# Patient Record
Sex: Female | Born: 1945 | Race: White | Hispanic: No | Marital: Married | Smoking: Former smoker
Health system: Southern US, Community
[De-identification: ages and names within clinical notes are randomized; demographics above are authoritative.]

## PROBLEM LIST (undated history)

## (undated) DIAGNOSIS — I10 Essential (primary) hypertension: Secondary | ICD-10-CM

## (undated) DIAGNOSIS — E119 Type 2 diabetes mellitus without complications: Secondary | ICD-10-CM

## (undated) DIAGNOSIS — M1990 Unspecified osteoarthritis, unspecified site: Secondary | ICD-10-CM

## (undated) DIAGNOSIS — K648 Other hemorrhoids: Secondary | ICD-10-CM

## (undated) DIAGNOSIS — Z973 Presence of spectacles and contact lenses: Secondary | ICD-10-CM

## (undated) DIAGNOSIS — I2510 Atherosclerotic heart disease of native coronary artery without angina pectoris: Secondary | ICD-10-CM

## (undated) DIAGNOSIS — D242 Benign neoplasm of left breast: Secondary | ICD-10-CM

## (undated) DIAGNOSIS — E785 Hyperlipidemia, unspecified: Secondary | ICD-10-CM

## (undated) HISTORY — DX: Essential (primary) hypertension: I10

## (undated) HISTORY — PX: CARDIAC CATHETERIZATION: SHX172

## (undated) HISTORY — DX: Hyperlipidemia, unspecified: E78.5

## (undated) HISTORY — PX: OTHER SURGICAL HISTORY: SHX169

---

## 1968-12-28 HISTORY — PX: LUMBAR SPINE SURGERY: SHX701

## 1978-12-29 HISTORY — PX: ABDOMINAL HYSTERECTOMY: SHX81

## 1993-04-29 HISTORY — PX: LAPAROSCOPIC CHOLECYSTECTOMY: SUR755

## 1997-12-28 ENCOUNTER — Ambulatory Visit (HOSPITAL_COMMUNITY): Admission: RE | Admit: 1997-12-28 | Discharge: 1997-12-28 | Payer: Self-pay | Admitting: Family Medicine

## 1999-08-31 ENCOUNTER — Other Ambulatory Visit: Admission: RE | Admit: 1999-08-31 | Discharge: 1999-08-31 | Payer: Self-pay | Admitting: *Deleted

## 2000-01-31 ENCOUNTER — Encounter: Payer: Self-pay | Admitting: Neurological Surgery

## 2000-01-31 ENCOUNTER — Ambulatory Visit (HOSPITAL_COMMUNITY): Admission: RE | Admit: 2000-01-31 | Discharge: 2000-01-31 | Payer: Self-pay | Admitting: Neurological Surgery

## 2000-02-06 ENCOUNTER — Encounter: Admission: RE | Admit: 2000-02-06 | Discharge: 2000-05-06 | Payer: Self-pay | Admitting: Neurological Surgery

## 2000-03-30 ENCOUNTER — Encounter: Payer: Self-pay | Admitting: Neurological Surgery

## 2000-03-30 ENCOUNTER — Ambulatory Visit (HOSPITAL_COMMUNITY): Admission: RE | Admit: 2000-03-30 | Discharge: 2000-03-30 | Payer: Self-pay | Admitting: Neurological Surgery

## 2000-04-15 ENCOUNTER — Encounter: Payer: Self-pay | Admitting: Neurological Surgery

## 2000-04-17 ENCOUNTER — Encounter: Payer: Self-pay | Admitting: Neurological Surgery

## 2000-04-17 ENCOUNTER — Ambulatory Visit (HOSPITAL_COMMUNITY): Admission: RE | Admit: 2000-04-17 | Discharge: 2000-04-17 | Payer: Self-pay | Admitting: Neurological Surgery

## 2000-05-19 ENCOUNTER — Encounter: Admission: RE | Admit: 2000-05-19 | Discharge: 2000-06-10 | Payer: Self-pay | Admitting: Neurological Surgery

## 2000-09-04 ENCOUNTER — Other Ambulatory Visit: Admission: RE | Admit: 2000-09-04 | Discharge: 2000-09-04 | Payer: Self-pay | Admitting: Family Medicine

## 2001-04-29 HISTORY — PX: PLANTAR FASCIA SURGERY: SHX746

## 2001-11-05 ENCOUNTER — Other Ambulatory Visit: Admission: RE | Admit: 2001-11-05 | Discharge: 2001-11-05 | Payer: Self-pay | Admitting: Family Medicine

## 2002-12-08 ENCOUNTER — Other Ambulatory Visit: Admission: RE | Admit: 2002-12-08 | Discharge: 2002-12-08 | Payer: Self-pay | Admitting: Family Medicine

## 2006-04-09 ENCOUNTER — Ambulatory Visit (HOSPITAL_BASED_OUTPATIENT_CLINIC_OR_DEPARTMENT_OTHER): Admission: RE | Admit: 2006-04-09 | Discharge: 2006-04-09 | Payer: Self-pay | Admitting: Orthopedic Surgery

## 2006-10-08 ENCOUNTER — Other Ambulatory Visit: Admission: RE | Admit: 2006-10-08 | Discharge: 2006-10-08 | Payer: Self-pay | Admitting: Family Medicine

## 2008-05-19 ENCOUNTER — Ambulatory Visit (HOSPITAL_COMMUNITY): Admission: RE | Admit: 2008-05-19 | Discharge: 2008-05-20 | Payer: Self-pay | Admitting: Neurological Surgery

## 2008-05-19 HISTORY — PX: ANTERIOR CERVICAL DECOMP/DISCECTOMY FUSION: SHX1161

## 2008-09-15 ENCOUNTER — Ambulatory Visit (HOSPITAL_BASED_OUTPATIENT_CLINIC_OR_DEPARTMENT_OTHER): Admission: RE | Admit: 2008-09-15 | Discharge: 2008-09-15 | Payer: Self-pay | Admitting: Orthopedic Surgery

## 2009-02-16 ENCOUNTER — Ambulatory Visit (HOSPITAL_BASED_OUTPATIENT_CLINIC_OR_DEPARTMENT_OTHER): Admission: RE | Admit: 2009-02-16 | Discharge: 2009-02-16 | Payer: Self-pay | Admitting: Orthopedic Surgery

## 2009-04-29 HISTORY — PX: CATARACT EXTRACTION W/ INTRAOCULAR LENS  IMPLANT, BILATERAL: SHX1307

## 2010-08-02 LAB — BASIC METABOLIC PANEL
BUN: 28 mg/dL — ABNORMAL HIGH (ref 6–23)
Chloride: 100 mEq/L (ref 96–112)
Glucose, Bld: 157 mg/dL — ABNORMAL HIGH (ref 70–99)
Potassium: 3.6 mEq/L (ref 3.5–5.1)

## 2010-08-07 LAB — BASIC METABOLIC PANEL
BUN: 23 mg/dL (ref 6–23)
Chloride: 103 mEq/L (ref 96–112)
Glucose, Bld: 145 mg/dL — ABNORMAL HIGH (ref 70–99)
Potassium: 3.3 mEq/L — ABNORMAL LOW (ref 3.5–5.1)

## 2010-08-13 LAB — CBC
HCT: 40.3 % (ref 36.0–46.0)
MCV: 83.3 fL (ref 78.0–100.0)
Platelets: 298 10*3/uL (ref 150–400)
RDW: 12.9 % (ref 11.5–15.5)

## 2010-08-13 LAB — BASIC METABOLIC PANEL
BUN: 20 mg/dL (ref 6–23)
CO2: 29 mEq/L (ref 19–32)
Chloride: 97 mEq/L (ref 96–112)
Glucose, Bld: 166 mg/dL — ABNORMAL HIGH (ref 70–99)
Potassium: 3.3 mEq/L — ABNORMAL LOW (ref 3.5–5.1)

## 2010-09-11 NOTE — Op Note (Signed)
[name], [name] NO.:  [id number]   MEDICAL RECORD NO.:  [id number]          PATIENT TYPE:  AMB   LOCATION:  DSC                          FACILITY:  [facility]   PHYSICIAN:  Loreta Ave, M.D. DATE OF BIRTH:  03-06-1946   DATE OF PROCEDURE:  09/15/2008  DATE OF DISCHARGE:                               OPERATIVE REPORT   PREOPERATIVE DIAGNOSES:  Right shoulder impingement, distal clavicle  osteolysis, long head biceps tendinopathy.   POSTOPERATIVE DIAGNOSES:  Right shoulder impingement, distal clavicle  osteolysis, long head biceps tendinopathy, but also with a full-  thickness tear of rotator cuff crescent region supraspinatus tendon.  Also, complex tearing of labrum circumferentially.  Also, some mild  adhesive capsulitis.   PROCEDURE:  Right shoulder exam manipulation under anesthesia.  Arthroscopy and debridement of labrum and rotator cuff.  Bursectomy,  acromioplasty, CA ligament release.  Excision of distal clavicle.  Arthroscopic-assisted rotator cuff repair with FiberWire suture x2,  swivel lock anchors x2.   SURGEON:  Loreta Ave, MD   ASSISTANT:  Genene Churn. Barry Dienes, [place], present throughout the entire case and  necessary for timely completion of procedure.   ANESTHESIA:  General.   BLOOD LOSS:  Minimal.   SPECIMENS:  None.   CULTURES:  None.   COMPLICATIONS:  None.   DRESSING:  Soft compressive shoulder immobilizer.   PROCEDURE:  The patient brought to the operating room, placed on  operating table in supine position.  After adequate anesthesia had been  obtained, shoulder examined.  Fairly good motion, but last 20% forward  flexion and abduction from some mild adhesions.  I gently manipulated  breaking those up, achieving full motion, maintaining stable shoulder.  She was then placed in beach-chair position on the shoulder positioner,  prepped and draped in usual sterile fashion.  Three portals anterior,  posterior, and lateral.   Shoulder entered with blunt obturator.  Arthroscope introduced, shoulder distended and inspected.  Circumferential complex tearing of labrum debrided, most of this  anterosuperior.  Biceps tendon had a little longitudinal split exiting  the shoulder but no inflammation, structurally intact, and no further  treatment of that was really indicated.  Articular cartilage looked  good.  Full-thickness tear, crescent region of supraspinatus tendon.  Debrided back to healthy tissue.  Tuberosity roughened with bur.  Cannula then redirected subacromially.  Type 3 acromion.  Obvious  impingement.  Bursa resected.  Acromioplasty to a type 1 acromion with  shaver and high-speed bur releasing the CA ligament with cautery.  Grade  4 changes with spurs of AC joint.  Periarticular spurs in lateral  centimeter of clavicle resected.  Adequacy of decompression confirmed  viewing from all portals.  The anterior and lateral portals were then  opened, cannula was placed for repair of her cuff.  Utilizing a Scorpion  device, 2 horizontal mattress sutures were placed in the cuff.  These  were brought out laterally and then anchored down into the tuberosity  with swivel lock anchors.  In the front, I was very pleased with but the  back anchor did not give a  secure fixation due to the bone being  relatively soft.  That suture was removed, swivel lock anchor left  buried in the bone.  This was not loose at all and retrieving it would  have done more harm than good.  I reinsert a second horizontal mattress  suture in the back of the cuff and then this was anchored down into the  humerus going over top of the tuberosity into the lateral aspect where  there was better bone.  I prepunched and then fixed with a swivel lock  there.  That did gave good firm fixation.  At completion, I had a nice  firm watertight closure of the cuff confirmed with full passive motion.  Instruments and fluid removed.  Portal of shoulder and  bursa injected  with Marcaine.  Portal was closed with 4-0 nylon.  Sterile compressive  dressing applied.  Shoulder immobilizer applied.  Anesthesia reversed.  Brought to recovery room.  Tolerated surgery well.  No complications.      Loreta Ave, M.D.  Electronically Signed     DFM/MEDQ  D:  09/15/2008  T:  09/16/2008  Job:  [id number]

## 2010-09-11 NOTE — Op Note (Signed)
[name], [name] NO.:  [id number]   MEDICAL RECORD NO.:  [id number]          PATIENT TYPE:  OIB   LOCATION:  3526                         FACILITY:  [facility]   PHYSICIAN:  Stefani Dama, M.D.  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   PROCEDURES:  Anterior cervical decompression C6-C7 arthrodesis with  structural allograft, Alphatec plate fixation at C6-C7.   SURGEON:  Stefani Dama, MD   FIRST ASSISTANT:  Clydene Fake, MD   ANESTHESIA:  General endotracheal.   INDICATIONS:  [name] is a 65 year old individual who has had  significant neck, shoulder, and right arm pain.  She has evidence of  herniated nucleus pulposus at C6-C7 on the right side.  She was noted to  have spondylosis at the C6-C7.  She is advised regarding anterior  cervical decompression arthrodesis.   PROCEDURE:  The patient was brought to the operating room and placed on  table in supine position.  After smooth induction of general  endotracheal anesthesia, she was placed in 5 pounds of halter traction.  The neck was prepped with alcohol and DuraPrep and draped in sterile  fashion.  A transverse incision was made in left side of the neck and  dissection was carried down through the platysma.  The plane between the  sternocleidomastoid and strap muscles were dissected bluntly until the  prevertebral space was reached, first identifiable disk space was noted  be that of C6-C7.  Dissection was then carried down under longus coli  muscle and self-retaining Caspar retractor was placed in the wound and  the anterior border of C6-C7 was opened  with #15 blade and a combination  of Kerrison rongeurs was used to evacuate a significant quantity of  severely desiccated disk material.  Osteophytes were noted on the  inferior margin body of C6.  Uncinate process spur was noted be  prominent on the right side at C6-C7.  These were drilled down, then  removed initially with 2-mm Kerrison punch followed by a larger 2-mm  Kerrison punch.  The lateral recess was well decompressed and the C7  nerve root was freed on both the right and left side.  Once this was  accomplished, hemostasis in the interspace was achieved.  A 5-mm Barrel  Bit was used to shape the endplates and smooth the edges.  A 7-mm  transgraft was then shaped informed to the interspaces configuration,  filled with demineralized bone matrix and then placed into the  interspace and countersunk approximately 2 mm.  Traction was removed at  this point and a standard-sized 14-mm Alphatec plate was fixed to the  ventral aspect of the vertebral bodies with four variable angled 4 x 14  mm screws.  Once these were placed, final localizing radiograph  identified  good position  of the surgical construct.  Platysma was closed with 3-0  Vicryl interrupted fashion and 3-0 Vicryl and the subcuticular tissues  and the Dermabond was placed on the skin.  Blood loss was estimated 30  mL.  The patient tolerated the procedure well.      Stefani Dama, M.D.  Electronically Signed     HJE/MEDQ  D:  05/19/2008  T:  05/20/2008  Job:  [id number]

## 2010-09-14 NOTE — Op Note (Signed)
[name], ANDRES NO.:  [id number]   MEDICAL RECORD NO.:  [id number]          PATIENT TYPE:  AMB   LOCATION:  DSC                          FACILITY:  [facility]   PHYSICIAN:  Loreta Ave, M.D. DATE OF BIRTH:  Mar 09, 1946   DATE OF PROCEDURE:  04/09/2006  DATE OF DISCHARGE:                               OPERATIVE REPORT   PREOPERATIVE DIAGNOSIS:  Recalcitrant chronic right plantar fasciitis.   POSTOPERATIVE DIAGNOSIS:  Recalcitrant chronic right plantar fasciitis.   DESCRIPTION OF PROCEDURE:  Endoscopic plantar fascial release, right  heel.   SURGEON:  Loreta Ave, M.D.   ASSISTANT SURGEON:  Genene Churn. Denton Meek.   ANESTHESIA:  General.   ESTIMATED BLOOD LOSS:  Minimal.   TOURNIQUET TIME:  30 minutes.   SPECIMENS:  None.   CULTURES:  None.   COMPLICATIONS:  None.   DRESSING:  Soft compressive.   DESCRIPTION OF PROCEDURE:  The patient was brought to the operating room  and, after adequate anesthesia had been obtained, a tourniquet was  applied, right calf.  Prepped and draped in the usual sterile fashion,  exsanguinated, elevation, Esmarch, tourniquet inflated to 250 mm Hg.  Fluoroscopic localization of the plantar fascia attachment to the os  calcis.  A small incision was made on the medial and lateral aspects.  Tissue cleared on the plantar side of the plantar fascia with a small  spatula, and then the cannula inserted for endoscopic release.  Utilizing arthroscopic guidance and the endoscopic instruments, the  plantar fascia was isolated and released at its attachment off the os  calcis in its entirety from medial to lateral aspect all the way down to  muscle layer below.  Neurovascular structures were protected.  Confirmed  complete release.  The wound was irrigated.  Injected with  Marcaine and closed with nylon.  A sterile compressive dressing was  applied.  The tourniquet was deflated and removed.  Anesthesia was  reversed.  The  patient was brought to the recovery room.  The patient  tolerated the surgery well with no complications.      Loreta Ave, M.D.  Electronically Signed     DFM/MEDQ  D:  04/09/2006  T:  04/10/2006  Job:  [id number]

## 2010-12-19 ENCOUNTER — Encounter (INDEPENDENT_AMBULATORY_CARE_PROVIDER_SITE_OTHER): Payer: Self-pay | Admitting: General Surgery

## 2010-12-24 ENCOUNTER — Ambulatory Visit (INDEPENDENT_AMBULATORY_CARE_PROVIDER_SITE_OTHER): Payer: Self-pay | Admitting: General Surgery

## 2011-01-07 ENCOUNTER — Ambulatory Visit (INDEPENDENT_AMBULATORY_CARE_PROVIDER_SITE_OTHER): Payer: Medicare Other | Admitting: General Surgery

## 2011-01-07 ENCOUNTER — Encounter (INDEPENDENT_AMBULATORY_CARE_PROVIDER_SITE_OTHER): Payer: Self-pay | Admitting: General Surgery

## 2011-01-07 VITALS — BP 128/86 | HR 72 | Temp 98.1°F | Ht 63.0 in | Wt 162.2 lb

## 2011-01-07 DIAGNOSIS — K648 Other hemorrhoids: Secondary | ICD-10-CM

## 2011-01-07 NOTE — Progress Notes (Signed)
Chief Complaint  Patient presents with  . Other    new pt- eval hems and anal fissure    HPI [name] is a 65 y.o. female.    [name] is referred to me by Dr. Laurann Montana for management of hemorrhoids.  The patient has had intermittent hemorrhoid symptoms for 40 years. She has not had any surgical interventions. The last 5 years have been worse. She is not constipated. She has episodes of diarrhea after eating nuts and this causes bleeding. She has a little bit of mild discomfort. This occurs every 2-4 weeks and will last 2 or 3 days and then she is normal in between. Recent stool Hemoccult cards were actually negative. She has had colonoscopy periodically.  She was referred because she is becoming more frustrated with recurrent symptoms. She has tried suppositories was used to help but now not as much. HPI  Past Medical History  Diagnosis Date  . Raynauds phenomenon   . Hypertension   . Hyperlipidemia   . Glaucoma   . Diverticulosis of colon   . Vitamin D deficiency   . Hemorrhoids     Past Surgical History  Procedure Date  . Rotator cuff repair 2011    bilateral  . Cevical fusion   . Abdominal hysterectomy   . Lumbar spine surgery   . Glaucoma surgery     bilateral  . Cholecystectomy 2002?    Family History  Problem Relation Age of Onset  . Hypertension Father   . Stroke Father   . Hypertension Sister   . Heart disease Brother   . Hypertension Brother   . Heart disease Brother   . Heart disease Sister   . Heart disease Sister   . Cancer Sister     breast    Social History History  Substance Use Topics  . Smoking status: Former Games developer  . Smokeless tobacco: Not on file  . Alcohol Use: No    Allergies  Allergen Reactions  . Vicodin (Hydrocodone-Acetaminophen) Swelling  . Crestor (Rosuvastatin Calcium)   . Lipitor (Atorvastatin Calcium)     Current Outpatient Prescriptions  Medication Sig Dispense Refill  . Colesevelam HCl 3.75 G PACK Take  by mouth.        Marland Kitchen lisinopril-hydrochlorothiazide (PRINZIDE,ZESTORETIC) 10-12.5 MG per tablet daily.      Marland Kitchen triamterene-hydrochlorothiazide (MAXZIDE) 75-50 MG per tablet Take 1 tablet by mouth daily.        . Vitamin D, Ergocalciferol, (DRISDOL) 50000 UNITS CAPS Take 50,000 Units by mouth.          Review of Systems Review of Systems  Constitutional: Negative.   HENT: Negative.   Eyes: Negative.   Respiratory: Negative.   Cardiovascular: Negative.   Gastrointestinal: Positive for diarrhea, anal bleeding and rectal pain. Negative for nausea, vomiting, abdominal pain, constipation, blood in stool and abdominal distention.  Genitourinary: Negative.   Musculoskeletal: Negative.        Raynaud's disease. Pos. Rheumatoid factor  Skin: Negative.   Neurological: Negative.   Hematological: Negative.   Psychiatric/Behavioral: Negative.     Blood pressure 128/86, pulse 72, temperature 98.1 F (36.7 C), temperature source Temporal, height 5\' 3"  (1.6 m), weight 162 lb 3.2 oz (73.573 kg).  Physical Exam Physical Exam  Constitutional: She is oriented to person, place, and time. She appears well-developed and well-nourished. No distress.  HENT:  Head: Normocephalic and atraumatic.  Nose: Nose normal.  Mouth/Throat: No oropharyngeal exudate.  Eyes: Conjunctivae and EOM are normal. No  scleral icterus.  Neck: Normal range of motion. Neck supple. No JVD present. No tracheal deviation present. No thyromegaly present.  Cardiovascular: Normal rate, regular rhythm, normal heart sounds and intact distal pulses.   No murmur heard. Pulmonary/Chest: Effort normal and breath sounds normal. No respiratory distress. She has no wheezes. She has no rales. She exhibits no tenderness.  Abdominal: Bowel sounds are normal. She exhibits no distension and no mass. There is no tenderness. There is no rebound and no guarding.    Genitourinary:     Musculoskeletal: Normal range of motion. She exhibits no edema  and no tenderness.  Lymphadenopathy:    She has no cervical adenopathy.  Neurological: She is alert and oriented to person, place, and time.  Skin: Skin is warm and dry. No rash noted. She is not diaphoretic. No erythema. No pallor.  Psychiatric: She has a normal mood and affect. Her behavior is normal. Judgment and thought content normal.    Data Reviewed I reviewed in Dr. Lucilla Lame office notes.  Assessment    Internal hemorrhoids with bleeding, injected today.  External hemorrhoids, without complication.  Hypertension.  Rate also, with positive rheumatoid factor.  Status post open hysterectomy without oophorectomy for fibroids, complicated by a bladder perforation which required repair.  History laparoscopic cholecystectomy. Details unknown.    Plan    Internal hemorrhoids injected with sclerosing solution today without apparent complication.  Patient information booklet given the patient and discussed.  Return to see me p.r.n. further symptoms.       [name] M 01/07/2011, 9:23 AM

## 2011-01-07 NOTE — Patient Instructions (Addendum)
I think that your bleeding is due to internal hemorrhoids. These were injected with sclerosing solution today. This should resolve the bleeding. Keep her stool soft. Return to see me if there are any recurrent problems. Discuss scheduling of future colonoscopy with your primary care physician.

## 2013-03-15 ENCOUNTER — Other Ambulatory Visit: Payer: Self-pay | Admitting: Family Medicine

## 2013-03-15 DIAGNOSIS — R0989 Other specified symptoms and signs involving the circulatory and respiratory systems: Secondary | ICD-10-CM

## 2013-03-22 ENCOUNTER — Ambulatory Visit
Admission: RE | Admit: 2013-03-22 | Discharge: 2013-03-22 | Disposition: A | Discharge status: Home / Self Care | Payer: Medicare Other | Source: Ambulatory Visit | Attending: Family Medicine | Admitting: Family Medicine

## 2013-03-22 DIAGNOSIS — R0989 Other specified symptoms and signs involving the circulatory and respiratory systems: Secondary | ICD-10-CM

## 2014-02-21 ENCOUNTER — Other Ambulatory Visit (INDEPENDENT_AMBULATORY_CARE_PROVIDER_SITE_OTHER): Payer: Self-pay | Admitting: General Surgery

## 2014-02-21 NOTE — H&P (Signed)
[name] 02/21/2014 1:35 PM Location: [facility] Patient #: [id number] DOB: 30-Sep-1945 Married / Language: [name] / Race: White Female History of Present Illness [name]; 22/97/9892 2:22 PM) Patient words: bleeding hems.  The patient is a 68 year old female who presents with a complaint of rectal bleeding. The onset of the rectal bleeding has been gradual and has been occurring in a persistent pattern. The course has been increasing. She reports several episodes a week. The rectal bleeding is characterized as blood mixed in stools. Her stools are mostly loose. She denies any constipation. Her fiber intake is dietary. There has been no associated abdominal pain, change in bowel habits or change in caliber of stools. She underwent proctoscopy and sclerosing by Dr Dalbert Batman 3 yrs ago with little results. Most recent colonoscopy several months ago was negative. Other Problems [name]; 02/21/2014 1:38 PM) Back Pain Diabetes Mellitus Hemorrhoids High blood pressure Hypercholesterolemia  Past Surgical History [name]; 02/21/2014 1:38 PM) Cataract Surgery Bilateral. Colon Polyp Removal - Colonoscopy Foot Surgery Right. Gallbladder Surgery - Laparoscopic Hysterectomy (not due to cancer) - Partial Shoulder Surgery Bilateral. Spinal Surgery - Lower Back Spinal Surgery - Neck  Diagnostic Studies History [name]; 02/21/2014 1:38 PM) Colonoscopy within last year Mammogram within last year Pap Smear >5 years ago  Allergies [name]; 02/21/2014 1:36 PM) Vicodin *ANALGESICS - OPIOID* Lipitor *ANTIHYPERLIPIDEMICS* Crestor *ANTIHYPERLIPIDEMICS*  Medication History [name]; 02/21/2014 1:38 PM) Vitamin D (Ergocalciferol) (50000UNIT Capsule, Oral weekly) Active. Neomycin-Polymyxin-HC (3.5-10000-1 Suspension, Otic as needed) Active. Lisinopril-Hydrochlorothiazide (20-12.5MG  Tablet, Oral daily)  Active. Diltiazem CD (240MG  Capsule ER 24HR, Oral) Active. Zetia (10MG  Tablet, Oral daily) Active. Baby Aspirin (81MG  Tablet Chewable, Oral) Active.  Social History [name]; 02/21/2014 1:38 PM) Alcohol use Occasional alcohol use. Caffeine use Coffee, Tea. No drug use Tobacco use Former smoker.  Family History [name]; 02/21/2014 1:38 PM) Alcohol Abuse Brother. Breast Cancer Sister. Cerebrovascular Accident Father. Heart Disease Brother, Father, Sister. Heart disease in female family member before age 47 Hypertension Father, Sister. Melanoma Sister.  Pregnancy / Birth History [name]; 02/21/2014 1:38 PM) Age at menarche 17 years. Age of menopause 65-50 Contraceptive History Intrauterine device. Gravida 1 Maternal age 74-25 Para 1 Regular periods     Review of Systems Apolonio Schneiders Oliver; 02/21/2014 1:38 PM) General Not Present- Appetite Loss, Chills, Fatigue, Fever, Night Sweats, Weight Gain and Weight Loss. Skin Present- Dryness. Not Present- Change in Wart/Mole, Hives, Jaundice, New Lesions, Non-Healing Wounds, Rash and Ulcer. HEENT Present- Seasonal Allergies and Wears glasses/contact lenses. Not Present- Earache, Hearing Loss, Hoarseness, Nose Bleed, Oral Ulcers, Ringing in the Ears, Sinus Pain, Sore Throat, Visual Disturbances and Yellow Eyes. Respiratory Present- Snoring. Not Present- Bloody sputum, Chronic Cough, Difficulty Breathing and Wheezing. Breast Not Present- Breast Mass, Breast Pain, Nipple Discharge and Skin Changes. Cardiovascular Not Present- Chest Pain, Difficulty Breathing Lying Down, Leg Cramps, Palpitations, Rapid Heart Rate, Shortness of Breath and Swelling of Extremities. Gastrointestinal Present- Hemorrhoids and Rectal Pain. Not Present- Abdominal Pain, Bloating, Bloody Stool, Change in Bowel Habits, Chronic diarrhea, Constipation, Difficulty Swallowing, Excessive gas, Gets full quickly at meals, Indigestion,  Nausea and Vomiting. Female Genitourinary Present- Frequency. Not Present- Nocturia, Painful Urination, Pelvic Pain and Urgency. Musculoskeletal Present- Joint Stiffness and Muscle Pain. Not Present- Back Pain, Joint Pain, Muscle Weakness and Swelling of Extremities. Neurological Not Present- Decreased Memory, Fainting, Headaches, Numbness, Seizures, Tingling, Tremor, Trouble walking and Weakness. Psychiatric Not Present- Anxiety, Bipolar, Change in Sleep Pattern, Depression, Fearful and Frequent crying.  Endocrine Present- New Diabetes. Not Present- Cold Intolerance, Excessive Hunger, Hair Changes, Heat Intolerance and Hot flashes. Hematology Not Present- Easy Bruising, Excessive bleeding, Gland problems, HIV and Persistent Infections.  Vitals Apolonio Schneiders [place]; 02/21/2014 1:39 PM) 02/21/2014 1:39 PM Weight: 153 lb Height: 63in Body Surface Area: 1.76 m Body Mass Index: 27.1 kg/m Temp.: 74F(Tympanic)  Pulse: 66 (Regular)  BP: 120/72 (Sitting, Left Arm, Standard)     Physical Exam [name]; 57/84/6962 2:24 PM)  General Mental Status-Alert. General Appearance-Not in acute distress. Orientation-Oriented X3. Hydration-Well hydrated. Voice-Normal.  Integumentary Global Assessment Upon inspection and palpation of skin surfaces of the - Axillae: non-tender, no inflammation or ulceration, no drainage. and Distribution of scalp and body hair is normal. General Characteristics Temperature - normal warmth is noted.  Head and Neck Head-normocephalic, atraumatic with no lesions or palpable masses. Face Global Assessment - atraumatic, no absence of expression. Neck Global Assessment - no abnormal movements, no bruit auscultated on the right, no bruit auscultated on the left, no decreased range of motion, non-tender. Trachea-midline. Thyroid Gland Characteristics - non-tender.  ENMT Nose and Sinuses External Inspection of the Nose - no destructive  lesion observed. Inspection of the nares - Left - quiet respiration. Right - quiet respiration. Mouth and Throat Lips - Upper Lip - no fissures observed, no pallor noted. Lower Lip - no fissures observed, no pallor noted. Nasopharynx - no discharge present. Oral Cavity/Oropharynx - Tongue - no dryness observed. Oral Mucosa - no cyanosis observed. Hypopharynx - no evidence of airway distress observed.  Chest and Lung Exam Inspection Movements - Normal and Symmetrical. Accessory muscles - No use of accessory muscles in breathing. Palpation Palpation of the chest reveals - Non-tender. Auscultation Breath sounds - Normal and Clear.  Cardiovascular Auscultation Rhythm - Regular. Murmurs & Other Heart Sounds - Auscultation of the heart reveals - No Murmurs and No Systolic Clicks.  Abdomen Inspection Inspection of the abdomen reveals - No Visible peristalsis and No Abnormal pulsations. Palpation/Percussion Palpation and Percussion of the abdomen reveal - Soft, Non Tender, No Rebound tenderness, No Rigidity (guarding) and No Cutaneous hyperesthesia.  Rectal Anorectal Exam External - normal external exam. Internal - normal internal exam. Proctoscopic exam -Note:see procedure note.   Peripheral Vascular Upper Extremity Inspection - Left - No Cyanotic nailbeds, Not Ischemic. Right - No Cyanotic nailbeds, Not Ischemic.  Neurologic Neurologic evaluation reveals -normal attention span and ability to concentrate, able to name objects and repeat phrases. Appropriate fund of knowledge , normal sensation and normal coordination. Mental Status Affect - not angry, not paranoid. Cranial Nerves-Normal Bilaterally. Gait-Normal.  Neuropsychiatric Mental status exam performed with findings of-able to articulate well with normal speech/language, rate, volume and coherence, thought content normal with ability to perform basic computations and apply abstract reasoning and no evidence of  hallucinations, delusions, obsessions or homicidal/suicidal ideation.    Assessment & Plan [name]; 95/28/4132 2:22 PM)  BLEEDING INTERNAL HEMORRHOIDS (455.2  K64.8) Story: this is a 68 year old female with bleeding internal hemorrhoids. Recent colonoscopy has been negative for any other source of bleeding. She reports mainly loose stools. She denies any constipation or frank diarrhea. She's tried fiber supplements and rectal suppositories with no relief in symptoms. Impression: on exam she has a grade 2 inflamed internal hemorrhoid. We discussed office banding procedures versus operative procedures. After discussing the risks and benefits of both, she has decided to undergo an operative hemorrhoidectomy. Risks include bleeding, pain and recurrence.  Current Plans Pt Education - [facility] Hemorrhoids (  AT) ANOSCOPY, DIAGNOSTIC (91694)  Hemorrhoids Procedure  Other: Procedure: Anoscopy Surgeon: Marcello Moores Assistant: Juliane Lack After the risks and benefits were explained, verbal consent was obtained for above procedure Anesthesia: none Diagnosis: rectal bleeding Findings: Grade 2 R posterior internal hemorrhoids with signs of inflammation and bleeding

## 2014-02-28 ENCOUNTER — Encounter (HOSPITAL_BASED_OUTPATIENT_CLINIC_OR_DEPARTMENT_OTHER): Payer: Self-pay | Admitting: *Deleted

## 2014-03-02 ENCOUNTER — Other Ambulatory Visit: Payer: Self-pay

## 2014-03-02 ENCOUNTER — Encounter (HOSPITAL_BASED_OUTPATIENT_CLINIC_OR_DEPARTMENT_OTHER)
Admission: RE | Disposition: A | Discharge status: Home / Self Care | Payer: Self-pay | Source: Ambulatory Visit | Attending: General Surgery

## 2014-03-02 ENCOUNTER — Ambulatory Visit (HOSPITAL_BASED_OUTPATIENT_CLINIC_OR_DEPARTMENT_OTHER): Payer: Medicare Other | Admitting: Anesthesiology

## 2014-03-02 ENCOUNTER — Emergency Department (HOSPITAL_COMMUNITY): Payer: Medicare Other

## 2014-03-02 ENCOUNTER — Ambulatory Visit (HOSPITAL_BASED_OUTPATIENT_CLINIC_OR_DEPARTMENT_OTHER)
Admission: RE | Admit: 2014-03-02 | Discharge: 2014-03-02 | Disposition: A | Discharge status: Home / Self Care | Payer: Medicare Other | Source: Ambulatory Visit | Attending: General Surgery | Admitting: General Surgery

## 2014-03-02 ENCOUNTER — Encounter (HOSPITAL_COMMUNITY): Payer: Self-pay

## 2014-03-02 ENCOUNTER — Inpatient Hospital Stay (HOSPITAL_COMMUNITY)
Admission: EM | Admit: 2014-03-02 | Discharge: 2014-03-03 | DRG: 989 | Disposition: A | Discharge status: Home / Self Care | Payer: Medicare Other | Attending: Internal Medicine | Admitting: Internal Medicine

## 2014-03-02 ENCOUNTER — Encounter (HOSPITAL_BASED_OUTPATIENT_CLINIC_OR_DEPARTMENT_OTHER): Payer: Self-pay | Admitting: Anesthesiology

## 2014-03-02 DIAGNOSIS — G459 Transient cerebral ischemic attack, unspecified: Secondary | ICD-10-CM | POA: Diagnosis present

## 2014-03-02 DIAGNOSIS — E559 Vitamin D deficiency, unspecified: Secondary | ICD-10-CM | POA: Diagnosis present

## 2014-03-02 DIAGNOSIS — E669 Obesity, unspecified: Secondary | ICD-10-CM | POA: Diagnosis present

## 2014-03-02 DIAGNOSIS — E119 Type 2 diabetes mellitus without complications: Secondary | ICD-10-CM | POA: Diagnosis present

## 2014-03-02 DIAGNOSIS — Z6827 Body mass index (BMI) 27.0-27.9, adult: Secondary | ICD-10-CM

## 2014-03-02 DIAGNOSIS — I639 Cerebral infarction, unspecified: Secondary | ICD-10-CM | POA: Diagnosis present

## 2014-03-02 DIAGNOSIS — E1169 Type 2 diabetes mellitus with other specified complication: Secondary | ICD-10-CM

## 2014-03-02 DIAGNOSIS — Z7982 Long term (current) use of aspirin: Secondary | ICD-10-CM

## 2014-03-02 DIAGNOSIS — E785 Hyperlipidemia, unspecified: Secondary | ICD-10-CM | POA: Diagnosis present

## 2014-03-02 DIAGNOSIS — Z823 Family history of stroke: Secondary | ICD-10-CM

## 2014-03-02 DIAGNOSIS — Z9841 Cataract extraction status, right eye: Secondary | ICD-10-CM | POA: Diagnosis not present

## 2014-03-02 DIAGNOSIS — R200 Anesthesia of skin: Secondary | ICD-10-CM

## 2014-03-02 DIAGNOSIS — Z9842 Cataract extraction status, left eye: Secondary | ICD-10-CM | POA: Diagnosis not present

## 2014-03-02 DIAGNOSIS — I10 Essential (primary) hypertension: Secondary | ICD-10-CM | POA: Diagnosis present

## 2014-03-02 DIAGNOSIS — Z961 Presence of intraocular lens: Secondary | ICD-10-CM | POA: Diagnosis present

## 2014-03-02 DIAGNOSIS — K648 Other hemorrhoids: Secondary | ICD-10-CM | POA: Diagnosis present

## 2014-03-02 DIAGNOSIS — Z87891 Personal history of nicotine dependence: Secondary | ICD-10-CM | POA: Diagnosis not present

## 2014-03-02 DIAGNOSIS — Z90710 Acquired absence of both cervix and uterus: Secondary | ICD-10-CM

## 2014-03-02 HISTORY — DX: Type 2 diabetes mellitus without complications: E11.9

## 2014-03-02 HISTORY — DX: Other hemorrhoids: K64.8

## 2014-03-02 HISTORY — DX: Presence of spectacles and contact lenses: Z97.3

## 2014-03-02 HISTORY — PX: HEMORRHOID SURGERY: SHX153

## 2014-03-02 LAB — CBC
HEMATOCRIT: 45 % (ref 36.0–46.0)
Hemoglobin: 15.3 g/dL — ABNORMAL HIGH (ref 12.0–15.0)
MCH: 28.7 pg (ref 26.0–34.0)
MCHC: 34 g/dL (ref 30.0–36.0)
MCV: 84.4 fL (ref 78.0–100.0)
Platelets: 213 10*3/uL (ref 150–400)
RBC: 5.33 MIL/uL — ABNORMAL HIGH (ref 3.87–5.11)
RDW: 12.8 % (ref 11.5–15.5)
WBC: 8.6 10*3/uL (ref 4.0–10.5)

## 2014-03-02 LAB — RAPID URINE DRUG SCREEN, HOSP PERFORMED
Amphetamines: NOT DETECTED
BARBITURATES: NOT DETECTED
BENZODIAZEPINES: NOT DETECTED
Cocaine: NOT DETECTED
Opiates: NOT DETECTED
TETRAHYDROCANNABINOL: NOT DETECTED

## 2014-03-02 LAB — POCT I-STAT, CHEM 8
BUN: 21 mg/dL (ref 6–23)
CREATININE: 0.8 mg/dL (ref 0.50–1.10)
Calcium, Ion: 1.29 mmol/L (ref 1.13–1.30)
Chloride: 104 mEq/L (ref 96–112)
Glucose, Bld: 124 mg/dL — ABNORMAL HIGH (ref 70–99)
HCT: 51 % — ABNORMAL HIGH (ref 36.0–46.0)
Hemoglobin: 17.3 g/dL — ABNORMAL HIGH (ref 12.0–15.0)
POTASSIUM: 3.6 meq/L — AB (ref 3.7–5.3)
Sodium: 144 mEq/L (ref 137–147)
TCO2: 26 mmol/L (ref 0–100)

## 2014-03-02 LAB — BASIC METABOLIC PANEL
Anion gap: 14 (ref 5–15)
BUN: 24 mg/dL — AB (ref 6–23)
CALCIUM: 9.9 mg/dL (ref 8.4–10.5)
CO2: 25 mEq/L (ref 19–32)
CREATININE: 1.05 mg/dL (ref 0.50–1.10)
Chloride: 100 mEq/L (ref 96–112)
GFR calc Af Amer: 61 mL/min — ABNORMAL LOW (ref >90)
GFR, EST NON AFRICAN AMERICAN: 53 mL/min — AB (ref >90)
GLUCOSE: 316 mg/dL — AB (ref 70–99)
Potassium: 4 mEq/L (ref 3.7–5.3)
Sodium: 139 mEq/L (ref 137–147)

## 2014-03-02 LAB — GLUCOSE, CAPILLARY: Glucose-Capillary: 98 mg/dL (ref 70–99)

## 2014-03-02 LAB — I-STAT TROPONIN, ED: Troponin i, poc: 0 ng/mL (ref 0.00–0.08)

## 2014-03-02 SURGERY — HEMORRHOIDECTOMY
Anesthesia: Monitor Anesthesia Care | Site: Rectum

## 2014-03-02 MED ORDER — KETOROLAC TROMETHAMINE 30 MG/ML IJ SOLN
INTRAMUSCULAR | Status: DC | PRN
  Administered 2014-03-02: 15 mg via INTRAVENOUS

## 2014-03-02 MED ORDER — LIDOCAINE 5 % EX OINT
TOPICAL_OINTMENT | CUTANEOUS | Status: DC | PRN
  Administered 2014-03-02: 1

## 2014-03-02 MED ORDER — SODIUM CHLORIDE 0.9 % IJ SOLN
3.0000 mL | INTRAMUSCULAR | Status: DC | PRN
  Filled 2014-03-02: qty 3

## 2014-03-02 MED ORDER — HEPARIN SODIUM (PORCINE) 5000 UNIT/ML IJ SOLN
5000.0000 [IU] | Freq: Three times a day (TID) | INTRAMUSCULAR | Status: DC
  Filled 2014-03-02: qty 1

## 2014-03-02 MED ORDER — ASPIRIN EC 81 MG PO TBEC
81.0000 mg | DELAYED_RELEASE_TABLET | Freq: Every morning | ORAL | Status: DC
  Administered 2014-03-03: 81 mg via ORAL
  Filled 2014-03-02: qty 1

## 2014-03-02 MED ORDER — SENNOSIDES-DOCUSATE SODIUM 8.6-50 MG PO TABS: 1.0000 | ORAL_TABLET | Freq: Every evening | ORAL | Status: DC | PRN

## 2014-03-02 MED ORDER — FENTANYL CITRATE 0.05 MG/ML IJ SOLN
INTRAMUSCULAR | Status: DC | PRN
  Administered 2014-03-02: 100 ug via INTRAVENOUS

## 2014-03-02 MED ORDER — TRAMADOL HCL 50 MG PO TABS
50.0000 mg | ORAL_TABLET | Freq: Once | ORAL | Status: AC
  Administered 2014-03-02: 50 mg via ORAL
  Filled 2014-03-02: qty 1

## 2014-03-02 MED ORDER — TRAMADOL HCL 50 MG PO TABS
50.0000 mg | ORAL_TABLET | Freq: Four times a day (QID) | ORAL | Status: DC | PRN
  Administered 2014-03-03: 100 mg via ORAL
  Filled 2014-03-02: qty 2

## 2014-03-02 MED ORDER — EZETIMIBE 10 MG PO TABS
10.0000 mg | ORAL_TABLET | Freq: Every morning | ORAL | Status: DC
  Administered 2014-03-03: 10 mg via ORAL
  Filled 2014-03-02: qty 1

## 2014-03-02 MED ORDER — ONDANSETRON HCL 4 MG/2ML IJ SOLN
INTRAMUSCULAR | Status: DC | PRN
  Administered 2014-03-02: 4 mg via INTRAVENOUS

## 2014-03-02 MED ORDER — PROMETHAZINE HCL 25 MG/ML IJ SOLN
6.2500 mg | INTRAMUSCULAR | Status: DC | PRN
  Filled 2014-03-02: qty 1

## 2014-03-02 MED ORDER — LIDOCAINE HCL (CARDIAC) 20 MG/ML IV SOLN
INTRAVENOUS | Status: DC | PRN
  Administered 2014-03-02: 60 mg via INTRAVENOUS

## 2014-03-02 MED ORDER — SODIUM CHLORIDE 0.9 % IJ SOLN
3.0000 mL | Freq: Two times a day (BID) | INTRAMUSCULAR | Status: DC
  Filled 2014-03-02: qty 3

## 2014-03-02 MED ORDER — SODIUM CHLORIDE 0.9 % IR SOLN
Status: DC | PRN
Start: 2014-03-02 — End: 2014-03-02
  Administered 2014-03-02: 500 mL

## 2014-03-02 MED ORDER — PROPOFOL 10 MG/ML IV BOLUS
INTRAVENOUS | Status: DC | PRN
  Administered 2014-03-02: 40 mg via INTRAVENOUS

## 2014-03-02 MED ORDER — BUPIVACAINE-EPINEPHRINE 0.5% -1:200000 IJ SOLN
INTRAMUSCULAR | Status: DC | PRN
  Administered 2014-03-02: 30 mL

## 2014-03-02 MED ORDER — ACETAMINOPHEN 10 MG/ML IV SOLN
INTRAVENOUS | Status: DC | PRN
  Administered 2014-03-02: 1000 mg via INTRAVENOUS

## 2014-03-02 MED ORDER — FENTANYL CITRATE 0.05 MG/ML IJ SOLN
25.0000 ug | INTRAMUSCULAR | Status: DC | PRN
  Filled 2014-03-02: qty 1

## 2014-03-02 MED ORDER — VITAMIN D3 25 MCG (1000 UNIT) PO TABS: 5000.0000 [IU] | ORAL_TABLET | ORAL | Status: DC

## 2014-03-02 MED ORDER — LACTATED RINGERS IV SOLN
INTRAVENOUS | Status: DC
  Administered 2014-03-02: 10:00:00 via INTRAVENOUS
  Filled 2014-03-02: qty 1000

## 2014-03-02 MED ORDER — FENTANYL CITRATE 0.05 MG/ML IJ SOLN
INTRAMUSCULAR | Status: AC
  Filled 2014-03-02: qty 4

## 2014-03-02 MED ORDER — STROKE: EARLY STAGES OF RECOVERY BOOK
Freq: Once | Status: DC
  Filled 2014-03-02: qty 1

## 2014-03-02 MED ORDER — TRAMADOL HCL 50 MG PO TABS: 50.0000 mg | ORAL_TABLET | Freq: Four times a day (QID) | ORAL | Status: DC | PRN

## 2014-03-02 MED ORDER — SODIUM CHLORIDE 0.9 % IV SOLN
250.0000 mL | INTRAVENOUS | Status: DC | PRN
  Filled 2014-03-02: qty 250

## 2014-03-02 MED ORDER — PROPOFOL 10 MG/ML IV EMUL
INTRAVENOUS | Status: DC | PRN
  Administered 2014-03-02: 50 ug/kg/min via INTRAVENOUS

## 2014-03-02 MED ORDER — DEXAMETHASONE SODIUM PHOSPHATE 4 MG/ML IJ SOLN
INTRAMUSCULAR | Status: DC | PRN
  Administered 2014-03-02: 10 mg via INTRAVENOUS

## 2014-03-02 SURGICAL SUPPLY — 40 items
BLADE HEX COATED 2.75 (ELECTRODE) ×2 IMPLANT
BLADE SURG 10 STRL SS (BLADE) IMPLANT
BRIEF STRETCH FOR OB PAD LRG (UNDERPADS AND DIAPERS) ×2 IMPLANT
CLOTH BEACON ORANGE TIMEOUT ST (SAFETY) ×2 IMPLANT
COVER MAYO STAND STRL (DRAPES) ×2 IMPLANT
COVER TABLE BACK 60X90 (DRAPES) ×2 IMPLANT
DRAPE PED LAPAROTOMY (DRAPES) IMPLANT
DRAPE UTILITY XL STRL (DRAPES) ×2 IMPLANT
DRSG PAD ABDOMINAL 8X10 ST (GAUZE/BANDAGES/DRESSINGS) ×2 IMPLANT
ELECT BLADE 6.5 .24CM SHAFT (ELECTRODE) IMPLANT
ELECT REM PT RETURN 9FT ADLT (ELECTROSURGICAL) ×2
ELECTRODE REM PT RTRN 9FT ADLT (ELECTROSURGICAL) ×1 IMPLANT
GAUZE SPONGE 4X4 16PLY XRAY LF (GAUZE/BANDAGES/DRESSINGS) ×2 IMPLANT
GLOVE BIO SURGEON STRL SZ 6.5 (GLOVE) ×2 IMPLANT
GLOVE INDICATOR 7.0 STRL GRN (GLOVE) ×2 IMPLANT
GOWN STRL REUS W/TWL 2XL LVL3 (GOWN DISPOSABLE) ×2 IMPLANT
NEEDLE HYPO 22GX1.5 SAFETY (NEEDLE) ×2 IMPLANT
NEEDLE HYPO 25X1 1.5 SAFETY (NEEDLE) IMPLANT
NS IRRIG 500ML POUR BTL (IV SOLUTION) ×2 IMPLANT
PACK BASIN DAY SURGERY FS (CUSTOM PROCEDURE TRAY) ×2 IMPLANT
PAD ABD 8X10 STRL (GAUZE/BANDAGES/DRESSINGS) ×2 IMPLANT
PENCIL BUTTON HOLSTER BLD 10FT (ELECTRODE) ×2 IMPLANT
SPONGE GAUZE 4X4 12PLY (GAUZE/BANDAGES/DRESSINGS) ×2 IMPLANT
SPONGE GAUZE 4X4 12PLY STER LF (GAUZE/BANDAGES/DRESSINGS) ×2 IMPLANT
SPONGE SURGIFOAM ABS GEL 100 (HEMOSTASIS) IMPLANT
SPONGE SURGIFOAM ABS GEL 12-7 (HEMOSTASIS) IMPLANT
STAPLER VISISTAT 35W (STAPLE) IMPLANT
SUT CHROMIC 2 0 SH (SUTURE) IMPLANT
SUT CHROMIC 3 0 SH 27 (SUTURE) IMPLANT
SUT PROLENE 2 0 BLUE (SUTURE) IMPLANT
SUT VIC AB 2-0 SH 27 (SUTURE) ×1
SUT VIC AB 2-0 SH 27XBRD (SUTURE) ×1 IMPLANT
SUT VIC AB 4-0 P-3 18XBRD (SUTURE) IMPLANT
SUT VIC AB 4-0 P3 18 (SUTURE)
SUT VIC AB 4-0 SH 18 (SUTURE) IMPLANT
SYR CONTROL 10ML LL (SYRINGE) ×2 IMPLANT
TRAY DSU PREP LF (CUSTOM PROCEDURE TRAY) ×2 IMPLANT
TUBE CONNECTING 12X1/4 (SUCTIONS) ×2 IMPLANT
WATER STERILE IRR 500ML POUR (IV SOLUTION) IMPLANT
YANKAUER SUCT BULB TIP NO VENT (SUCTIONS) ×2 IMPLANT

## 2014-03-02 NOTE — Anesthesia Preprocedure Evaluation (Addendum)
Anesthesia Evaluation  Patient identified by MRN, date of birth, ID band Patient awake    Reviewed: Allergy & Precautions, H&P , NPO status , Patient's Chart, lab work & pertinent test results  Airway Mallampati: II  TM Distance: >3 FB Neck ROM: Full    Dental no notable dental hx.    Pulmonary neg pulmonary ROS, former smoker,  breath sounds clear to auscultation  Pulmonary exam normal       Cardiovascular hypertension, Pt. on medications Rhythm:Regular Rate:Normal     Neuro/Psych negative neurological ROS  negative psych ROS   GI/Hepatic negative GI ROS, Neg liver ROS,   Endo/Other  diabetes, Type 2  Renal/GU negative Renal ROS  negative genitourinary   Musculoskeletal negative musculoskeletal ROS (+)   Abdominal   Peds negative pediatric ROS (+)  Hematology negative hematology ROS (+)   Anesthesia Other Findings   Reproductive/Obstetrics negative OB ROS                            Anesthesia Physical Anesthesia Plan  ASA: II  Anesthesia Plan: MAC   Post-op Pain Management:    Induction: Intravenous  Airway Management Planned: Simple Face Mask  Additional Equipment:   Intra-op Plan:   Post-operative Plan:   Informed Consent: I have reviewed the patients History and Physical, chart, labs and discussed the procedure including the risks, benefits and alternatives for the proposed anesthesia with the patient or authorized representative who has indicated his/her understanding and acceptance.   Dental advisory given  Plan Discussed with: CRNA and Surgeon  Anesthesia Plan Comments:         Anesthesia Quick Evaluation

## 2014-03-02 NOTE — ED Provider Notes (Signed)
CSN: [id number]     Arrival date & time 03/02/14  1821 History   First MD Initiated Contact with Patient 03/02/14 2026     Chief Complaint  Patient presents with  . Hypertension     (Consider location/radiation/quality/duration/timing/severity/associated sxs/prior Treatment) HPI Comments: Had episode of R arm and R leg numbness that lasted about 5 minutes this morning. Possible associated weakness at that time.   Patient is a 68 y.o. female presenting with hypertension and neurologic complaint. The history is provided by the patient.  Hypertension Pertinent negatives include no abdominal pain and no shortness of breath.  Neurologic Problem This is a new problem. The current episode started 6 to 12 hours ago. Episode frequency: one episode. The problem has not changed since onset.Pertinent negatives include no abdominal pain and no shortness of breath. Nothing aggravates the symptoms.    Past Medical History  Diagnosis Date  . Hypertension   . Hyperlipidemia   . Vitamin D deficiency   . Type 2 diabetes, diet controlled   . Bleeding internal hemorrhoids   . Wears glasses    Past Surgical History  Procedure Laterality Date  . Lumbar spine surgery  1970's  . Abdominal hysterectomy  1980's  . Laparoscopic cholecystectomy  1995  . Plantar fascia surgery Right 2003  . Anterior cervical decomp/discectomy fusion  05-19-2008    C6 -- C7  . Shoulder arthroscopy/  debridement labrum and rotator cuff/ bursectomy/ acromioplasty/  cal release/  excision distal clavical Bilateral right 09-15-2008/   left  10-17-2008  . Cataract extraction w/ intraocular lens  implant, bilateral  2011   Family History  Problem Relation Age of Onset  . Hypertension Father   . Stroke Father   . Hypertension Sister   . Heart disease Brother   . Hypertension Brother   . Heart disease Brother   . Heart disease Sister   . Heart disease Sister   . Cancer Sister     breast   History  Substance Use Topics   . Smoking status: Former Smoker -- 1.50 packs/day for 30 years    Types: Cigarettes    Quit date: 02/29/1992  . Smokeless tobacco: Never Used  . Alcohol Use: No   OB History    No data available     Review of Systems  Constitutional: Negative for fever.  Respiratory: Negative for cough and shortness of breath.   Gastrointestinal: Negative for vomiting and abdominal pain.  All other systems reviewed and are negative.     Allergies  Vicodin; Crestor; and Lipitor  Home Medications   Prior to Admission medications   Medication Sig Start Date End Date Taking? Authorizing Provider  aspirin EC 81 MG tablet Take 81 mg by mouth every morning.   Yes Historical Provider, MD  Cholecalciferol (VITAMIN D3) 50000 UNITS CAPS Take 1 capsule by mouth once a week.   Yes Historical Provider, MD  diltiazem (DILT-XR) 240 MG 24 hr capsule Take 240 mg by mouth every morning.   Yes Historical Provider, MD  ezetimibe (ZETIA) 10 MG tablet Take 10 mg by mouth every morning.   Yes Historical Provider, MD  lisinopril-hydrochlorothiazide (PRINZIDE,ZESTORETIC) 20-12.5 MG per tablet Take 1 tablet by mouth every morning.   Yes Historical Provider, MD  traMADol (ULTRAM) 50 MG tablet Take 1-2 tablets (50-100 mg total) by mouth every 6 (six) hours as needed. 25/8/52  Yes Leighton Ruff, MD   BP 172/76 mmHg  Pulse 83  Temp(Src) 98.2 F (36.8 C) (Oral)  Resp 19  SpO2 92% Physical Exam  Constitutional: She is oriented to person, place, and time. She appears well-developed and well-nourished. No distress.  HENT:  Head: Normocephalic and atraumatic.  Mouth/Throat: Oropharynx is clear and moist.  Eyes: EOM are normal. Pupils are equal, round, and reactive to light.  Neck: Normal range of motion. Neck supple.  Cardiovascular: Normal rate and regular rhythm.  Exam reveals no friction rub.   No murmur heard. Pulmonary/Chest: Effort normal and breath sounds normal. No respiratory distress. She has no wheezes. She  has no rales.  Abdominal: Soft. She exhibits no distension. There is no tenderness. There is no rebound.  Musculoskeletal: Normal range of motion. She exhibits no edema.  Neurological: She is alert and oriented to person, place, and time.  Skin: No rash noted. She is not diaphoretic.  Nursing note and vitals reviewed.   ED Course  Procedures (including critical care time) Labs Review Labs Reviewed  CBC  BASIC METABOLIC PANEL  URINE RAPID DRUG SCREEN (HOSP PERFORMED)  I-STAT TROPOININ, ED    Imaging Review Dg Chest 2 View  03/02/2014   CLINICAL DATA:  Right leg numbness, right arm numbness, and cough today. Previous smoker.  EXAM: CHEST  2 VIEW  COMPARISON:  05/16/2008  FINDINGS: Mild hyperinflation of the lungs. Normal heart size and pulmonary vascularity. No focal airspace disease or consolidation in the lungs. No blunting of costophrenic angles. No pneumothorax. Mediastinal contours appear intact. Postoperative changes in the cervical spine. Calcification of the aorta.  IMPRESSION: Pulmonary hyperinflation.  No active cardiopulmonary disease.   Electronically Signed   By: Lucienne Capers M.D.   On: 03/02/2014 21:38   Ct Head Wo Contrast  03/02/2014   CLINICAL DATA:  Right-sided numbness occurring earlier in the day. Hypertension  EXAM: CT HEAD WITHOUT CONTRAST  TECHNIQUE: Contiguous axial images were obtained from the base of the skull through the vertex without intravenous contrast.  COMPARISON:  None.  FINDINGS: Ventricles are normal in size and configuration. Enlargement of the cisterna magna is an anatomic variant. There is no mass, hemorrhage, extra-axial fluid collection, or midline shift. There is rather minimal periventricular small vessel disease in the centra semiovale bilaterally. There is no demonstrable acute infarct. Bony calvarium appears intact. Mastoids on the right are clear. There is opacification of multiple mastoid air cells on the left. No air-fluid levels seen.   IMPRESSION: Minimal periventricular small vessel disease. No intracranial mass, hemorrhage, or acute appearing infarct. Opacification of multiple mastoids on the left, age uncertain. Mastoids on the right are clear.   Electronically Signed   By: Lowella Grip M.D.   On: 03/02/2014 21:44     EKG Interpretation None      MDM   Final diagnoses:  Right leg numbness  Transient cerebral ischemia, unspecified transient cerebral ischemia type    68 year old female here with symptoms concerning for possible TIA. Had episode of right arm and leg numbness with possible weakness this morning lasting about 5 minutes. This is prior to a hemorrhoid surgery she had. Hemorrhoid surgery well without complication and she has had no other symptoms since then. Denies chest pain, shortness of breath, vomiting, fever. No history of stroke or TIA, but does have history of diabetes, hypertension, hyperlipidemia. We'll do TIA workup and plan for admission at [facility].    Evelina Bucy, MD 03/02/14 2322

## 2014-03-02 NOTE — ED Notes (Signed)
Report to [facility] given

## 2014-03-02 NOTE — Interval H&P Note (Signed)
History and Physical Interval Note:  03/02/2014 10:11 AM  [name]  has presented today for surgery, with the diagnosis of bleeding internal hemorrhoids  The various methods of treatment have been discussed with the patient and family. After consideration of risks, benefits and other options for treatment, the patient has consented to  Procedure(s): HEMORRHOIDOPEXY (N/A) as a surgical intervention .  The patient's history has been reviewed, patient examined, no change in status, stable for surgery.  I have reviewed the patient's chart and labs.  Questions were answered to the patient's satisfaction.     Rosario Adie, MD  [facility]

## 2014-03-02 NOTE — H&P (View-Only) (Signed)
[name] 02/21/2014 1:35 PM Location: [facility] Patient #: [id number] DOB: 09-22-1945 Married / Language: Cleophus Molt / Race: White Female History of Present Illness [name]; 66/59/9357 2:22 PM) Patient words: bleeding hems.  The patient is a 68 year old female who presents with a complaint of rectal bleeding. The onset of the rectal bleeding has been gradual and has been occurring in a persistent pattern. The course has been increasing. She reports several episodes a week. The rectal bleeding is characterized as blood mixed in stools. Her stools are mostly loose. She denies any constipation. Her fiber intake is dietary. There has been no associated abdominal pain, change in bowel habits or change in caliber of stools. She underwent proctoscopy and sclerosing by Dr Dalbert Batman 3 yrs ago with little results. Most recent colonoscopy several months ago was negative. Other Problems [name]; 02/21/2014 1:38 PM) Back Pain Diabetes Mellitus Hemorrhoids High blood pressure Hypercholesterolemia  Past Surgical History [name]; 02/21/2014 1:38 PM) Cataract Surgery Bilateral. Colon Polyp Removal - Colonoscopy Foot Surgery Right. Gallbladder Surgery - Laparoscopic Hysterectomy (not due to cancer) - Partial Shoulder Surgery Bilateral. Spinal Surgery - Lower Back Spinal Surgery - Neck  Diagnostic Studies History [name]; 02/21/2014 1:38 PM) Colonoscopy within last year Mammogram within last year Pap Smear >5 years ago  Allergies [name]; 02/21/2014 1:36 PM) Vicodin *ANALGESICS - OPIOID* Lipitor *ANTIHYPERLIPIDEMICS* Crestor *ANTIHYPERLIPIDEMICS*  Medication History [name]; 02/21/2014 1:38 PM) Vitamin D (Ergocalciferol) (50000UNIT Capsule, Oral weekly) Active. Neomycin-Polymyxin-HC (3.5-10000-1 Suspension, Otic as needed) Active. Lisinopril-Hydrochlorothiazide (20-12.5MG  Tablet, Oral daily)  Active. Diltiazem CD (240MG  Capsule ER 24HR, Oral) Active. Zetia (10MG  Tablet, Oral daily) Active. Baby Aspirin (81MG  Tablet Chewable, Oral) Active.  Social History [name]; 02/21/2014 1:38 PM) Alcohol use Occasional alcohol use. Caffeine use Coffee, Tea. No drug use Tobacco use Former smoker.  Family History [name]; 02/21/2014 1:38 PM) Alcohol Abuse Brother. Breast Cancer Sister. Cerebrovascular Accident Father. Heart Disease Brother, Father, Sister. Heart disease in female family member before age 38 Hypertension Father, Sister. Melanoma Sister.  Pregnancy / Birth History [name]; 02/21/2014 1:38 PM) Age at menarche 32 years. Age of menopause 51-50 Contraceptive History Intrauterine device. Gravida 1 Maternal age 63-25 Para 1 Regular periods     Review of Systems Apolonio Schneiders Sage; 02/21/2014 1:38 PM) General Not Present- Appetite Loss, Chills, Fatigue, Fever, Night Sweats, Weight Gain and Weight Loss. Skin Present- Dryness. Not Present- Change in Wart/Mole, Hives, Jaundice, New Lesions, Non-Healing Wounds, Rash and Ulcer. HEENT Present- Seasonal Allergies and Wears glasses/contact lenses. Not Present- Earache, Hearing Loss, Hoarseness, Nose Bleed, Oral Ulcers, Ringing in the Ears, Sinus Pain, Sore Throat, Visual Disturbances and Yellow Eyes. Respiratory Present- Snoring. Not Present- Bloody sputum, Chronic Cough, Difficulty Breathing and Wheezing. Breast Not Present- Breast Mass, Breast Pain, Nipple Discharge and Skin Changes. Cardiovascular Not Present- Chest Pain, Difficulty Breathing Lying Down, Leg Cramps, Palpitations, Rapid Heart Rate, Shortness of Breath and Swelling of Extremities. Gastrointestinal Present- Hemorrhoids and Rectal Pain. Not Present- Abdominal Pain, Bloating, Bloody Stool, Change in Bowel Habits, Chronic diarrhea, Constipation, Difficulty Swallowing, Excessive gas, Gets full quickly at meals, Indigestion,  Nausea and Vomiting. Female Genitourinary Present- Frequency. Not Present- Nocturia, Painful Urination, Pelvic Pain and Urgency. Musculoskeletal Present- Joint Stiffness and Muscle Pain. Not Present- Back Pain, Joint Pain, Muscle Weakness and Swelling of Extremities. Neurological Not Present- Decreased Memory, Fainting, Headaches, Numbness, Seizures, Tingling, Tremor, Trouble walking and Weakness. Psychiatric Not Present- Anxiety, Bipolar, Change in Sleep Pattern, Depression, Fearful and Frequent crying.  Endocrine Present- New Diabetes. Not Present- Cold Intolerance, Excessive Hunger, Hair Changes, Heat Intolerance and Hot flashes. Hematology Not Present- Easy Bruising, Excessive bleeding, Gland problems, HIV and Persistent Infections.  Vitals Apolonio Schneiders [place]; 02/21/2014 1:39 PM) 02/21/2014 1:39 PM Weight: 153 lb Height: 63in Body Surface Area: 1.76 m Body Mass Index: 27.1 kg/m Temp.: 87F(Tympanic)  Pulse: 66 (Regular)  BP: 120/72 (Sitting, Left Arm, Standard)     Physical Exam [name]; 56/21/3086 2:24 PM)  General Mental Status-Alert. General Appearance-Not in acute distress. Orientation-Oriented X3. Hydration-Well hydrated. Voice-Normal.  Integumentary Global Assessment Upon inspection and palpation of skin surfaces of the - Axillae: non-tender, no inflammation or ulceration, no drainage. and Distribution of scalp and body hair is normal. General Characteristics Temperature - normal warmth is noted.  Head and Neck Head-normocephalic, atraumatic with no lesions or palpable masses. Face Global Assessment - atraumatic, no absence of expression. Neck Global Assessment - no abnormal movements, no bruit auscultated on the right, no bruit auscultated on the left, no decreased range of motion, non-tender. Trachea-midline. Thyroid Gland Characteristics - non-tender.  ENMT Nose and Sinuses External Inspection of the Nose - no destructive  lesion observed. Inspection of the nares - Left - quiet respiration. Right - quiet respiration. Mouth and Throat Lips - Upper Lip - no fissures observed, no pallor noted. Lower Lip - no fissures observed, no pallor noted. Nasopharynx - no discharge present. Oral Cavity/Oropharynx - Tongue - no dryness observed. Oral Mucosa - no cyanosis observed. Hypopharynx - no evidence of airway distress observed.  Chest and Lung Exam Inspection Movements - Normal and Symmetrical. Accessory muscles - No use of accessory muscles in breathing. Palpation Palpation of the chest reveals - Non-tender. Auscultation Breath sounds - Normal and Clear.  Cardiovascular Auscultation Rhythm - Regular. Murmurs & Other Heart Sounds - Auscultation of the heart reveals - No Murmurs and No Systolic Clicks.  Abdomen Inspection Inspection of the abdomen reveals - No Visible peristalsis and No Abnormal pulsations. Palpation/Percussion Palpation and Percussion of the abdomen reveal - Soft, Non Tender, No Rebound tenderness, No Rigidity (guarding) and No Cutaneous hyperesthesia.  Rectal Anorectal Exam External - normal external exam. Internal - normal internal exam. Proctoscopic exam -Note:see procedure note.   Peripheral Vascular Upper Extremity Inspection - Left - No Cyanotic nailbeds, Not Ischemic. Right - No Cyanotic nailbeds, Not Ischemic.  Neurologic Neurologic evaluation reveals -normal attention span and ability to concentrate, able to name objects and repeat phrases. Appropriate fund of knowledge , normal sensation and normal coordination. Mental Status Affect - not angry, not paranoid. Cranial Nerves-Normal Bilaterally. Gait-Normal.  Neuropsychiatric Mental status exam performed with findings of-able to articulate well with normal speech/language, rate, volume and coherence, thought content normal with ability to perform basic computations and apply abstract reasoning and no evidence of  hallucinations, delusions, obsessions or homicidal/suicidal ideation.    Assessment & Plan [name]; 57/84/6962 2:22 PM)  BLEEDING INTERNAL HEMORRHOIDS (455.2  K64.8) Story: this is a 68 year old female with bleeding internal hemorrhoids. Recent colonoscopy has been negative for any other source of bleeding. She reports mainly loose stools. She denies any constipation or frank diarrhea. She's tried fiber supplements and rectal suppositories with no relief in symptoms. Impression: on exam she has a grade 2 inflamed internal hemorrhoid. We discussed office banding procedures versus operative procedures. After discussing the risks and benefits of both, she has decided to undergo an operative hemorrhoidectomy. Risks include bleeding, pain and recurrence.  Current Plans Pt Education - [facility] Hemorrhoids (  AT) ANOSCOPY, DIAGNOSTIC (20813)  Hemorrhoids Procedure  Other: Procedure: Anoscopy Surgeon: Marcello Moores Assistant: Juliane Lack After the risks and benefits were explained, verbal consent was obtained for above procedure Anesthesia: none Diagnosis: rectal bleeding Findings: Grade 2 R posterior internal hemorrhoids with signs of inflammation and bleeding

## 2014-03-02 NOTE — Discharge Instructions (Addendum)
ANORECTAL SURGERY: POST OP INSTRUCTIONS °1. Take your usually prescribed home medications unless otherwise directed. °2. DIET: During the first few hours after surgery sip on some liquids until you are able to urinate.  It is normal to not urinate for several hours after this surgery.  If you feel uncomfortable, please contact the office for instructions.  After you are able to urinate,you may eat, if you feel like it.  Follow a light bland diet the first 24 hours after arrival home, such as soup, liquids, crackers, etc.  Be sure to include lots of fluids daily (6-8 glasses).  Avoid fast food or heavy meals, as your are more likely to get nauseated.  Eat a low fat diet the next few days after surgery.  Limit caffeine intake to 1-2 servings a day. °3. PAIN CONTROL: °a. Pain is best controlled by a usual combination of several different methods TOGETHER: °i. Muscle relaxation: Soak in a warm bath (or Sitz bath) three times a day and after bowel movements.  Continue to do this until all pain is resolved. °ii. Over the counter pain medication °iii. Prescription pain medication °b. Most patients will experience some swelling and discomfort in the anus/rectal area and incisions.  Heat such as warm towels, sitz baths, warm baths, etc to help relax tight/sore spots and speed recovery.  Some people prefer to use ice, especially in the first couple days after surgery, as it may decrease the pain and swelling, or alternate between ice & heat.  Experiment to what works for you.  Swelling and bruising can take several weeks to resolve.  Pain can take even longer to completely resolve. °c. It is helpful to take an over-the-counter pain medication regularly for the first few weeks.  Choose one of the following that works best for you: °i. Naproxen (Aleve, etc)  Two 220mg tabs twice a day °ii. Ibuprofen (Advil, etc) Three 200mg tabs four times a day (every meal & bedtime) °d. A  prescription for pain medication (such as percocet,  oxycodone, hydrocodone, etc) should be given to you upon discharge.  Take your pain medication as prescribed.  °i. If you are having problems/concerns with the prescription medicine (does not control pain, nausea, vomiting, rash, itching, etc), please call us [phone] to see if we need to switch you to a different pain medicine that will work better for you and/or control your side effect better. °ii. If you need a refill on your pain medication, please contact your pharmacy.  They will contact our office to request authorization. Prescriptions will not be filled after 5 pm or on week-ends. °4. KEEP YOUR BOWELS REGULAR and AVOID CONSTIPATION °a. The goal is one to two soft bowel movements a day.  You should at least have a bowel movement every other day. °b. Avoid getting constipated.  Between the surgery and the pain medications, it is common to experience some constipation. This can be very painful after rectal surgery.  Increasing fluid intake and taking a fiber supplement (such as Metamucil, Citrucel, FiberCon, etc) 1-2 times a day regularly will usually help prevent this problem from occurring.  A stool softener like colace is also recommended.  This can be purchased over the counter at your pharmacy.  You can take it up to 3 times a day.  If you do not have a bowel movement after 24 hrs since your surgery, take one does of milk of magnesia.  If you still haven't had a bowel movement 8-12 hours after   that dose, take another dose.  If you don't have a bowel movement 48 hrs after surgery, purchase a Fleets enema from the drug store and administer gently per package instructions.  If you still are having trouble with your bowel movements after that, please call the office for further instructions. °c. If you develop diarrhea or have many loose bowel movements, simplify your diet to bland foods & liquids for a few days.  Stop any stool softeners and decrease your fiber supplement.  Switching to mild  anti-diarrheal medications (Kayopectate, Pepto Bismol) can help.  If this worsens or does not improve, please call us. ° °5. Wound Care °a. Remove your bandages before your first bowel movement or 8 hours after surgery.     °b. Remove any wound packing material at this tim,e as well.  You do not need to repack the wound unless instructed otherwise.  Wear an absorbent pad or soft cotton gauze in your underwear to catch any drainage and help keep the area clean. You should change this every 2-3 hours while awake. °c. Keep the area clean and dry.  Bathe / shower every day, especially after bowel movements.  Keep the area clean by showering / bathing over the incision / wound.   It is okay to soak an open wound to help wash it.  Wet wipes or showers / gentle washing after bowel movements is often less traumatic than regular toilet paper. °d. You may have some styrofoam-like soft packing in the rectum which will come out with the first bowel movement.  °e. You will often notice bleeding with bowel movements.  This should slow down by the end of the first week of surgery °f. Expect some drainage.  This should slow down, too, by the end of the first week of surgery.  Wear an absorbent pad or soft cotton gauze in your underwear until the drainage stops. °g. Do Not sit on a rubber or pillow ring.  This can make you symptoms worse.  You may sit on a soft pillow if needed.  °6. ACTIVITIES as tolerated:   °a. You may resume regular (light) daily activities beginning the next day--such as daily self-care, walking, climbing stairs--gradually increasing activities as tolerated.  If you can walk 30 minutes without difficulty, it is safe to try more intense activity such as jogging, treadmill, bicycling, low-impact aerobics, swimming, etc. °b. Save the most intensive and strenuous activity for last such as sit-ups, heavy lifting, contact sports, etc  Refrain from any heavy lifting or straining until you are off narcotics for pain  control.   °c. You may drive when you are no longer taking prescription pain medication, you can comfortably sit for long periods of time, and you can safely maneuver your car and apply brakes. °d. You may have sexual intercourse when it is comfortable.  °7. FOLLOW UP in our office °a. Please call [facility] at [phone] to set up an appointment to see your surgeon in the office for a follow-up appointment approximately 3-4 weeks after your surgery. °b. Make sure that you call for this appointment the day you arrive home to insure a convenient appointment time. °10. IF YOU HAVE DISABILITY OR FAMILY LEAVE FORMS, BRING THEM TO THE OFFICE FOR PROCESSING.  DO NOT GIVE THEM TO YOUR DOCTOR. ° ° ° ° °WHEN TO CALL US [phone]: °1. Poor pain control °2. Reactions / problems with new medications (rash/itching, nausea, etc)  °3. Fever over 101.5 F (38.5 C) °4.   Inability to urinate °5. Nausea and/or vomiting °6. Worsening swelling or bruising °7. Continued bleeding from incision. °8. Increased pain, redness, or drainage from the incision ° °The clinic staff is available to answer your questions during regular business hours (8:30am-5pm).  Please don’t hesitate to call and ask to speak to one of our nurses for clinical concerns.   A surgeon from [facility] is always on call at the hospitals °  °If you have a medical emergency, go to the nearest emergency room or call 911. °  ° °[facility] °[address], [address], [place], [place]  [address] ? °MAIN: [phone] ? TOLL FREE: [phone] ? °FAX [phone] °[email or web address] ° ° ° ° ° °Post Anesthesia Home Care Instructions ° °Activity: °Get plenty of rest for the remainder of the day. A responsible adult should stay with you for 24 hours following the procedure.  °For the next 24 hours, DO NOT: °-Drive a car °-Operate machinery °-Drink alcoholic beverages °-Take any medication unless instructed by your  physician °-Make any legal decisions or sign important papers. ° °Meals: °Start with liquid foods such as gelatin or soup. Progress to regular foods as tolerated. Avoid greasy, spicy, heavy foods. If nausea and/or vomiting occur, drink only clear liquids until the nausea and/or vomiting subsides. Call your physician if vomiting continues. ° °Special Instructions/Symptoms: °Your throat may feel dry or sore from the anesthesia or the breathing tube placed in your throat during surgery. If this causes discomfort, gargle with warm salt water. The discomfort should disappear within 24 hours. ° °

## 2014-03-02 NOTE — ED Notes (Signed)
Per pt, had hemorrhoid surgery this morning.  Pt felt right side numbness in right leg and foot.  This occurred at 8:30 am.  Pt stated arm became slightly numb in route to hospital.  BP was elevated at 174/82.  Numbness went away before procedure.  Pt cannot remember is she told her MD at procedure.  Pt states no symptoms since this morning but is concerned.  She did take her bp meds this morning.

## 2014-03-02 NOTE — Transfer of Care (Signed)
Immediate Anesthesia Transfer of Care Note  Patient: [name]  Procedure(s) Performed: Procedure(s): HEMORRHOIDOPEXY (N/A)  Patient Location: PACU  Anesthesia Type:MAC  Level of Consciousness: awake, alert  and oriented  Airway & Oxygen Therapy: Patient Spontanous Breathing  Post-op Assessment: Report given to PACU RN  Post vital signs: Reviewed  Complications: No apparent anesthesia complications

## 2014-03-02 NOTE — Op Note (Signed)
03/02/2014  10:56 AM  PATIENT:  [name]  68 y.o. female  Patient Care Team: Vidal Schwalbe, MD as PCP - General (Family Medicine)  PRE-OPERATIVE DIAGNOSIS:  bleeding internal hemorrhoids  POST-OPERATIVE DIAGNOSIS:  bleeding internal hemorrhoid and internal rectal mucosal prolapse  PROCEDURE:  R ANTERIOR HEMORRHOIDOPEXY   Surgeon(s): Leighton Ruff, MD  ASSISTANT: none   ANESTHESIA:   local and MAC  SPECIMEN:  No Specimen  DISPOSITION OF SPECIMEN:  N/A  COUNTS:  YES  PLAN OF CARE: Discharge to home after PACU  PATIENT DISPOSITION:  PACU - hemodynamically stable.  INDICATION: This is a 68 y.o. F who presents to my office with rectal bleeding.  An inflamed internal hemorrhoid was seen in the office and she elected to proceed with surgical repair.   OR FINDINGS: small, prolapsing right anterior hemorrhoid, internal rectal mucosal prolapse.    DESCRIPTION: the patient was identified in the preoperative holding area and taken to the OR where they were laid on the operating room table.  MAC anesthesia was induced without difficulty. The patient was then positioned in prone jackknife position with buttocks gently taped apart.  The patient was then prepped and draped in usual sterile fashion.  SCDs were noted to be in place prior to the initiation of anesthesia. A surgical timeout was performed indicating the correct patient, procedure, positioning and need for preoperative antibiotics.  A rectal block was placed using Marcaine with epinephrine  I began with a digital rectal exam.  There were no abnormal masses palpated.  I then placed a Hill-Ferguson anoscope into the anal canal and evaluated this completely.  The rectal mucosa was relatively normal. There was no hemorrhoidal tissue noted in the left lateral oral right posterior positions. There was a small amount of hemorrhoidal tissue that prolapsed out on the right anterior position. This prolapsing tissue appeared to be  inflamed and the cause of her rectal bleeding. A hemorrhoidal pexy was performed using 2 interrupted 2-0 Vicryl sutures. On exam I did notice significant rectal mucosal prolapse internally. Once this was completed and hemostasis was confirmed, a dressing was applied and the patient was awakened from anesthesia. She was sent to the post anesthesia care unit in stable condition. All counts were correct per operating room staff.

## 2014-03-02 NOTE — Anesthesia Postprocedure Evaluation (Signed)
  Anesthesia Post-op Note  Patient: [name]  Procedure(s) Performed: Procedure(s) (LRB): HEMORRHOIDOPEXY (N/A)  Patient Location: PACU  Anesthesia Type: General  Level of Consciousness: awake and alert   Airway and Oxygen Therapy: Patient Spontanous Breathing  Post-op Pain: mild  Post-op Assessment: Post-op Vital signs reviewed, Patient's Cardiovascular Status Stable, Respiratory Function Stable, Patent Airway and No signs of Nausea or vomiting  Last Vitals:  Filed Vitals:   03/02/14 1115  BP: 128/56  Pulse: 73  Temp:   Resp: 20    Post-op Vital Signs: stable   Complications: No apparent anesthesia complications

## 2014-03-02 NOTE — H&P (Addendum)
[facility] History and Physical  Icela Glymph STM:[id number] DOB: Sep 28, 1945 DOA: 03/02/2014  Referring physician: ED physician PCP: Vidal Schwalbe, MD  Specialists:   Chief Complaint: right arm and leg numbness  HPI: Smt Lokey is a 68 y.o. female with past medical history for diabetes, hypertension, hyperlipidemia, who presented with right arm and leg numbness.  Patent reports that at about 8:30 AM, she suddenly felt numb over her right leg and arm when she got up from chair. She did not have weakness in her extremities. No vision or hearing change. No slurry speech. The symptoms resolved spontaneously at about 9:15 AM. After the event, she went to have had her scheduled hemorrhoid surgery. Her surgery went uneventfully.  After she went home, she thought over her symptoms and realized that it could be serious issue, such as stroke, therefore she comes to Ed for further evaluation and treatment. Patient does not have fever, chills, cough, chest pain, shortness breath, nausea, vomiting, abdominal pain, diarrhea.  CT head is negative for acute abnormalities in the ED. She is admitted to inpatient for further evaluation treatment.  Review of Systems: As presented in the history of presenting illness, rest negative.  Where does patient live? Lives at home  Can patient participate in ADLs? Yes  Allergy:  Allergies  Allergen Reactions  . Vicodin [Hydrocodone-Acetaminophen] Swelling  . Crestor [Rosuvastatin Calcium] Other (See Comments)    MYALGIA  . Lipitor [Atorvastatin Calcium] Other (See Comments)    MYALGIA    Past Medical History  Diagnosis Date  . Hypertension   . Hyperlipidemia   . Vitamin D deficiency   . Type 2 diabetes, diet controlled   . Bleeding internal hemorrhoids   . Wears glasses     Past Surgical History  Procedure Laterality Date  . Lumbar spine surgery  1970's  . Abdominal hysterectomy  1980's  . Laparoscopic cholecystectomy  1995  . Plantar  fascia surgery Right 2003  . Anterior cervical decomp/discectomy fusion  05-19-2008    C6 -- C7  . Shoulder arthroscopy/  debridement labrum and rotator cuff/ bursectomy/ acromioplasty/  cal release/  excision distal clavical Bilateral right 09-15-2008/   left  10-17-2008  . Cataract extraction w/ intraocular lens  implant, bilateral  2011    Social History:  reports that she quit smoking about 22 years ago. Her smoking use included Cigarettes. She has a 45 pack-year smoking history. She has never used smokeless tobacco. She reports that she does not drink alcohol or use illicit drugs.  Family History:  Family History  Problem Relation Age of Onset  . Hypertension Father   . Stroke Father   . Hypertension Sister   . Heart disease Brother   . Hypertension Brother   . Heart disease Brother   . Heart disease Sister   . Heart disease Sister   . Cancer Sister     breast     Prior to Admission medications   Medication Sig Start Date End Date Taking? Authorizing Provider  aspirin EC 81 MG tablet Take 81 mg by mouth every morning.   Yes Historical Provider, MD  Cholecalciferol (VITAMIN D3) 50000 UNITS CAPS Take 1 capsule by mouth once a week.   Yes Historical Provider, MD  diltiazem (DILT-XR) 240 MG 24 hr capsule Take 240 mg by mouth every morning.   Yes Historical Provider, MD  ezetimibe (ZETIA) 10 MG tablet Take 10 mg by mouth every morning.   Yes Historical Provider, MD  lisinopril-hydrochlorothiazide (PRINZIDE,ZESTORETIC) 20-12.5 MG  per tablet Take 1 tablet by mouth every morning.   Yes Historical Provider, MD  traMADol (ULTRAM) 50 MG tablet Take 1-2 tablets (50-100 mg total) by mouth every 6 (six) hours as needed. 62/3/76  Yes Leighton Ruff, MD    Physical Exam: Filed Vitals:   03/02/14 2023 03/02/14 2232 03/02/14 2312 03/03/14 0000  BP: 172/76 143/68 149/71 160/76  Pulse: 83 80 76 78  Temp: 98.2 F (36.8 C) 98.2 F (36.8 C) 98 F (36.7 C) 98.2 F (36.8 C)  TempSrc: Oral Oral  Oral Oral  Resp: 19 22 18 18   Height:    5\' 3"  (1.6 m)  Weight:    70.897 kg (156 lb 4.8 oz)  SpO2: 92% 91% 94% 93%   General: Not in acute distress HEENT:       Eyes: PERRL, EOMI, no scleral icterus       ENT: No discharge from the ears and nose, no pharynx injection, no tonsillar enlargement.        Neck: No JVD, no bruit, no mass felt. Cardiac: S1/S2, RRR, No murmurs, gallops or rubs Pulm: Good air movement bilaterally. Clear to auscultation bilaterally. No rales, wheezing, rhonchi or rubs. Abd: Soft, nondistended, nontender, no rebound pain, no organomegaly, BS present Ext: No edema. 2+DP/PT pulse bilaterally Musculoskeletal: No joint deformities, erythema, or stiffness, ROM full Skin: No rashes.  Neuro: Alert and oriented X3, cranial nerves II-XII grossly intact, muscle strength 5/5 in all extremeties, sensation to light touch intact. Brachial reflex 2+ bilaterally. Knee reflex 1+ bilaterally. Negative Babinski's sign. Normal finger to nose test. Psych: Patient is not psychotic, no suicidal or hemocidal ideation.  Labs on Admission:  Basic Metabolic Panel:  Recent Labs Lab 03/02/14 0950 03/02/14 2110  NA 144 139  K 3.6* 4.0  CL 104 100  CO2  --  25  GLUCOSE 124* 316*  BUN 21 24*  CREATININE 0.80 1.05  CALCIUM  --  9.9   Liver Function Tests: No results for input(s): AST, ALT, ALKPHOS, BILITOT, PROT, ALBUMIN in the last 168 hours. No results for input(s): LIPASE, AMYLASE in the last 168 hours. No results for input(s): AMMONIA in the last 168 hours. CBC:  Recent Labs Lab 03/02/14 0950 03/02/14 2110  WBC  --  8.6  HGB 17.3* 15.3*  HCT 51.0* 45.0  MCV  --  84.4  PLT  --  213   Cardiac Enzymes: No results for input(s): CKTOTAL, CKMB, CKMBINDEX, TROPONINI in the last 168 hours.  BNP (last 3 results) No results for input(s): PROBNP in the last 8760 hours. CBG:  Recent Labs Lab 03/02/14 1114  GLUCAP 98    Radiological Exams on Admission: Dg Chest 2  View  03/02/2014   CLINICAL DATA:  Right leg numbness, right arm numbness, and cough today. Previous smoker.  EXAM: CHEST  2 VIEW  COMPARISON:  05/16/2008  FINDINGS: Mild hyperinflation of the lungs. Normal heart size and pulmonary vascularity. No focal airspace disease or consolidation in the lungs. No blunting of costophrenic angles. No pneumothorax. Mediastinal contours appear intact. Postoperative changes in the cervical spine. Calcification of the aorta.  IMPRESSION: Pulmonary hyperinflation.  No active cardiopulmonary disease.   Electronically Signed   By: Lucienne Capers M.D.   On: 03/02/2014 21:38   Ct Head Wo Contrast  03/02/2014   CLINICAL DATA:  Right-sided numbness occurring earlier in the day. Hypertension  EXAM: CT HEAD WITHOUT CONTRAST  TECHNIQUE: Contiguous axial images were obtained from the base of the skull  through the vertex without intravenous contrast.  COMPARISON:  None.  FINDINGS: Ventricles are normal in size and configuration. Enlargement of the cisterna magna is an anatomic variant. There is no mass, hemorrhage, extra-axial fluid collection, or midline shift. There is rather minimal periventricular small vessel disease in the centra semiovale bilaterally. There is no demonstrable acute infarct. Bony calvarium appears intact. Mastoids on the right are clear. There is opacification of multiple mastoid air cells on the left. No air-fluid levels seen.  IMPRESSION: Minimal periventricular small vessel disease. No intracranial mass, hemorrhage, or acute appearing infarct. Opacification of multiple mastoids on the left, age uncertain. Mastoids on the right are clear.   Electronically Signed   By: Lowella Grip M.D.   On: 03/02/2014 21:44    EKG: Independently reviewed.   Assessment/Plan Principal Problem:   TIA (transient ischemic attack) Active Problems:   Hypertension   Hyperlipidemia   Type 2 diabetes, diet controlled  TIA: patient symptoms are consistent with TIA.  CT-head is negative for acute abnormalities. Neurology was consulted by ED ans suggested to transfer pt to [facility].  - will admit to tele bed  - will follow up Neurology's Recs.  - Obtain MRI/MRA  - will hold Prinzide and diltiazem to allow permissive HTN in the setting of acute stroke  - Check carotid dopplers  - Continue ASA  - patient is allergic to statin, will continue zetia - fasting lipid panel and HbA1c  - 2D transthoracic echocardiography  - Check UDS  - PT/OT consult  HTN: on Prinzide and diltiazem at home - will hold bp med to allow permissive HTN in the setting of acute stroke   DM-II: diet controled at home. CBG=316 on admission -will check A1c - SSI  DVT ppx: SQ Heparin    Code Status: Full code Family Communication:   Yes, patient's  husband     at bed side Disposition Plan: Admit to inpatient   Date of Service 03/02/2014    Ivor Costa [facility] Pager [phone]  If 7PM-7AM, please contact night-coverage [email or web address] Password [email or web address] 03/03/2014, 1:50 AM

## 2014-03-02 NOTE — ED Notes (Signed)
[facility] called  

## 2014-03-03 ENCOUNTER — Encounter (HOSPITAL_BASED_OUTPATIENT_CLINIC_OR_DEPARTMENT_OTHER): Payer: Self-pay | Admitting: General Surgery

## 2014-03-03 ENCOUNTER — Inpatient Hospital Stay (HOSPITAL_COMMUNITY): Payer: Medicare Other

## 2014-03-03 DIAGNOSIS — G459 Transient cerebral ischemic attack, unspecified: Secondary | ICD-10-CM

## 2014-03-03 DIAGNOSIS — I639 Cerebral infarction, unspecified: Principal | ICD-10-CM

## 2014-03-03 DIAGNOSIS — I10 Essential (primary) hypertension: Secondary | ICD-10-CM

## 2014-03-03 DIAGNOSIS — E119 Type 2 diabetes mellitus without complications: Secondary | ICD-10-CM

## 2014-03-03 DIAGNOSIS — E785 Hyperlipidemia, unspecified: Secondary | ICD-10-CM

## 2014-03-03 LAB — GLUCOSE, CAPILLARY
Glucose-Capillary: 208 mg/dL — ABNORMAL HIGH (ref 70–99)
Glucose-Capillary: 136 mg/dL — ABNORMAL HIGH (ref 70–99)
Glucose-Capillary: 173 mg/dL — ABNORMAL HIGH (ref 70–99)

## 2014-03-03 LAB — HEMOGLOBIN A1C
Hgb A1c MFr Bld: 6.9 % — ABNORMAL HIGH (ref <5.7)
Mean Plasma Glucose: 151 mg/dL — ABNORMAL HIGH (ref <117)

## 2014-03-03 LAB — LIPID PANEL
CHOLESTEROL: 213 mg/dL — AB (ref 0–200)
HDL: 72 mg/dL (ref >39)
LDL CALC: 129 mg/dL — AB (ref 0–99)
TRIGLYCERIDES: 61 mg/dL (ref <150)
Total CHOL/HDL Ratio: 3 RATIO
VLDL: 12 mg/dL (ref 0–40)

## 2014-03-03 MED ORDER — SIMVASTATIN 20 MG PO TABS: 20.0000 mg | ORAL_TABLET | Freq: Every day | ORAL | Status: DC

## 2014-03-03 MED ORDER — INSULIN ASPART 100 UNIT/ML ~~LOC~~ SOLN
0.0000 [IU] | SUBCUTANEOUS | Status: DC
  Administered 2014-03-03: 2 [IU] via SUBCUTANEOUS
  Administered 2014-03-03: 3 [IU] via SUBCUTANEOUS
  Administered 2014-03-03: 1 [IU] via SUBCUTANEOUS

## 2014-03-03 MED ORDER — ASPIRIN 325 MG PO TBEC: 325.0000 mg | DELAYED_RELEASE_TABLET | Freq: Every morning | ORAL | Status: DC

## 2014-03-03 MED ORDER — INFLUENZA VAC SPLIT QUAD 0.5 ML IM SUSY: 0.5000 mL | PREFILLED_SYRINGE | INTRAMUSCULAR | Status: DC

## 2014-03-03 MED ORDER — ASPIRIN EC 325 MG PO TBEC
325.0000 mg | DELAYED_RELEASE_TABLET | Freq: Every morning | ORAL | Status: DC
Start: 2014-03-04 — End: 2014-03-03

## 2014-03-03 NOTE — Progress Notes (Signed)
  Echocardiogram 2D Echocardiogram has been performed.  Diamond Nickel 03/03/2014, 10:37 AM

## 2014-03-03 NOTE — Progress Notes (Signed)
Pt is being discharged home. Discharge information was given to patient and family

## 2014-03-03 NOTE — Progress Notes (Signed)
OT Cancellation Note  Patient Details Name: [name] MRN: [id number] DOB: June 12, 1945   Cancelled Treatment:    Reason Eval/Treat Not Completed: Patient not medically ready (Remains on bedrest.)  Malka So 03/03/2014, 2:13 PM

## 2014-03-03 NOTE — Progress Notes (Signed)
Pt continued to have right side numbness. MD made aware

## 2014-03-03 NOTE — Progress Notes (Signed)
PATIENT DETAILS Name: [name] Age: 68 y.o. Sex: female Date of Birth: January 09, 1946 Admit Date: 03/02/2014 Admitting Physician Ivor Costa, MD CBU:LAGTX,MIWOEHO S, MD  Subjective: No major issues overnight.  Assessment/Plan: Principal Problem:   TIA (transient ischemic attack):no further tingling/numbness of RUE/RLE. Non focal exam. Await work up, continue with ASA. Neuro following.  Active Problems:   Hypertension: BP moderately controlled, will resume Lisinopril.    Hyperlipidemia: LDL 129-not able to tolerate statins due to myalgia, continue Zetia.     Type 2 diabetes, diet controlled:A1C pending. CBG's stable.  Disposition: Remain inpatient  Antibiotics:  None  DVT Prophylaxis: Prophylactic Heparin   Code Status: Full code   Family Communication None at bedside, patient understanding of above plan  Procedures:  None  CONSULTS:  neurology   MEDICATIONS: Scheduled Meds: .  stroke: mapping our early stages of recovery book   Does not apply Once  . aspirin EC  81 mg Oral q morning - 10a  . [START ON 03/04/2014] cholecalciferol  5,000 Units Oral Q Fri  . ezetimibe  10 mg Oral q morning - 10a  . heparin  5,000 Units Subcutaneous 3 times per day  . [START ON 03/04/2014] Influenza vac split quadrivalent PF  0.5 mL Intramuscular Tomorrow-1000  . insulin aspart  0-9 Units Subcutaneous 6 times per day   Continuous Infusions:  PRN Meds:.senna-docusate, traMADol  Antibiotics: Anti-infectives    None       PHYSICAL EXAM: Vital signs in last 24 hours: Filed Vitals:   03/03/14 0400 03/03/14 0600 03/03/14 0800 03/03/14 0939  BP: 138/61 157/69 142/79 146/64  Pulse: 75 68 77 66  Temp: 98.1 F (36.7 C) 98.8 F (37.1 C) 98.6 F (37 C) 97.7 F (36.5 C)  TempSrc: Oral Oral Oral Oral  Resp: 18 18  20   Height:      Weight:      SpO2: 95% 97% 98% 97%    Weight change:  Filed Weights   03/03/14 0000  Weight: 70.897 kg (156 lb 4.8 oz)   Body  mass index is 27.69 kg/(m^2).   Gen Exam: Awake and alert with clear speech.   Neck: Supple, No JVD.   Chest: B/L Clear.   CVS: S1 S2 Regular, no murmurs.  Abdomen: soft, BS +, non tender, non distended.  Extremities: no edema, lower extremities warm to touch. Neurologic: Non Focal.   Skin: No Rash.   Wounds: N/A.    Intake/Output from previous day: No intake or output data in the 24 hours ending 03/03/14 1052   LAB RESULTS: CBC  Recent Labs Lab 03/02/14 0950 03/02/14 2110  WBC  --  8.6  HGB 17.3* 15.3*  HCT 51.0* 45.0  PLT  --  213  MCV  --  84.4  MCH  --  28.7  MCHC  --  34.0  RDW  --  12.8    Chemistries   Recent Labs Lab 03/02/14 0950 03/02/14 2110  NA 144 139  K 3.6* 4.0  CL 104 100  CO2  --  25  GLUCOSE 124* 316*  BUN 21 24*  CREATININE 0.80 1.05  CALCIUM  --  9.9    CBG:  Recent Labs Lab 03/02/14 1114 03/03/14 0428 03/03/14 0758  GLUCAP 98 208* 136*    GFR Estimated Creatinine Clearance: 47.1 mL/min (by C-G formula based on Cr of 1.05).  Coagulation profile No results for input(s): INR, PROTIME in the last 168 hours.  Cardiac Enzymes No results for input(s): CKMB, TROPONINI, MYOGLOBIN in the last 168 hours.  Invalid input(s): CK  Invalid input(s): POCBNP No results for input(s): DDIMER in the last 72 hours. No results for input(s): HGBA1C in the last 72 hours.  Recent Labs  03/03/14 0536  CHOL 213*  HDL 72  LDLCALC 129*  TRIG 61  CHOLHDL 3.0   No results for input(s): TSH, T4TOTAL, T3FREE, THYROIDAB in the last 72 hours.  Invalid input(s): FREET3 No results for input(s): VITAMINB12, FOLATE, FERRITIN, TIBC, IRON, RETICCTPCT in the last 72 hours. No results for input(s): LIPASE, AMYLASE in the last 72 hours.  Urine Studies No results for input(s): UHGB, CRYS in the last 72 hours.  Invalid input(s): UACOL, UAPR, USPG, UPH, UTP, UGL, UKET, UBIL, UNIT, UROB, ULEU, UEPI, UWBC, URBC, UBAC, CAST, UCOM,  BILUA  MICROBIOLOGY: No results found for this or any previous visit (from the past 240 hour(s)).  RADIOLOGY STUDIES/RESULTS: Dg Chest 2 View  03/02/2014   CLINICAL DATA:  Right leg numbness, right arm numbness, and cough today. Previous smoker.  EXAM: CHEST  2 VIEW  COMPARISON:  05/16/2008  FINDINGS: Mild hyperinflation of the lungs. Normal heart size and pulmonary vascularity. No focal airspace disease or consolidation in the lungs. No blunting of costophrenic angles. No pneumothorax. Mediastinal contours appear intact. Postoperative changes in the cervical spine. Calcification of the aorta.  IMPRESSION: Pulmonary hyperinflation.  No active cardiopulmonary disease.   Electronically Signed   By: Lucienne Capers M.D.   On: 03/02/2014 21:38   Ct Head Wo Contrast  03/02/2014   CLINICAL DATA:  Right-sided numbness occurring earlier in the day. Hypertension  EXAM: CT HEAD WITHOUT CONTRAST  TECHNIQUE: Contiguous axial images were obtained from the base of the skull through the vertex without intravenous contrast.  COMPARISON:  None.  FINDINGS: Ventricles are normal in size and configuration. Enlargement of the cisterna magna is an anatomic variant. There is no mass, hemorrhage, extra-axial fluid collection, or midline shift. There is rather minimal periventricular small vessel disease in the centra semiovale bilaterally. There is no demonstrable acute infarct. Bony calvarium appears intact. Mastoids on the right are clear. There is opacification of multiple mastoid air cells on the left. No air-fluid levels seen.  IMPRESSION: Minimal periventricular small vessel disease. No intracranial mass, hemorrhage, or acute appearing infarct. Opacification of multiple mastoids on the left, age uncertain. Mastoids on the right are clear.   Electronically Signed   By: Lowella Grip M.D.   On: 03/02/2014 21:44    Oren Binet, MD  [facility] Pager:[phone]  If 7PM-7AM, please contact  night-coverage [email or web address] Password [email or web address] 03/03/2014, 10:52 AM   LOS: 1 day

## 2014-03-03 NOTE — Consult Note (Signed)
Neurology Consultation Reason for Consult: right leg and arm weakness Referring Physician: Mora Bellman  CC: right leg and arm  numbness  History is obtained from:patient  HPI: [name] is a 68 y.o. female who was in her normal state of health earlier today, but has been off of aspirin due to a rectal surgery today. We'll getting up to go to the surgery, she noticed that her right leg became numb and then that she continue to, she noticed that her arm was numb as well. She is having mild difficulty walking but was able to continue, and the symptoms lasted less than 5 minutes so she continued to go through with the surgery. Her husband got her to call her PCP this afternoon to discuss this event, who referred her to the ED.   LKW: 8:30 am tpa given?: no, resolved symptoms    ROS: A 14 point ROS was performed and is negative except as noted in the HPI.   Past Medical History  Diagnosis Date  . Hypertension   . Hyperlipidemia   . Vitamin D deficiency   . Type 2 diabetes, diet controlled   . Bleeding internal hemorrhoids   . Wears glasses     Family History: father-stroke  Social History: Tob: denies  Exam: Current vital signs: BP 138/61 mmHg  Pulse 75  Temp(Src) 98.1 F (36.7 C) (Oral)  Resp 18  Ht 5\' 3"  (1.6 m)  Wt 70.897 kg (156 lb 4.8 oz)  BMI 27.69 kg/m2  SpO2 95% Vital signs in last 24 hours: Temp:  [97.7 F (36.5 C)-99.1 F (37.3 C)] 98.1 F (36.7 C) (11/05 0400) Pulse Rate:  [68-95] 75 (11/05 0400) Resp:  [16-22] 18 (11/05 0400) BP: (99-175)/(49-83) 138/61 mmHg (11/05 0400) SpO2:  [91 %-97 %] 95 % (11/05 0400) Weight:  [67.813 kg (149 lb 8 oz)-70.897 kg (156 lb 4.8 oz)] 70.897 kg (156 lb 4.8 oz) (11/05 0000)  General: in bed, NAD CV: regular rate and rhythm Mental Status: Patient is awake, alert, oriented to person, place, month, year, and situation. Immediate and remote memory are intact. Patient is able to give a clear and coherent history. No signs  of aphasia or neglect Cranial Nerves: II: Visual Fields are full. Pupils are equal, round, and reactive to light.  Discs are difficult to visualize. III,IV, VI: EOMI without ptosis or diploplia.  V: Facial sensation is symmetric to temperature VII: Facial movement is symmetric.  VIII: hearing is intact to voice X: Uvula elevates symmetrically XI: Shoulder shrug is symmetric. XII: tongue is midline without atrophy or fasciculations.  Motor: Tone is normal. Bulk is normal. 5/5 strength was present in all four extremities.  Sensory: Sensation is symmetric to light touch and temperature in the arms and legs. Deep Tendon Reflexes: 2+ and symmetric in the biceps and patellae.  Plantars: Toes are downgoing bilaterally.  Cerebellar: FNF and HKS are intact bilaterally Gait: Not tested due to monitors         I have reviewed labs in epic and the results pertinent to this consultation are: BMP-elevated glucose  I have reviewed the images obtained:CT head-no acute findings  Impression: 68 year old female with transient right arm and leg numbness most concerning for transient ischemic attack.  Recommendations: 1. HgbA1c, fasting lipid panel 2. MRI, MRA  of the brain without contrast 3. Frequent neuro checks 4. Echocardiogram 5. Carotid dopplers 6. Prophylactic therapy-Antiplatelet med: Aspirin - dose 325mg  PO or 300mg  PR 7. Risk factor modification 8. Telemetry monitoring 9.  PT consult, OT consult, Speech consult    Roland Rack, MD [facility] [phone]  If 7pm- 7am, please page neurology on call as listed in [place].

## 2014-03-03 NOTE — Progress Notes (Signed)
VASCULAR LAB PRELIMINARY  PRELIMINARY  PRELIMINARY  PRELIMINARY  Carotid duplex completed.    Preliminary report:  Bilateral:  1-39% ICA stenosis.  Vertebral artery flow is antegrade.     Peretz Thieme, RVS 03/03/2014, 12:08 PM

## 2014-03-03 NOTE — Progress Notes (Signed)
VASCULAR LAB PRELIMINARY  PRELIMINARY  PRELIMINARY  PRELIMINARY  Carotid duplex completed.    Preliminary report:  Bilateral:  1-39% ICA stenosis.  Vertebral artery flow is antegrade.     [name], RVS 03/03/2014, 3:57 PM

## 2014-03-03 NOTE — Discharge Summary (Signed)
PATIENT DETAILS Name: [name] Age: 69 y.o. Sex: female Date of Birth: [birth year] MRN: [id number]. Admitting Physician: Ivor Costa, MD PXT:GGYIR,SWNIOEV S, MD  Admit Date: 03/02/2014 Discharge date: 03/03/2014  Recommendations for Outpatient Follow-up:  1. Continue risk factor modification 2. Unable to tolerate Crestor/Lipitor in the past due to myalgia-trying Zocor dosed pm. Please re-assess at next visit.  PRIMARY DISCHARGE DIAGNOSIS:  Principal Problem:   TIA (transient ischemic attack) Active Problems:   Hypertension   Hyperlipidemia   Type 2 diabetes, diet controlled   Essential hypertension      PAST MEDICAL HISTORY: Past Medical History  Diagnosis Date  . Hypertension   . Hyperlipidemia   . Vitamin D deficiency   . Type 2 diabetes, diet controlled   . Bleeding internal hemorrhoids   . Wears glasses     DISCHARGE MEDICATIONS: Current Discharge Medication List    START taking these medications   Details  simvastatin (ZOCOR) 20 MG tablet Take 1 tablet (20 mg total) by mouth daily at 6 PM. Qty: 30 tablet, Refills: 0      CONTINUE these medications which have CHANGED   Details  aspirin EC 325 MG EC tablet Take 1 tablet (325 mg total) by mouth every morning. Qty: 30 tablet, Refills: 0      CONTINUE these medications which have NOT CHANGED   Details  Cholecalciferol (VITAMIN D3) 50000 UNITS CAPS Take 1 capsule by mouth once a week.    diltiazem (DILT-XR) 240 MG 24 hr capsule Take 240 mg by mouth every morning.    ezetimibe (ZETIA) 10 MG tablet Take 10 mg by mouth every morning.    lisinopril-hydrochlorothiazide (PRINZIDE,ZESTORETIC) 20-12.5 MG per tablet Take 1 tablet by mouth every morning.    traMADol (ULTRAM) 50 MG tablet Take 1-2 tablets (50-100 mg total) by mouth every 6 (six) hours as needed. Qty: 30 tablet, Refills: 1        ALLERGIES:   Allergies  Allergen Reactions  . Vicodin [Hydrocodone-Acetaminophen] Swelling  . Crestor  [Rosuvastatin Calcium] Other (See Comments)    MYALGIA  . Lipitor [Atorvastatin Calcium] Other (See Comments)    MYALGIA    BRIEF HPI:  See H&P, Labs, Consult and Test reports for all details in brief, patient is a 68 y.o. female with past medical history for diabetes, hypertension, hyperlipidemia, who presented with right arm and leg numbness.  CONSULTATIONS:   neurology  PERTINENT RADIOLOGIC STUDIES: Dg Chest 2 View  03/02/2014   CLINICAL DATA:  Right leg numbness, right arm numbness, and cough today. Previous smoker.  EXAM: CHEST  2 VIEW  COMPARISON:  05/16/2008  FINDINGS: Mild hyperinflation of the lungs. Normal heart size and pulmonary vascularity. No focal airspace disease or consolidation in the lungs. No blunting of costophrenic angles. No pneumothorax. Mediastinal contours appear intact. Postoperative changes in the cervical spine. Calcification of the aorta.  IMPRESSION: Pulmonary hyperinflation.  No active cardiopulmonary disease.   Electronically Signed   By: Lucienne Capers M.D.   On: 03/02/2014 21:38   Ct Head Wo Contrast  03/02/2014   CLINICAL DATA:  Right-sided numbness occurring earlier in the day. Hypertension  EXAM: CT HEAD WITHOUT CONTRAST  TECHNIQUE: Contiguous axial images were obtained from the base of the skull through the vertex without intravenous contrast.  COMPARISON:  None.  FINDINGS: Ventricles are normal in size and configuration. Enlargement of the cisterna magna is an anatomic variant. There is no mass, hemorrhage, extra-axial fluid collection, or midline shift. There is rather minimal  periventricular small vessel disease in the centra semiovale bilaterally. There is no demonstrable acute infarct. Bony calvarium appears intact. Mastoids on the right are clear. There is opacification of multiple mastoid air cells on the left. No air-fluid levels seen.  IMPRESSION: Minimal periventricular small vessel disease. No intracranial mass, hemorrhage, or acute appearing  infarct. Opacification of multiple mastoids on the left, age uncertain. Mastoids on the right are clear.   Electronically Signed   By: Lowella Grip M.D.   On: 03/02/2014 21:44   Mr Brain Wo Contrast  03/03/2014   CLINICAL DATA:  Right-sided weakness and numbness. Tingling sensation.  EXAM: MRI HEAD WITHOUT CONTRAST  MRA HEAD WITHOUT CONTRAST  TECHNIQUE: Multiplanar, multiecho pulse sequences of the brain and surrounding structures were obtained without intravenous contrast. Angiographic images of the head were obtained using MRA technique without contrast.  COMPARISON:  CT head without contrast 03/02/2014  FINDINGS: MRI HEAD FINDINGS  2 separate 5 mm foci of acute nonhemorrhagic infarct are present within the posterior limb of the left internal capsule. There is a punctate area of restricted diffusion in the posterior left parietal lobe as well. T2 changes are associated with both areas.  Age advanced periventricular and subcortical white matter changes are present bilaterally. No other acute infarct is present. 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 Midline structures are otherwise within normal limits.  Flow is present in the major intracranial arteries. The patient is status post bilateral lens replacements. Mild mucosal thickening is present within the superior aspect of the right maxillary sinus and the right ethmoid air cells. The nasal cavity is clear. A left mastoid effusion is present. No obstructing nasopharyngeal lesion is evident.  MRA HEAD FINDINGS  Internal carotid arteries are within normal limits from the high cervical segments through the ICA termini bilaterally. The left A1 segment is dominant. No significant stenosis is present in the A1 or M1 segments. The MCA bifurcations are intact. ACA and MCA branch vessels are within normal limits.  The right vertebral artery is slightly dominant the left. The PICA origins are visualized and normal. Both posterior  cerebral arteries originate from the basilar tip. There is premature truncation of the right posterior cerebral artery superior segment of the right posterior cerebral artery and 13 mm from the P3 bifurcation.  IMPRESSION: 1. 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 2. Punctate acute nonhemorrhagic infarct involving the left parietal lobe posteriorly. 3. Age advanced periventricular and subcortical white matter changes bilaterally, likely related to chronic microvascular ischemic change. 4. Conclusion of the distal superior segment right posterior cerebral artery as described. 5. Left mastoid effusion. No obstructing nasopharyngeal lesion is evident. These results will be called to the ordering clinician or representative by the Radiologist Assistant, and communication documented in the PACS or zVision Dashboard.   Electronically Signed   By: Lawrence Santiago M.D.   On: 03/03/2014 13:07   Mr Jodene Nam Head/brain Wo Cm  03/03/2014   CLINICAL DATA:  Right-sided weakness and numbness. Tingling sensation.  EXAM: MRI HEAD WITHOUT CONTRAST  MRA HEAD WITHOUT CONTRAST  TECHNIQUE: Multiplanar, multiecho pulse sequences of the brain and surrounding structures were obtained without intravenous contrast. Angiographic images of the head were obtained using MRA technique without contrast.  COMPARISON:  CT head without contrast 03/02/2014  FINDINGS: MRI HEAD FINDINGS  2 separate 5 mm foci of acute nonhemorrhagic infarct are present within the posterior limb of the left internal capsule. There is a  punctate area of restricted diffusion in the posterior left parietal lobe as well. T2 changes are associated with both areas.  Age advanced periventricular and subcortical white matter changes are present bilaterally. No other acute infarct is present. 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 Midline structures are otherwise within normal limits.  Flow is present in the  major intracranial arteries. The patient is status post bilateral lens replacements. Mild mucosal thickening is present within the superior aspect of the right maxillary sinus and the right ethmoid air cells. The nasal cavity is clear. A left mastoid effusion is present. No obstructing nasopharyngeal lesion is evident.  MRA HEAD FINDINGS  Internal carotid arteries are within normal limits from the high cervical segments through the ICA termini bilaterally. The left A1 segment is dominant. No significant stenosis is present in the A1 or M1 segments. The MCA bifurcations are intact. ACA and MCA branch vessels are within normal limits.  The right vertebral artery is slightly dominant the left. The PICA origins are visualized and normal. Both posterior cerebral arteries originate from the basilar tip. There is premature truncation of the right posterior cerebral artery superior segment of the right posterior cerebral artery and 13 mm from the P3 bifurcation.  IMPRESSION: 1. 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 2. Punctate acute nonhemorrhagic infarct involving the left parietal lobe posteriorly. 3. Age advanced periventricular and subcortical white matter changes bilaterally, likely related to chronic microvascular ischemic change. 4. Conclusion of the distal superior segment right posterior cerebral artery as described. 5. Left mastoid effusion. No obstructing nasopharyngeal lesion is evident. These results will be called to the ordering clinician or representative by the Radiologist Assistant, and communication documented in the PACS or zVision Dashboard.   Electronically Signed   By: Lawrence Santiago M.D.   On: 03/03/2014 13:07     PERTINENT LAB RESULTS: CBC:  Recent Labs  03/02/14 0950 03/02/14 2110  WBC  --  8.6  HGB 17.3* 15.3*  HCT 51.0* 45.0  PLT  --  213   CMET CMP     Component Value Date/Time   NA 139 03/02/2014 2110   K 4.0 03/02/2014 2110    CL 100 03/02/2014 2110   CO2 25 03/02/2014 2110   GLUCOSE 316* 03/02/2014 2110   BUN 24* 03/02/2014 2110   CREATININE 1.05 03/02/2014 2110   CALCIUM 9.9 03/02/2014 2110   GFRNONAA 53* 03/02/2014 2110   GFRAA 61* 03/02/2014 2110    GFR Estimated Creatinine Clearance: 47.1 mL/min (by C-G formula based on Cr of 1.05). No results for input(s): LIPASE, AMYLASE in the last 72 hours. No results for input(s): CKTOTAL, CKMB, CKMBINDEX, TROPONINI in the last 72 hours. Invalid input(s): POCBNP No results for input(s): DDIMER in the last 72 hours.  Recent Labs  03/03/14 0536  HGBA1C 6.9*    Recent Labs  03/03/14 0536  CHOL 213*  HDL 72  LDLCALC 129*  TRIG 61  CHOLHDL 3.0   No results for input(s): TSH, T4TOTAL, T3FREE, THYROIDAB in the last 72 hours.  Invalid input(s): FREET3 No results for input(s): VITAMINB12, FOLATE, FERRITIN, TIBC, IRON, RETICCTPCT in the last 72 hours. Coags: No results for input(s): INR in the last 72 hours.  Invalid input(s): PT Microbiology: No results found for this or any previous visit (from the past 240 hour(s)).   BRIEF HOSPITAL COURSE:   Principal Problem:  Acute QMG:NOIBBCWUG thought to be a TIA as symptoms rapidly resolved, however  MRI Brain positive for CVA. Echo was neg for a embolic source, Carotid doppler (prelim) showed Bilateral 1-39% ICA stenosis.Seen by Neuro, recommended to change ASA to 325 mg. To try to see if patient can tolerate Zocor. Spoke with Burnetta Sabin Stroke NP-who spoke with Dr Leonie Man, no further recommendations, ok for discharge. Patient with no focal or speech deficits. Ambulating in the room independently  Active Problems:  Hypertension: BP moderately controlled, will resume all anti-hypertensives on discharge   Hyperlipidemia: LDL 129-not able to tolerate statins due to myalgia, continue Zetia. Spoke with Neuro, recommending Zocor, will ask patient to see if she can tolerate-advised to take at nighttime.   Type 2  diabetes, diet controlled:A1C 6.9, continue with diet and exercise, patient not interested in pursuing medications at this time.   TODAY-DAY OF DISCHARGE:  Subjective:   [name] today has no headache,no chest abdominal pain,no new weakness tingling or numbness, feels much better wants to go home today.   Objective:   Blood pressure 145/55, pulse 76, temperature 98.1 F (36.7 C), temperature source Oral, resp. rate 20, height 5\' 3"  (1.6 m), weight 70.897 kg (156 lb 4.8 oz), SpO2 92 %. No intake or output data in the 24 hours ending 03/03/14 1445 Filed Weights   03/03/14 0000  Weight: 70.897 kg (156 lb 4.8 oz)    Exam Awake Alert, Oriented *3, No new F.N deficits, Normal affect [place].AT,PERRAL Supple Neck,No JVD, No cervical lymphadenopathy appriciated.  Symmetrical Chest wall movement, Good air movement bilaterally, CTAB RRR,No Gallops,Rubs or new Murmurs, No Parasternal Heave +ve B.Sounds, Abd Soft, Non tender, No organomegaly appriciated, No rebound -guarding or rigidity. No Cyanosis, Clubbing or edema, No new Rash or bruise  DISCHARGE CONDITION: Stable  DISPOSITION: Home  DISCHARGE INSTRUCTIONS:    Activity:  As tolerated   Diet recommendation: Diabetic Diet Heart Healthy diet   Discharge Instructions    Call MD for:  persistant dizziness or light-headedness    Complete by:  As directed      Diet - low sodium heart healthy    Complete by:  As directed      Diet Carb Modified    Complete by:  As directed      Increase activity slowly    Complete by:  As directed            Follow-up Information    Follow up with Vidal Schwalbe, MD In 2 days.   Specialty:  Family Medicine   Contact information:   [address] [place] [address] [phone]       Follow up with SETHI,PRAMOD, MD. Schedule an appointment as soon as possible for a visit in 2 months.   Specialties:  Neurology, Radiology   Contact information:   [address] [place] [address] [phone]      Total Time spent on discharge equals 45 minutes.  SignedOren Binet 03/03/2014 2:45 PM

## 2014-03-03 NOTE — Progress Notes (Signed)
STROKE TEAM PROGRESS NOTE   HISTORY Zyionna Pesce is a 68 y.o. female who was in her normal state of health earlier today in 03/02/2014, but has been off of aspirin due to a rectal surgery today. When getting up to go to the surgery, she noticed that her right leg became numb and then that she continued, she noticed that her arm was numb as well. She is having mild difficulty walking but was able to continue, and the symptoms lasted less than 5 minutes so she continued to go through with the surgery. Her husband got her to call her PCP this afternoon to discuss this event, who referred her to the ED. She was last known well 03/02/2014 at 8:30 AM. TPA was not administered secondary to resolved symptoms.  She was admitted for further evaluation and treatment.   SUBJECTIVE (INTERVAL HISTORY) Her daughter is at the bedside.  Overall she feels her condition is stable. She still complains of some numbness.   OBJECTIVE Temp:  [97.7 F (36.5 C)-98.8 F (37.1 C)] 98.1 F (36.7 C) (11/05 1343) Pulse Rate:  [66-95] 76 (11/05 1343) Cardiac Rhythm:  [-] Normal sinus rhythm (11/05 1220) Resp:  [18-22] 20 (11/05 1343) BP: (99-175)/(52-83) 145/55 mmHg (11/05 1343) SpO2:  [91 %-98 %] 92 % (11/05 1343) Weight:  [70.761 kg (156 lb)-70.897 kg (156 lb 4.8 oz)] 70.761 kg (156 lb) (11/05 1135)   Recent Labs Lab 03/02/14 1114 03/03/14 0428 03/03/14 0758 03/03/14 1221  GLUCAP 98 208* 136* 173*    Recent Labs Lab 03/02/14 0950 03/02/14 2110  NA 144 139  K 3.6* 4.0  CL 104 100  CO2  --  25  GLUCOSE 124* 316*  BUN 21 24*  CREATININE 0.80 1.05  CALCIUM  --  9.9   No results for input(s): AST, ALT, ALKPHOS, BILITOT, PROT, ALBUMIN in the last 168 hours.  Recent Labs Lab 03/02/14 0950 03/02/14 2110  WBC  --  8.6  HGB 17.3* 15.3*  HCT 51.0* 45.0  MCV  --  84.4  PLT  --  213   No results for input(s): CKTOTAL, CKMB, CKMBINDEX, TROPONINI in the last 168 hours. No results for input(s): LABPROT,  INR in the last 72 hours. No results for input(s): COLORURINE, LABSPEC, [place], GLUCOSEU, HGBUR, BILIRUBINUR, KETONESUR, PROTEINUR, UROBILINOGEN, NITRITE, LEUKOCYTESUR in the last 72 hours.  Invalid input(s): APPERANCEUR     Component Value Date/Time   CHOL 213* 03/03/2014 0536   TRIG 61 03/03/2014 0536   HDL 72 03/03/2014 0536   CHOLHDL 3.0 03/03/2014 0536   VLDL 12 03/03/2014 0536   LDLCALC 129* 03/03/2014 0536   Lab Results  Component Value Date   HGBA1C 6.9* 03/03/2014      Component Value Date/Time   LABOPIA NONE DETECTED 03/02/2014 2102   COCAINSCRNUR NONE DETECTED 03/02/2014 2102   LABBENZ NONE DETECTED 03/02/2014 2102   AMPHETMU NONE DETECTED 03/02/2014 2102   THCU NONE DETECTED 03/02/2014 2102   LABBARB NONE DETECTED 03/02/2014 2102    No results for input(s): ETH in the last 168 hours.  Dg Chest 2 View  03/02/2014   CLINICAL DATA:  Right leg numbness, right arm numbness, and cough today. Previous smoker.  EXAM: CHEST  2 VIEW  COMPARISON:  05/16/2008  FINDINGS: Mild hyperinflation of the lungs. Normal heart size and pulmonary vascularity. No focal airspace disease or consolidation in the lungs. No blunting of costophrenic angles. No pneumothorax. Mediastinal contours appear intact. Postoperative changes in the cervical spine. Calcification of the  aorta.  IMPRESSION: Pulmonary hyperinflation.  No active cardiopulmonary disease.   Electronically Signed   By: Lucienne Capers M.D.   On: 03/02/2014 21:38   Ct Head Wo Contrast  03/02/2014   CLINICAL DATA:  Right-sided numbness occurring earlier in the day. Hypertension  EXAM: CT HEAD WITHOUT CONTRAST  TECHNIQUE: Contiguous axial images were obtained from the base of the skull through the vertex without intravenous contrast.  COMPARISON:  None.  FINDINGS: Ventricles are normal in size and configuration. Enlargement of the cisterna magna is an anatomic variant. There is no mass, hemorrhage, extra-axial fluid collection, or midline  shift. There is rather minimal periventricular small vessel disease in the centra semiovale bilaterally. There is no demonstrable acute infarct. Bony calvarium appears intact. Mastoids on the right are clear. There is opacification of multiple mastoid air cells on the left. No air-fluid levels seen.  IMPRESSION: Minimal periventricular small vessel disease. No intracranial mass, hemorrhage, or acute appearing infarct. Opacification of multiple mastoids on the left, age uncertain. Mastoids on the right are clear.   Electronically Signed   By: Lowella Grip M.D.   On: 03/02/2014 21:44   Mr Brain Wo Contrast  03/03/2014   CLINICAL DATA:  Right-sided weakness and numbness. Tingling sensation.  EXAM: MRI HEAD WITHOUT CONTRAST  MRA HEAD WITHOUT CONTRAST  TECHNIQUE: Multiplanar, multiecho pulse sequences of the brain and surrounding structures were obtained without intravenous contrast. Angiographic images of the head were obtained using MRA technique without contrast.  COMPARISON:  CT head without contrast 03/02/2014  FINDINGS: MRI HEAD FINDINGS  2 separate 5 mm foci of acute nonhemorrhagic infarct are present within the posterior limb of the left internal capsule. There is a punctate area of restricted diffusion in the posterior left parietal lobe as well. T2 changes are associated with both areas.  Age advanced periventricular and subcortical white matter changes are present bilaterally. No other acute infarct is present. 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 Midline structures are otherwise within normal limits.  Flow is present in the major intracranial arteries. The patient is status post bilateral lens replacements. Mild mucosal thickening is present within the superior aspect of the right maxillary sinus and the right ethmoid air cells. The nasal cavity is clear. A left mastoid effusion is present. No obstructing nasopharyngeal lesion is evident.  MRA HEAD FINDINGS  Internal  carotid arteries are within normal limits from the high cervical segments through the ICA termini bilaterally. The left A1 segment is dominant. No significant stenosis is present in the A1 or M1 segments. The MCA bifurcations are intact. ACA and MCA branch vessels are within normal limits.  The right vertebral artery is slightly dominant the left. The PICA origins are visualized and normal. Both posterior cerebral arteries originate from the basilar tip. There is premature truncation of the right posterior cerebral artery superior segment of the right posterior cerebral artery and 13 mm from the P3 bifurcation.  IMPRESSION: 1. 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 2. Punctate acute nonhemorrhagic infarct involving the left parietal lobe posteriorly. 3. Age advanced periventricular and subcortical white matter changes bilaterally, likely related to chronic microvascular ischemic change. 4. Conclusion of the distal superior segment right posterior cerebral artery as described. 5. Left mastoid effusion. No obstructing nasopharyngeal lesion is evident. These results will be called to the ordering clinician or representative by the Radiologist Assistant, and communication documented in the PACS or zVision Dashboard.   Electronically Signed  By: Lawrence Santiago M.D.   On: 03/03/2014 13:07   Mr Jodene Nam Head/brain Wo Cm  03/03/2014   CLINICAL DATA:  Right-sided weakness and numbness. Tingling sensation.  EXAM: MRI HEAD WITHOUT CONTRAST  MRA HEAD WITHOUT CONTRAST  TECHNIQUE: Multiplanar, multiecho pulse sequences of the brain and surrounding structures were obtained without intravenous contrast. Angiographic images of the head were obtained using MRA technique without contrast.  COMPARISON:  CT head without contrast 03/02/2014  FINDINGS: MRI HEAD FINDINGS  2 separate 5 mm foci of acute nonhemorrhagic infarct are present within the posterior limb of the left internal capsule.  There is a punctate area of restricted diffusion in the posterior left parietal lobe as well. T2 changes are associated with both areas.  Age advanced periventricular and subcortical white matter changes are present bilaterally. No other acute infarct is present. 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 Midline structures are otherwise within normal limits.  Flow is present in the major intracranial arteries. The patient is status post bilateral lens replacements. Mild mucosal thickening is present within the superior aspect of the right maxillary sinus and the right ethmoid air cells. The nasal cavity is clear. A left mastoid effusion is present. No obstructing nasopharyngeal lesion is evident.  MRA HEAD FINDINGS  Internal carotid arteries are within normal limits from the high cervical segments through the ICA termini bilaterally. The left A1 segment is dominant. No significant stenosis is present in the A1 or M1 segments. The MCA bifurcations are intact. ACA and MCA branch vessels are within normal limits.  The right vertebral artery is slightly dominant the left. The PICA origins are visualized and normal. Both posterior cerebral arteries originate from the basilar tip. There is premature truncation of the right posterior cerebral artery superior segment of the right posterior cerebral artery and 13 mm from the P3 bifurcation.  IMPRESSION: 1. 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 2. Punctate acute nonhemorrhagic infarct involving the left parietal lobe posteriorly. 3. Age advanced periventricular and subcortical white matter changes bilaterally, likely related to chronic microvascular ischemic change. 4. Conclusion of the distal superior segment right posterior cerebral artery as described. 5. Left mastoid effusion. No obstructing nasopharyngeal lesion is evident. These results will be called to the ordering clinician or representative  by the Radiologist Assistant, and communication documented in the PACS or zVision Dashboard.   Electronically Signed   By: Lawrence Santiago M.D.   On: 03/03/2014 13:07   2D Echocardiogram  EF 60-65% with no source of embolus.   Carotid Doppler  There is 1-39% bilateral ICA stenosis. Vertebral artery flow is antegrade.     PHYSICAL EXAM Pleasant elderly Caucasian lady.Awake alert. Afebrile. Head is nontraumatic. Neck is supple without bruit. Hearing is normal. Cardiac exam no murmur or gallop. Lungs are clear to auscultation. Distal pulses are well felt. Neurological Exam ;  Awake  Alert oriented x 3. Normal speech and language.eye movements full without nystagmus.fundi were not visualized. Vision acuity and fields appear normal. Hearing is normal. Palatal movements are normal. Face symmetric. Tongue midline. Normal strength, tone, reflexes and coordination. Normal sensation but subjective paresthesias right hemibody. Gait deferred. ASSESSMENT/PLAN Ms. Arlyce Circle is a 68 y.o. female with history of diabetes, hypertension, hyperlipidemia, who presented with right arm and leg numbness. She did not receive IV t-PA due to resolving symptoms and recent surgery day of admission.   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 L mastoid effusion.  Carotid Doppler  No significant stenosis   2D Echo  No source of embolus   Heparin 5000 units sq tid for VTE prophylaxis  Diet heart healthy/carb modified thin liquids  aspirin 81 mg orally every day prior to admission (though had been stopped for procedure), now on aspirin 81 mg orally every day. Recommend increase to 325 mg daily. Order written.  Patient counseled to be compliant with her  antithrombotic medications  Ongoing aggressive risk factor management  Therapy recommendations:  No therapy needs  Disposition:  Return home with husband  Continue exercise regimen as able  Stay hydrated  Hypertension  Stable  Patient counseled to be compliant with her blood pressure medications  Continue home BP medications.  Hyperlipidemia  Home meds:  zetia, resumed in hospital  LDL 129, goal < 70  Has been intolerant to statins in the past including Lipitor and Crestor  Recommend trying other statins - if she fails these as well, then consider new injectable lipid lowering medicines Praluent or [place] as on OP (must have documented failure of statins)  Will add zocor 20 mg and Continue Zetia at discharge  Diabetes  HgbA1c pending goal < 7.0  Other Stroke Risk Factors Advanced age A former cigarette smoker, quit 22 years ago . Obesity, Body mass index is 27.69 kg/(m^2).  [place] Kitchen Family hx stroke (father)  Other Active Problems  Status post hemorrhoidopexy 03/02/2014 secondary to bleeding hemorrhoids  L mastoid effusion - follow up with primary MD Harlan Stains in 1-2 weeks  Hospital day # 1  Burnetta Sabin, MSN, APRN, ANVP-BC, AGPCNP-BC [facility] Pager: [phone] 03/03/2014 2:08 PM  I have personally examined this patient, reviewed notes, independently viewed imaging studies, participated in medical decision making and plan of care. I have made any additions or clarifications directly to the above note. Agree with note above. I had a long discussion with the patient and daughter regards to her risk for recurrent TIA/strokes, personally reviewed imaging studies and lab work and answered questions. I stressed the need for aggressive secondary stroke prevention and lipid-lowering. If she remains statin intolerant may need to consider new injectable lipid-lowering medicines  Antony Contras, MD Medical Director [place] Pager:  [phone] 03/03/2014 3:31 PM    To contact Stroke Continuity provider, please refer to [email or web address] After hours, contact General Neurology

## 2014-03-03 NOTE — Progress Notes (Addendum)
PT Cancellation Note  Patient Details Name: Yamaira Spinner MRN: [id number] DOB: 07-Nov-1945   Cancelled Treatment:    Reason Eval/Treat Not Completed: Patient at procedure or test/unavailable Pt just arrived back in room from ECHO.  She is now leaving for MRI.  Pt also with order for bedrest.  Please update activity order when appropriate for PT to proceed with eval.   Lorriane Shire 03/03/2014, 11:21 AM

## 2014-03-03 NOTE — Progress Notes (Signed)
10 and 12 neuro assessment wasn't done because pt was unavaliable

## 2014-03-03 NOTE — Plan of Care (Signed)
Problem: Progression Outcomes Goal: Communication method established Outcome: Completed/Met Date Met:  03/03/14 Goal: Progressive activity as tolerated Outcome: Completed/Met Date Met:  03/03/14 Goal: Pain controlled Outcome: Completed/Met Date Met:  03/03/14 Goal: Bowel & Bladder Continence Outcome: Completed/Met Date Met:  03/03/14 Goal: Educational plan initiated Outcome: Completed/Met Date Met:  03/03/14

## 2014-03-15 ENCOUNTER — Ambulatory Visit: Payer: Medicare Other | Attending: Family Medicine | Admitting: Occupational Therapy

## 2014-03-15 DIAGNOSIS — Z5189 Encounter for other specified aftercare: Secondary | ICD-10-CM | POA: Diagnosis not present

## 2014-03-15 DIAGNOSIS — R208 Other disturbances of skin sensation: Secondary | ICD-10-CM | POA: Diagnosis not present

## 2014-03-15 DIAGNOSIS — R531 Weakness: Secondary | ICD-10-CM | POA: Insufficient documentation

## 2014-03-15 DIAGNOSIS — I69898 Other sequelae of other cerebrovascular disease: Secondary | ICD-10-CM | POA: Insufficient documentation

## 2014-03-15 DIAGNOSIS — R209 Unspecified disturbances of skin sensation: Secondary | ICD-10-CM

## 2014-03-15 DIAGNOSIS — IMO0002 Reserved for concepts with insufficient information to code with codable children: Secondary | ICD-10-CM

## 2014-03-15 NOTE — Therapy (Signed)
Occupational Therapy Evaluation  Patient Details  Name: Sharelle Burditt MRN: [id number] Date of Birth: 1945/09/23  Encounter Date: 03/15/2014      OT End of Session - 03/15/14 1347    Visit Number 1   Number of Visits 9   Date for OT Re-Evaluation 04/08/14   Authorization Type Blue M[employer]   OT Start Time 1151   OT Stop Time 1230   OT Time Calculation (min) 39 min   Activity Tolerance Patient tolerated treatment well   Behavior During Therapy [facility] for tasks assessed/performed      Past Medical History  Diagnosis Date  . Hypertension   . Hyperlipidemia   . Vitamin D deficiency   . Type 2 diabetes, diet controlled   . Bleeding internal hemorrhoids   . Wears glasses     Past Surgical History  Procedure Laterality Date  . Lumbar spine surgery  1970's  . Abdominal hysterectomy  1980's  . Laparoscopic cholecystectomy  1995  . Plantar fascia surgery Right 2003  . Anterior cervical decomp/discectomy fusion  05-19-2008    C6 -- C7  . Shoulder arthroscopy/  debridement labrum and rotator cuff/ bursectomy/ acromioplasty/  cal release/  excision distal clavical Bilateral right 09-15-2008/   left  10-17-2008  . Cataract extraction w/ intraocular lens  implant, bilateral  2011  . Hemorrhoid surgery N/A 03/02/2014    Procedure: HEMORRHOIDOPEXY;  Surgeon: Leighton Ruff, MD;  Location: [facility];  Service: General;  Laterality: N/A;    There were no vitals taken for this visit.  Visit Diagnosis:  Weakness due to cerebrovascular accident - Plan: Ot plan of care cert/re-cert  Sensory disturbance - Plan: Ot plan of care cert/re-cert      Subjective Assessment - 03/15/14 1158    Symptoms Pt presents with right sided tightness and numbness   Patient Stated Goals Pt wants to decrease numbness and increase functional use of dominant RUE   Currently in Pain? Yes   Pain Score 2    Pain Location Hand   Pain Orientation Right   Pain Descriptors / Indicators Aching;Numbness    Pain Type Acute pain   Pain Onset 1 to 4 weeks ago   Aggravating Factors  inactivity   Pain Relieving Factors movement   Effect of Pain on Daily Activities Pt reports increased difficulty using RUE functionally due to sensory changes.   Multiple Pain Sites No          [facility] OT Assessment - 03/15/14 0001    Assessment   Diagnosis CVA   Onset Date 03/02/14   Assessment Pt stopped taking aspirin in prep for hemorrhoid surgery. Pt experienced R sided wekness that resolved prior to surgery. After surgery pt developed R sided weakness agiain and was hospitalized.   Prior Therapy no   Precautions   Precautions None   Balance Screen   Has the patient fallen in the past 6 months No   Has the patient had a decrease in activity level because of a fear of falling?  No   Is the patient reluctant to leave their home because of a fear of falling?  No   Home  Environment   Family/patient expects to be discharged to: Private residence   Living Arrangements Spouse/significant other   Available Help at Discharge Family   Type of [place] One level   Bathroom Shower/Tub Walk-in Shower   Bathroom Toilet Handicapped height   [place] Accessibility  Yes   [place] - 2 wheels;Walker - 4 wheels   Lives With Spouse   Prior Function   Level of Independence Independent with basic ADLs  I with homemaking   ADL   Eating/Feeding Modified independent   Grooming Modified independent   Upper Body Bathing Modified independent   Lower Body Bathing Modified independent   Upper Body Dressing Independent   Lower Body Dressing Independent   Toilet Tranfer Independent   Toileting -  Hygiene Independent   Tub/Shower Transfer Modified independent   IADL   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   Additional Comments Pt reports concerns over safety for cutting.   Coordination   9 Hole Peg Test (p) LUE 20.09 secs, RUE 21.44 secs   Other RUE   Right 9 Hole Peg Test --   Left 9 Hole Peg Test --   AROM   Overall AROM  Within functional limits for tasks performed   Hand Function   Left Hand Gross Grasp --  RUE 45 lbs, LUE 46 lbs                OT Long Term Goals - 04/07/2014 1511    OT LONG TERM GOAL #1   Title  I with HEP   (check 04/13/14)   Time 4   Period Weeks   Status New   OT LONG TERM GOAL #2   Title Pt will report that she has resumed prior level of home management, and cooking at a modified independent level demonstrating good safety awareness.(check 04/13/14)   Time 4   Period Weeks   Status New   OT LONG TERM GOAL #3   Title Pt will resume use of RUE as her dominant hand at least 90% of the time with painless than or equal to 2/10. (check 04/13/14)   Time 4   Period Weeks   Status New          Plan - 2014/04/07 1349    Clinical Impression Statement Pt s/p CVA presents with decreased RUE coordination, strength and proprioception which impedes her RUE functional use and performance of ADLS/IADLS.   Rehab Potential Good   Clinical Impairments Affecting Rehab Potential decreased RUE strength, coordination, proprioception   OT Frequency 2x / week  plus eval   OT Duration 4 weeks   OT Treatment/Interventions Electrical Stimulation;Therapeutic exercise;Cognitive remediation/compensation;Patient/family education;Therapeutic  exercises;Functional Mobility Training;Fluidtherapy;Ultrasound;Contrast Bath;DME and/or AE instruction;Manual Therapy;Passive range of motion;Therapeutic activities;Balance training;Splinting;Parrafin;Moist Heat   Plan HEP for proximal strength/ coordination   Recommended Other Services PT for balance   Consulted and Agree with Plan of Care Patient          G-Codes - 04-07-2014 1521    Functional Assessment Tool Used currently has not resumed home management and pt is only performing light cooking   Functional Limitation Self care   Self Care Current Status [phone])  At least 20 percent but less than 40 percent impaired, limited or restricted   Self Care Goal Status (U6333) At least 1 percent but less than 20 percent impaired, limited or restricted      Problem List Patient Active Problem List   Diagnosis Date Noted  . Essential hypertension   . TIA (transient ischemic attack) 03/02/2014  . Hypertension   . Hyperlipidemia   . Type 2 diabetes, diet controlled                                              Theone Murdoch, OTR/L  Liandra Mendia 03/15/2014, 3:33 PM

## 2014-03-17 ENCOUNTER — Ambulatory Visit: Payer: Medicare Other | Admitting: Occupational Therapy

## 2014-03-21 ENCOUNTER — Ambulatory Visit: Payer: Medicare Other | Admitting: Occupational Therapy

## 2014-03-21 ENCOUNTER — Encounter: Payer: Self-pay | Admitting: Occupational Therapy

## 2014-03-21 DIAGNOSIS — IMO0002 Reserved for concepts with insufficient information to code with codable children: Secondary | ICD-10-CM

## 2014-03-21 DIAGNOSIS — R209 Unspecified disturbances of skin sensation: Secondary | ICD-10-CM

## 2014-03-21 DIAGNOSIS — Z5189 Encounter for other specified aftercare: Secondary | ICD-10-CM | POA: Diagnosis not present

## 2014-03-21 NOTE — Therapy (Signed)
Occupational Therapy Treatment  Patient Details  Name: [name] MRN: [id number] Date of Birth: 1946/01/13  Encounter Date: 03/21/2014      OT End of Session - 03/21/14 1506    Visit Number 2  2/10 G   Number of Visits 9   Date for OT Re-Evaluation 04/08/14   Authorization Type Blue MCR   OT Start Time 1318   OT Stop Time 1358   OT Time Calculation (min) 40 min   Activity Tolerance Patient tolerated treatment well      Past Medical History  Diagnosis Date  . Hypertension   . Hyperlipidemia   . Vitamin D deficiency   . Type 2 diabetes, diet controlled   . Bleeding internal hemorrhoids   . Wears glasses     Past Surgical History  Procedure Laterality Date  . Lumbar spine surgery  1970's  . Abdominal hysterectomy  1980's  . Laparoscopic cholecystectomy  1995  . Plantar fascia surgery Right 2003  . Anterior cervical decomp/discectomy fusion  05-19-2008    C6 -- C7  . Shoulder arthroscopy/  debridement labrum and rotator cuff/ bursectomy/ acromioplasty/  cal release/  excision distal clavical Bilateral right 09-15-2008/   left  10-17-2008  . Cataract extraction w/ intraocular lens  implant, bilateral  2011  . Hemorrhoid surgery N/A 03/02/2014    Procedure: HEMORRHOIDOPEXY;  Surgeon: Leighton Ruff, MD;  Location: [facility];  Service: General;  Laterality: N/A;    There were no vitals taken for this visit.  Visit Diagnosis:  Weakness due to cerebrovascular accident  Sensory disturbance      Subjective Assessment - 03/21/14 1318    Symptoms Pt reports hand is a little better, but still tight and numb   Currently in Pain? No/denies            OT Treatments/Exercises (OP) - 03/21/14 0001    ADLs   Functional Mobility Pt ambulated while carrying plate (with loose objects) and cup of water in the other (then switched hands). Pt with no spills or LOB.      ADL Comments Reviewed basic compensation for sensory deficits:  use of oven mitt, vision  to compensate, anti-cut gloves, check temp with other hand, use of AE for chopping when cooking.  Pt verbalized understanding.   Exercises   Exercises Neurological Re-education   Grasp and Release   Grasp and Release --  completed Purdue pegboard with good accuracy   Fine Motor Coordination   Fine Motor Coordination Stereognosis;Sensory Retraining   Stereognosis pulling items from rice bath with good accuracy.   Sensory Retraining O'connor pegboard completed with tweezers with min difficulty.          OT Education - 03/21/14 1344    Education provided Yes   Education Details Coordination, Red theraband HEP   Person(s) Educated Patient   Methods Explanation;Demonstration;Verbal cues   Comprehension Verbalized understanding;Returned demonstration              Plan - 03/21/14 1401    Clinical Impression Statement pt progressing well with RUE functional use and is compensating for sensory deficits   Plan review HEP prn, ?cooking task        Problem List Patient Active Problem List   Diagnosis Date Noted  . Essential hypertension   . TIA (transient ischemic attack) 03/02/2014  . Hypertension   . Hyperlipidemia   . Type 2 diabetes, diet controlled  [name], OTR/L 03/21/2014 3:19 PM Phone: [phone] Fax: [phone]        [facility] 03/21/2014, 3:19 PM

## 2014-03-21 NOTE — Patient Instructions (Signed)
  Coordination Activities  Perform the following activities for 20-30 minutes 1 times per day with right hand(s).   Rotate ball in fingertips (clockwise and counter-clockwise).  Toss ball between hands.  Toss ball in air and catch with the same hand.  Flip cards 1 at a time as fast as you can.  Deal cards with your thumb (Hold deck in hand and push card off top with thumb).  Rotate card in hand (clockwise and counter-clockwise).  Pick up coins and place in container or coin bank.  Pick up coins and stack.  Pick up coins one at a time until you get 5-10 in your hand, then move coins from palm to fingertips to stack one at a time.      Strengthening: Resisted Flexion   Hold tubing with right arm at side. Pull forward and up. Move shoulder through pain-free range of motion. Repeat 15 times per set.  Do 1-2 sessions per day.    Strengthening: Resisted Extension   Hold tubing in right hand, arm forward. Pull arm back, elbow straight. Repeat 15 times per set. Do 1-2 sessions per day.   Resisted Horizontal Abduction: Bilateral   Sit or stand, tubing in both hands, arms out in front. Keeping arms straight, pinch shoulder blades together and stretch arms out. Repeat 15 times per set.  Do 1-2 sessions per day.   Elbow Flexion: Resisted   Hold tubing wrapped around  Hand and other end secured under foot, curl arm up as far as possible. Repeat 15 times per set.  Do 1-2 sessions per day.    Elbow Extension: Resisted   Hold band in left hand with elbow bent. Straighten  Right elbow. Repeat 15 times per set.  Do 1-2 sessions per day.   Copyright  VHI. All rights reserved.

## 2014-03-22 ENCOUNTER — Ambulatory Visit: Payer: Medicare Other | Admitting: Rehabilitative and Restorative Service Providers"

## 2014-03-28 ENCOUNTER — Other Ambulatory Visit: Payer: Self-pay | Admitting: Internal Medicine

## 2014-03-29 ENCOUNTER — Telehealth: Payer: Self-pay | Admitting: Neurology

## 2014-03-29 ENCOUNTER — Ambulatory Visit: Payer: Medicare Other | Attending: Family Medicine | Admitting: Occupational Therapy

## 2014-03-29 DIAGNOSIS — Z5189 Encounter for other specified aftercare: Secondary | ICD-10-CM | POA: Insufficient documentation

## 2014-03-29 DIAGNOSIS — R531 Weakness: Secondary | ICD-10-CM | POA: Insufficient documentation

## 2014-03-29 DIAGNOSIS — I69898 Other sequelae of other cerebrovascular disease: Secondary | ICD-10-CM | POA: Insufficient documentation

## 2014-03-29 DIAGNOSIS — R208 Other disturbances of skin sensation: Secondary | ICD-10-CM | POA: Insufficient documentation

## 2014-03-29 NOTE — Telephone Encounter (Signed)
Patient stated she can't tolerate Rx simvastatin (ZOCOR) 20 MG tablet.  Experiencing nausea and Lathargic.  Please call and advise.

## 2014-03-30 NOTE — Telephone Encounter (Signed)
These are uncommon side effects of simvastatin. Ask patient to see primary MD to evaluate

## 2014-03-31 NOTE — Telephone Encounter (Signed)
Called patient back and informed patient of Dr Clydene Fake  Message, patient states that she tolerates Zetia fine with no problems but has problems with the Zocor, patient states that if her insurance covered it she would like to try the Praluent or [place] as you mentioned in the hospital.

## 2014-03-31 NOTE — Telephone Encounter (Signed)
Called patient and left voice message to call the office back. 

## 2014-04-01 ENCOUNTER — Ambulatory Visit: Payer: Medicare Other | Admitting: Occupational Therapy

## 2014-04-02 NOTE — Telephone Encounter (Signed)
Ask primary MD to decide on Zetia versus these new medicines

## 2014-04-05 ENCOUNTER — Ambulatory Visit: Payer: Medicare Other | Admitting: *Deleted

## 2014-04-05 NOTE — Telephone Encounter (Signed)
Spoke with patient, informed her of Dr Clydene Fake message, patient verbalized understanding and will contact PCP.

## 2014-05-30 ENCOUNTER — Encounter: Payer: Self-pay | Admitting: Neurology

## 2014-05-30 ENCOUNTER — Ambulatory Visit (INDEPENDENT_AMBULATORY_CARE_PROVIDER_SITE_OTHER): Payer: PPO | Admitting: Neurology

## 2014-05-30 VITALS — BP 118/73 | HR 68 | Ht 63.0 in | Wt 154.8 lb

## 2014-05-30 DIAGNOSIS — R200 Anesthesia of skin: Secondary | ICD-10-CM

## 2014-05-30 NOTE — Progress Notes (Signed)
[facility] [address] [place] [address] [phone]       OFFICE FOLLOW-UP NOTE  [name] Date of Birth:  1945/05/02 Medical Record Number:  [id number]   HPI: 51 year lady seen today for the first following recent hospital admission for TIA. In November 2015 She states she had another episode a few weeks ago when she got up in the morning she had a bad headache with right leg numbness followed by right arm numbness lasting less than 10 minutes. She did not seek medical help at that time. 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 MRA showed no nodules and intracranial stenosis or occlusion. LDL cholesterol is elevated at 129 but patient had history of statin intolerance and was continued on Zetia. Blood pressure is inadequately controlled. She was previously on aspirin 81 the dose was increased to 325 which she is tolerating well without any side effects. Now she noticed that the numbness in the right leg has improved but she still has some residual numbness in the right upper extremity and back of her chest. She is independent in activities of daily living. She had lipid profile checked recently by her primary physician but I do not have those results.  ROS:   14 system review of systems is positive for numbness, snoring, frequent waking, diffculty falling asleep, aching muscles, muscle cramps, joint pain, memory loss, cold intolerance and all other systems negative.  PMH:  Past Medical History  Diagnosis Date  . Hypertension   . Hyperlipidemia   . Vitamin D deficiency   . Type 2 diabetes, diet controlled   . Bleeding internal hemorrhoids   . Wears glasses     Social History:  History   Social History  . Marital Status: Married    Spouse Name: N/A    Number of Children: N/A  . Years of Education: N/A   Occupational History  . Not on file.   Social History Main Topics  .  Smoking status: Former Smoker -- 1.50 packs/day for 30 years    Types: Cigarettes    Quit date: 02/29/1992  . Smokeless tobacco: Never Used  . Alcohol Use: No  . Drug Use: No  . Sexual Activity: Not on file   Other Topics Concern  . Not on file   Social History Narrative    Medications:   Current Outpatient Prescriptions on File Prior to Visit  Medication Sig Dispense Refill  . aspirin 325 MG EC tablet Take 1 tablet (325 mg total) by mouth every morning. 30 tablet 0  . Cholecalciferol (VITAMIN D3) 50000 UNITS CAPS Take 1 capsule by mouth once a week.    . diltiazem (DILT-XR) 240 MG 24 hr capsule Take 240 mg by mouth every morning.    . ezetimibe (ZETIA) 10 MG tablet Take 10 mg by mouth every morning.    Marland Kitchen lisinopril-hydrochlorothiazide (PRINZIDE,ZESTORETIC) 20-12.5 MG per tablet Take 1 tablet by mouth every morning.    . simvastatin (ZOCOR) 20 MG tablet Take 1 tablet (20 mg total) by mouth daily at 6 PM. 30 tablet 0   No current facility-administered medications on file prior to visit.    Allergies:   Allergies  Allergen Reactions  . Vicodin [Hydrocodone-Acetaminophen] Swelling  . Crestor [Rosuvastatin Calcium] Other (See Comments)    MYALGIA  . Lipitor [Atorvastatin Calcium] Other (See Comments)    MYALGIA    Physical Exam General: well developed,  well nourished elderly lady, seated, in no evident distress Head: head normocephalic and atraumatic.  Neck: supple with no carotid or supraclavicular bruits Cardiovascular: regular rate and rhythm, no murmurs Musculoskeletal: no deformity Skin:  no rash/petichiae Vascular:  Normal pulses all extremities Filed Vitals:   05/30/14 0955  BP: 118/73  Pulse: 68   Neurologic Exam Mental Status: Awake and fully alert. Oriented to place and time. Recent and remote memory intact. Attention span, concentration and fund of knowledge appropriate. Mood and affect appropriate.  Cranial Nerves: Fundoscopic exam reveals sharp disc  margins. Pupils equal, briskly reactive to light. Extraocular movements full without nystagmus. Visual fields full to confrontation. Hearing intact. Facial sensation intact. Face, tongue, palate moves normally and symmetrically.  Motor: Normal bulk and tone. Normal strength in all tested extremity muscles. Sensory.: intact to touch ,pinprick .position and vibratory sensation. Subjective paresthesias RUE and back Coordination: Rapid alternating movements normal in all extremities. Finger-to-nose and heel-to-shin performed accurately bilaterally. Gait and Station: Arises from chair without difficulty. Stance is normal. Gait demonstrates normal stride length and balance . Able to heel, toe and tandem walk without difficulty.  Reflexes: 1+ and symmetric. Toes downgoing.   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 Vascular risk factors of HT, Hyperlipidimia, borderline diabetes and age    PLAN: I had a long d/w patient about her recent stroke, risk for recurrent stroke/TIAs, personally independently reviewed imaging studies and stroke evaluation results and answered questions.Continue aspirin 325 mg orally every day  for secondary stroke prevention and maintain strict control of hypertension with blood pressure goal below 130/90, diabetes with hemoglobin A1c goal below 6.5% and lipids with LDL cholesterol goal below 100 mg/dL. Followup in the future with Ward Givens, NP in 3 months or call earlier if necessary.   Note: This document was prepared with digital dictation and possible smart phrase technology. Any transcriptional errors that result from this process are unintentional

## 2014-05-30 NOTE — Patient Instructions (Signed)
I had a long d/w patient about her recent stroke, risk for recurrent stroke/TIAs, personally independently reviewed imaging studies and stroke evaluation results and answered questions.Continue aspirin 325 mg orally every day  for secondary stroke prevention and maintain strict control of hypertension with blood pressure goal below 130/90, diabetes with hemoglobin A1c goal below 6.5% and lipids with LDL cholesterol goal below 100 mg/dL. Followup in the future with Ward Givens, NP in 3 months or call earlier if necessary.

## 2014-08-29 ENCOUNTER — Encounter: Payer: Self-pay | Admitting: Nurse Practitioner

## 2014-08-29 ENCOUNTER — Ambulatory Visit (INDEPENDENT_AMBULATORY_CARE_PROVIDER_SITE_OTHER): Payer: PPO | Admitting: Nurse Practitioner

## 2014-08-29 VITALS — BP 140/81 | HR 71 | Ht 63.0 in | Wt 156.0 lb

## 2014-08-29 DIAGNOSIS — E785 Hyperlipidemia, unspecified: Secondary | ICD-10-CM

## 2014-08-29 DIAGNOSIS — E119 Type 2 diabetes mellitus without complications: Secondary | ICD-10-CM | POA: Diagnosis not present

## 2014-08-29 DIAGNOSIS — G459 Transient cerebral ischemic attack, unspecified: Secondary | ICD-10-CM | POA: Diagnosis not present

## 2014-08-29 DIAGNOSIS — I10 Essential (primary) hypertension: Secondary | ICD-10-CM

## 2014-08-29 NOTE — Patient Instructions (Signed)
Continue aspirin for secondary stroke prevention Strict control of blood pressure with goal 130/90 or below Hemoglobin A1c 6.5 or less Total cholesterol less than 200  LDL cholesterol less than 80 Continue walking everyday for exercise and overall health and well-being Follow-up in 6 months

## 2014-08-29 NOTE — Progress Notes (Signed)
[facility]  PATIENT: [name] DOB: [birth year]   REASON FOR VISIT: Follow-up for history of stroke ,TIA HISTORY FROM: Patient    HISTORY OF PRESENT ILLNESS HISTORY: 19 year lady seen today for the first following recent hospital admission for TIA. In November 2015 She states she had another episode a few weeks ago when she got up in the morning she had a bad headache with right leg numbness followed by right arm numbness lasting less than 10 minutes. She did not seek medical help at that time. 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 MRA showed no nodules and intracranial stenosis or occlusion. LDL cholesterol is elevated at 129 but patient had history of statin intolerance and was continued on Zetia. Blood pressure is inadequately controlled. She was previously on aspirin 81 the dose was increased to 325 which she is tolerating well without any side effects. Now she noticed that the numbness in the right leg has improved but she still has some residual numbness in the right upper extremity and back of her chest. She is independent in activities of daily living. She had lipid profile checked recently by her primary physician but I do not have those results.  UPDATE 08/29/2014 [name], 69 year old female returns for follow-up. She was last seen in this office 05/30/2014 by Dr. Leonie Man. 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 She is currently on aspirin 325 daily without further stroke or TIA symptoms since last seen. Her most recent hemoglobin A1c 6.9. Lipid profile with cholesterol 213 and LDL 129. She also has a history of hypertension and her reading is 140/81 in the office today however she did not take her blood pressure medicines morning she also has hyperlipidemia. She tells me she is walking 3 miles 4-5 times a week and she was  congratulated on this. Unfortunately she has  Gained  Back weight that she lost. She returns for reevaluation. She continues to complain of numbness in the right upper extremity which is residual from her previous stroke.  REVIEW OF SYSTEMS: Full 14 system review of systems performed and notable only for those listed, all others are neg:  Constitutional: neg  Cardiovascular: neg Ear/Nose/Throat: neg  Skin: neg Eyes: neg Respiratory: neg Gastroitestinal: neg  Hematology/Lymphatic: neg  Endocrine: neg Musculoskeletal:neg Allergy/Immunology: neg Neurological: neg Psychiatric: neg Sleep : neg   ALLERGIES: Allergies  Allergen Reactions  . Vicodin [Hydrocodone-Acetaminophen] Swelling  . Crestor [Rosuvastatin Calcium] Other (See Comments)    MYALGIA  . Lipitor [Atorvastatin Calcium] Other (See Comments)    MYALGIA    HOME MEDICATIONS: Outpatient Prescriptions Prior to Visit  Medication Sig Dispense Refill  . aspirin 325 MG EC tablet Take 1 tablet (325 mg total) by mouth every morning. 30 tablet 0  . Cholecalciferol (VITAMIN D3) 50000 UNITS CAPS Take 1 capsule by mouth once a week.    . diltiazem (DILT-XR) 240 MG 24 hr capsule Take 240 mg by mouth every morning.    . ezetimibe (ZETIA) 10 MG tablet Take 10 mg by mouth every morning.    . fluticasone (FLONASE) 50 MCG/ACT nasal spray Place 1 spray into both nostrils as needed.  0  . lisinopril-hydrochlorothiazide (PRINZIDE,ZESTORETIC) 20-12.5 MG per tablet Take 1 tablet by mouth every morning.    Marland Kitchen LORazepam (ATIVAN) 0.5 MG tablet Take 5 mg by mouth as needed.  0  .  simvastatin (ZOCOR) 20 MG tablet Take 1 tablet (20 mg total) by mouth daily at 6 PM. 30 tablet 0   No facility-administered medications prior to visit.    PAST MEDICAL HISTORY: Past Medical History  Diagnosis Date  . Hypertension   . Hyperlipidemia   . Vitamin D deficiency   . Type 2 diabetes, diet controlled   . Bleeding internal hemorrhoids   . Wears glasses      PAST SURGICAL HISTORY: Past Surgical History  Procedure Laterality Date  . Lumbar spine surgery  1970's  . Abdominal hysterectomy  1980's  . Laparoscopic cholecystectomy  1995  . Plantar fascia surgery Right 2003  . Anterior cervical decomp/discectomy fusion  05-19-2008    C6 -- C7  . Shoulder arthroscopy/  debridement labrum and rotator cuff/ bursectomy/ acromioplasty/  cal release/  excision distal clavical Bilateral right 09-15-2008/   left  10-17-2008  . Cataract extraction w/ intraocular lens  implant, bilateral  2011  . Hemorrhoid surgery N/A 03/02/2014    Procedure: HEMORRHOIDOPEXY;  Surgeon: Leighton Ruff, MD;  Location: [facility];  Service: General;  Laterality: N/A;    FAMILY HISTORY: Family History  Problem Relation Age of Onset  . Hypertension Father   . Stroke Father   . Hypertension Sister   . Heart disease Brother   . Hypertension Brother   . Heart disease Brother   . Heart disease Sister   . Heart disease Sister   . Cancer Sister     breast    SOCIAL HISTORY: History   Social History  . Marital Status: Married    Spouse Name: N/A  . Number of Children: N/A  . Years of Education: N/A   Occupational History  . Not on file.   Social History Main Topics  . Smoking status: Former Smoker -- 1.50 packs/day for 30 years    Types: Cigarettes    Quit date: 02/29/1992  . Smokeless tobacco: Never Used  . Alcohol Use: No  . Drug Use: No  . Sexual Activity: Not on file   Other Topics Concern  . Not on file   Social History Narrative     PHYSICAL EXAM  Filed Vitals:   08/29/14 0923  BP: 140/81  Pulse: 71  Height: 5\' 3"  (1.6 m)  Weight: 156 lb (70.761 kg)   Body mass index is 27.64 kg/(m^2). General: well developed, well nourished elderly lady, seated, in no evident distress Head: head normocephalic and atraumatic.  Neck: supple with no carotid or supraclavicular bruits Cardiovascular: regular rate and rhythm, no  murmurs Musculoskeletal: no deformity Skin: no rash/petichiae Vascular: Normal pulses all extremities   Neurologic Exam Mental Status: Awake and fully alert. Oriented to place and time. Recent and remote memory intact. Attention span, concentration and fund of knowledge appropriate. Mood and affect appropriate.  Cranial Nerves: Fundoscopic exam reveals sharp disc margins. Pupils equal, briskly reactive to light. Extraocular movements full without nystagmus. Visual fields full to confrontation. Hearing intact. Facial sensation intact. Face, tongue, palate moves normally and symmetrically.  Motor: Normal bulk and tone. Normal strength in all tested extremity muscles. Sensory.: intact to touch ,pinprick .position and vibratory sensation. Subjective paresthesias RUE and back Coordination: Rapid alternating movements normal in all extremities. Finger-to-nose and heel-to-shin performed accurately bilaterally. Gait and Station: Arises from chair without difficulty. Stance is normal. Gait demonstrates normal stride length and balance . Able to heel, toe and tandem walk without difficulty.  Reflexes: 1+ and symmetric. Toes downgoing.  DIAGNOSTIC DATA (LABS, IMAGING, TESTING) - I reviewed patient records, labs, notes, testing and imaging myself where available.  Lab Results  Component Value Date   WBC 8.6 03/02/2014   HGB 15.3* 03/02/2014   HCT 45.0 03/02/2014   MCV 84.4 03/02/2014   PLT 213 03/02/2014      Component Value Date/Time   NA 139 03/02/2014 2110   K 4.0 03/02/2014 2110   CL 100 03/02/2014 2110   CO2 25 03/02/2014 2110   GLUCOSE 316* 03/02/2014 2110   BUN 24* 03/02/2014 2110   CREATININE 1.05 03/02/2014 2110   CALCIUM 9.9 03/02/2014 2110   GFRNONAA 53* 03/02/2014 2110   GFRAA 61* 03/02/2014 2110   Lab Results  Component Value Date   CHOL 213* 03/03/2014   HDL 72 03/03/2014   LDLCALC 129* 03/03/2014   TRIG 61 03/03/2014   CHOLHDL 3.0 03/03/2014   Lab Results   Component Value Date   HGBA1C 6.9* 03/03/2014    ASSESSMENT AND PLAN  69 y.o. 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 Vascular risk factors of Hypertension; Hyperlipidemia;  Type 2 diabetes, . The patient is a current patient of Dr. Leonie Man  who is out of the office today . This note is sent to the work in doctor.      Continue aspirin for secondary stroke prevention Strict control of blood pressure with goal 130/90 or below today's reading 140/81, patient had not taken blood pressure medicines morning Hemoglobin A1c 6.5 or less Total cholesterol less than 200  LDL cholesterol less than 80 Continue walking everyday for exercise and overall health and well-being Follow-up in 6 months Will repeat carotid Doppler at next visit Dennie Bible, [facility], [facility], [facility] [address] [place], [place] [address] [phone]

## 2014-08-30 NOTE — Progress Notes (Signed)
I have reviewed and agreed above plan. 

## 2015-03-01 ENCOUNTER — Encounter: Payer: Self-pay | Admitting: Nurse Practitioner

## 2015-03-01 ENCOUNTER — Ambulatory Visit (INDEPENDENT_AMBULATORY_CARE_PROVIDER_SITE_OTHER): Payer: PPO | Admitting: Nurse Practitioner

## 2015-03-01 VITALS — BP 147/86 | HR 78 | Ht 63.0 in | Wt 156.4 lb

## 2015-03-01 DIAGNOSIS — E119 Type 2 diabetes mellitus without complications: Secondary | ICD-10-CM

## 2015-03-01 DIAGNOSIS — I10 Essential (primary) hypertension: Secondary | ICD-10-CM

## 2015-03-01 DIAGNOSIS — E785 Hyperlipidemia, unspecified: Secondary | ICD-10-CM

## 2015-03-01 DIAGNOSIS — G459 Transient cerebral ischemic attack, unspecified: Secondary | ICD-10-CM

## 2015-03-01 NOTE — Patient Instructions (Addendum)
Continue aspirin for secondary stroke prevention Strict control of blood pressure with goal 130/90 or below today's reading 147/86,  Hemoglobin A1c 6.5 or less Total cholesterol less than 200 LDL cholesterol less than 80 Continue walking everyday for exercise and overall health and well-being Follow-up in 6 months Will repeat carotid Doppler

## 2015-03-01 NOTE — Progress Notes (Addendum)
[facility]  PATIENT: [name] DOB: Nov 21, 1945   REASON FOR VISIT: Follow-up for stroke, essential hypertension, diabetes and hyperlipidemia HISTORY FROM: Patient    HISTORY OF PRESENT ILLNESS:HISTORY: 34 year lady seen today for the first following recent hospital admission for TIA. In November 2015 She states she had another episode a few weeks ago when she got up in the morning she had a bad headache with right leg numbness followed by right arm numbness lasting less than 10 minutes. She did not seek medical help at that time. 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 MRA showed no nodules and intracranial stenosis or occlusion. LDL cholesterol is elevated at 129 but patient had history of statin intolerance and was continued on Zetia. Blood pressure is inadequately controlled. She was previously on aspirin 81 the dose was increased to 325 which she is tolerating well without any side effects. Now she noticed that the numbness in the right leg has improved but she still has some residual numbness in the right upper extremity and back of her chest. She is independent in activities of daily living. She had lipid profile checked recently by her primary physician but I do not have those results.  UPDATE 08/29/2014 [name], 69 year old female returns for follow-up. She was last seen in this office 05/30/2014 by Dr. Leonie Man. 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 She is currently on aspirin 325 daily without further stroke or TIA symptoms since last seen. Her most recent hemoglobin A1c 6.9. Lipid profile with cholesterol 213 and LDL 129. She also has a history of hypertension and her reading is 140/81 in the office today however she did not take her blood pressure medicines morning she also has hyperlipidemia. She tells me she is walking 3  miles 4-5 times a week and she was congratulated on this. Unfortunately she has Gained Back weight that she lost. She returns for reevaluation. She continues to complain of numbness in the right upper extremity which is residual from her previous stroke. UPDATE 03/01/2015 [name], 69 year old female returns for follow-up. 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 She remains on aspirin 325 daily without further stroke or TIA symptoms. Her routine labs are followed by her primary care. She claims her CBG runs between 130 and 154. She has a history of hypertension and her reading today is elevated in the office 147/86. She continues to walk 3-4 miles several times a week. She continues to complain of numbness in the right upper extremity which is the residual from her previous stroke. She returns for reevaluation. She is due for a carotid Doppler.   REVIEW OF SYSTEMS: Full 14 system review of systems performed and notable only for those listed, all others are neg:  Constitutional: neg  Cardiovascular: neg Ear/Nose/Throat: neg  Skin: neg Eyes: neg Respiratory: neg Gastroitestinal: neg  Hematology/Lymphatic: neg  Endocrine: neg Musculoskeletal:neg Allergy/Immunology: neg Neurological: neg Psychiatric: neg Sleep : neg   ALLERGIES: Allergies  Allergen Reactions  . Vicodin [Hydrocodone-Acetaminophen] Swelling  . Crestor [Rosuvastatin Calcium] Other (See Comments)    MYALGIA  . Lipitor [Atorvastatin Calcium] Other (See Comments)    MYALGIA    HOME MEDICATIONS: Outpatient Prescriptions Prior to Visit  Medication Sig Dispense Refill  . aspirin 325 MG EC tablet Take 1  tablet (325 mg total) by mouth every morning. 30 tablet 0  . Cholecalciferol (VITAMIN D3) 50000 UNITS CAPS Take 1 capsule by mouth once a week.    . diltiazem (DILT-XR) 240 MG 24 hr capsule Take 240 mg by mouth every morning.    . ezetimibe (ZETIA) 10 MG  tablet Take 10 mg by mouth every morning.    . fluticasone (FLONASE) 50 MCG/ACT nasal spray Place 1 spray into both nostrils as needed.  0  . lisinopril-hydrochlorothiazide (PRINZIDE,ZESTORETIC) 20-12.5 MG per tablet Take 1 tablet by mouth every morning.    Marland Kitchen LORazepam (ATIVAN) 0.5 MG tablet Take 5 mg by mouth as needed.  0  . simvastatin (ZOCOR) 20 MG tablet Take 1 tablet (20 mg total) by mouth daily at 6 PM. 30 tablet 0   No facility-administered medications prior to visit.    PAST MEDICAL HISTORY: Past Medical History  Diagnosis Date  . Hypertension   . Hyperlipidemia   . Vitamin D deficiency   . Type 2 diabetes, diet controlled ([place])   . Bleeding internal hemorrhoids   . Wears glasses     PAST SURGICAL HISTORY: Past Surgical History  Procedure Laterality Date  . Lumbar spine surgery  1970's  . Abdominal hysterectomy  1980's  . Laparoscopic cholecystectomy  1995  . Plantar fascia surgery Right 2003  . Anterior cervical decomp/discectomy fusion  05-19-2008    C6 -- C7  . Shoulder arthroscopy/  debridement labrum and rotator cuff/ bursectomy/ acromioplasty/  cal release/  excision distal clavical Bilateral right 09-15-2008/   left  10-17-2008  . Cataract extraction w/ intraocular lens  implant, bilateral  2011  . Hemorrhoid surgery N/A 03/02/2014    Procedure: HEMORRHOIDOPEXY;  Surgeon: Leighton Ruff, MD;  Location: [facility];  Service: General;  Laterality: N/A;    FAMILY HISTORY: Family History  Problem Relation Age of Onset  . Hypertension Father   . Stroke Father   . Hypertension Sister   . Heart disease Brother   . Hypertension Brother   . Heart disease Brother   . Heart disease Sister   . Heart disease Sister   . Cancer Sister     breast    SOCIAL HISTORY: Social History   Social History  . Marital Status: Married    Spouse Name: N/A  . Number of Children: N/A  . Years of Education: N/A   Occupational History  . Not on file.    Social History Main Topics  . Smoking status: Former Smoker -- 1.50 packs/day for 30 years    Types: Cigarettes    Quit date: 02/29/1992  . Smokeless tobacco: Never Used  . Alcohol Use: No  . Drug Use: No  . Sexual Activity: Not on file   Other Topics Concern  . Not on file   Social History Narrative     PHYSICAL EXAM  Filed Vitals:   03/01/15 0829  BP: 147/86  Pulse: 78  Height: 5\' 3"  (1.6 m)  Weight: 156 lb 6.4 oz (70.943 kg)   Body mass index is 27.71 kg/(m^2). General: well developed, well nourished elderly lady, seated, in no evident distress Head: head normocephalic and atraumatic.  Neck: supple with no carotid or supraclavicular bruits Cardiovascular: regular rate and rhythm, no murmurs Musculoskeletal: no deformity Skin: no rash/petichiae Vascular: Normal pulses all extremities  Neurologic Exam Mental Status: Awake and fully alert. Oriented to place and time. Recent and remote memory intact. Attention span, concentration and fund of knowledge appropriate.  Mood and affect appropriate.  Cranial Nerves: Fundoscopic exam not done. Pupils equal, briskly reactive to light. Extraocular movements full without nystagmus. Visual fields full to confrontation. Hearing intact. Facial sensation intact. Face, tongue, palate moves normally and symmetrically.  Motor: Normal bulk and tone. Normal strength in all tested extremity muscles. Sensory.: intact to touch ,pinprick .position and vibratory sensation. Subjective paresthesias RUE and back Coordination: Rapid alternating movements normal in all extremities. Finger-to-nose and heel-to-shin performed accurately bilaterally. Gait and Station: Arises from chair without difficulty. Stance is normal. Gait demonstrates normal stride length and balance . Able to heel, toe and tandem walk without difficulty.  Reflexes: 1+ and symmetric. Toes downgoing.  DIAGNOSTIC DATA (LABS, IMAGING, TESTING) - I reviewed patient records,  labs, notes, testing and imaging myself where available.  Lab Results  Component Value Date   WBC 8.6 03/02/2014   HGB 15.3* 03/02/2014   HCT 45.0 03/02/2014   MCV 84.4 03/02/2014   PLT 213 03/02/2014      Component Value Date/Time   NA 139 03/02/2014 2110   K 4.0 03/02/2014 2110   CL 100 03/02/2014 2110   CO2 25 03/02/2014 2110   GLUCOSE 316* 03/02/2014 2110   BUN 24* 03/02/2014 2110   CREATININE 1.05 03/02/2014 2110   CALCIUM 9.9 03/02/2014 2110   GFRNONAA 53* 03/02/2014 2110   GFRAA 61* 03/02/2014 2110   Lab Results  Component Value Date   CHOL 213* 03/03/2014   HDL 72 03/03/2014   LDLCALC 129* 03/03/2014   TRIG 61 03/03/2014   CHOLHDL 3.0 03/03/2014   Lab Results  Component Value Date   HGBA1C 6.9* 03/03/2014   ASSESSMENT AND PLAN 69 y.o. 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 Vascular risk factors of Hypertension; Hyperlipidemia; Type 2 diabetes, . The patient is a current patient of Dr. Leonie Man who is out of the office today . This note is sent to the work in doctor.   Control of risk factors is essential to prevent recurrent stroke Continue aspirin for secondary stroke prevention Strict control of blood pressure with goal 130/90 or below today's reading 147/86,  Hemoglobin A1c 6.5 or less, last 6.9 Total cholesterol less than 200, last 213 LDL cholesterol less than 80, last 129 Continue walking everyday for exercise and overall health and well-being Healthy diet and increased fiber Follow-up in 6 months Will repeat carotid Doppler to monitor for increased carotid disease Vst time 25 min Dennie Bible, [facility], [facility], APRN  [facility] [address] [place], [place] [address] [phone]  Personally examined patient and images, and have participated in and made any corrections needed to history,  physical, neuro exam,assessment and plan as stated above.  Sarina Ill, MD [facility]

## 2015-03-06 ENCOUNTER — Encounter: Payer: Self-pay | Admitting: Occupational Therapy

## 2015-03-06 NOTE — Therapy (Signed)
[facility] [address] [place], [place], [address] Phone: [phone]   Fax:  [phone]  Patient Details  Name: [name] MRN: [id number] Date of Birth: [birth year] Referring Provider:  No ref. provider found  Encounter Date: 03/06/2015   OCCUPATIONAL THERAPY DISCHARGE SUMMARY  Visits from Start of Care: 2  Current functional level related to goals / functional outcomes:         OT Long Term Goals - 03/15/14 1511    OT LONG TERM GOAL #1   Title  I with HEP   (check 04/13/14)   Time 4   Period Weeks   Status Partially met.  Instructed and pt returned demo, but did not come back for review   OT LONG TERM GOAL #2   Title Pt will report that she has resumed prior level of home management, and cooking at a modified independent level demonstrating good safety awareness.(check 04/13/14)   Time 4   Period Weeks   Status Unable to reassess   OT LONG TERM GOAL #3   Title Pt will resume use of RUE as her dominant hand at least 90% of the time with painless than or equal to 2/10. (check 04/13/14)   Time 4   Period Weeks   Status unable to reassess       Remaining deficits: decr coordination, decr strength, decr proprioception affecting RUE functional use    Education / Equipment: Pt instructed in HEP and compensation strategies for incr safety due to sensory deficits.  Pt education not completed due to pt not returning to OT after 2nd visit.   Plan: Patient agrees to discharge.  Patient goals were not met. Patient is being discharged due to not returning since the last visit. Pt did not return after 2nd visit to reassess goals.  ?????         [facility] [address] [place], [place], [address] Phone: [phone]   Fax:  [place], OTR/L 03/06/2015 1:03 PM

## 2015-03-09 ENCOUNTER — Ambulatory Visit (INDEPENDENT_AMBULATORY_CARE_PROVIDER_SITE_OTHER): Payer: PPO

## 2015-03-09 DIAGNOSIS — G459 Transient cerebral ischemic attack, unspecified: Secondary | ICD-10-CM | POA: Diagnosis not present

## 2015-03-13 ENCOUNTER — Telehealth: Payer: Self-pay | Admitting: *Deleted

## 2015-03-13 NOTE — Telephone Encounter (Signed)
I called and spoke to pt and let her know that carotid doppler results were normal.   She verbalized understanding.

## 2015-06-14 DIAGNOSIS — Z0000 Encounter for general adult medical examination without abnormal findings: Secondary | ICD-10-CM | POA: Diagnosis not present

## 2015-06-14 DIAGNOSIS — G4700 Insomnia, unspecified: Secondary | ICD-10-CM | POA: Diagnosis not present

## 2015-06-14 DIAGNOSIS — Z7984 Long term (current) use of oral hypoglycemic drugs: Secondary | ICD-10-CM | POA: Diagnosis not present

## 2015-06-14 DIAGNOSIS — E11319 Type 2 diabetes mellitus with unspecified diabetic retinopathy without macular edema: Secondary | ICD-10-CM | POA: Diagnosis not present

## 2015-06-14 DIAGNOSIS — I129 Hypertensive chronic kidney disease with stage 1 through stage 4 chronic kidney disease, or unspecified chronic kidney disease: Secondary | ICD-10-CM | POA: Diagnosis not present

## 2015-06-14 DIAGNOSIS — J309 Allergic rhinitis, unspecified: Secondary | ICD-10-CM | POA: Diagnosis not present

## 2015-06-14 DIAGNOSIS — N183 Chronic kidney disease, stage 3 (moderate): Secondary | ICD-10-CM | POA: Diagnosis not present

## 2015-06-14 DIAGNOSIS — E785 Hyperlipidemia, unspecified: Secondary | ICD-10-CM | POA: Diagnosis not present

## 2015-06-14 DIAGNOSIS — E1121 Type 2 diabetes mellitus with diabetic nephropathy: Secondary | ICD-10-CM | POA: Diagnosis not present

## 2015-06-14 DIAGNOSIS — E559 Vitamin D deficiency, unspecified: Secondary | ICD-10-CM | POA: Diagnosis not present

## 2015-06-21 ENCOUNTER — Encounter (HOSPITAL_COMMUNITY): Payer: Self-pay | Admitting: Emergency Medicine

## 2015-06-21 ENCOUNTER — Observation Stay (HOSPITAL_COMMUNITY)
Admission: EM | Admit: 2015-06-21 | Discharge: 2015-06-23 | Disposition: A | Discharge status: Home / Self Care | Payer: PPO | Attending: Family Medicine | Admitting: Family Medicine

## 2015-06-21 ENCOUNTER — Emergency Department (HOSPITAL_COMMUNITY): Payer: PPO

## 2015-06-21 DIAGNOSIS — Z7984 Long term (current) use of oral hypoglycemic drugs: Secondary | ICD-10-CM | POA: Insufficient documentation

## 2015-06-21 DIAGNOSIS — E559 Vitamin D deficiency, unspecified: Secondary | ICD-10-CM | POA: Insufficient documentation

## 2015-06-21 DIAGNOSIS — I639 Cerebral infarction, unspecified: Secondary | ICD-10-CM | POA: Diagnosis present

## 2015-06-21 DIAGNOSIS — Z7982 Long term (current) use of aspirin: Secondary | ICD-10-CM | POA: Insufficient documentation

## 2015-06-21 DIAGNOSIS — I10 Essential (primary) hypertension: Secondary | ICD-10-CM | POA: Diagnosis present

## 2015-06-21 DIAGNOSIS — E119 Type 2 diabetes mellitus without complications: Secondary | ICD-10-CM | POA: Insufficient documentation

## 2015-06-21 DIAGNOSIS — E785 Hyperlipidemia, unspecified: Secondary | ICD-10-CM | POA: Insufficient documentation

## 2015-06-21 DIAGNOSIS — G459 Transient cerebral ischemic attack, unspecified: Secondary | ICD-10-CM | POA: Diagnosis not present

## 2015-06-21 DIAGNOSIS — R200 Anesthesia of skin: Secondary | ICD-10-CM | POA: Diagnosis not present

## 2015-06-21 DIAGNOSIS — Z87891 Personal history of nicotine dependence: Secondary | ICD-10-CM | POA: Insufficient documentation

## 2015-06-21 DIAGNOSIS — Z8673 Personal history of transient ischemic attack (TIA), and cerebral infarction without residual deficits: Secondary | ICD-10-CM

## 2015-06-21 DIAGNOSIS — E876 Hypokalemia: Secondary | ICD-10-CM | POA: Diagnosis not present

## 2015-06-21 DIAGNOSIS — E1169 Type 2 diabetes mellitus with other specified complication: Secondary | ICD-10-CM

## 2015-06-21 DIAGNOSIS — Z79899 Other long term (current) drug therapy: Secondary | ICD-10-CM | POA: Insufficient documentation

## 2015-06-21 LAB — DIFFERENTIAL
Basophils Absolute: 0 10*3/uL (ref 0.0–0.1)
Basophils Relative: 0 %
EOS PCT: 3 %
Eosinophils Absolute: 0.3 10*3/uL (ref 0.0–0.7)
LYMPHS ABS: 3.1 10*3/uL (ref 0.7–4.0)
LYMPHS PCT: 33 %
MONO ABS: 0.8 10*3/uL (ref 0.1–1.0)
Monocytes Relative: 8 %
Neutro Abs: 5.4 10*3/uL (ref 1.7–7.7)
Neutrophils Relative %: 56 %

## 2015-06-21 LAB — COMPREHENSIVE METABOLIC PANEL
ALK PHOS: 72 U/L (ref 38–126)
ALT: 22 U/L (ref 14–54)
ANION GAP: 14 (ref 5–15)
AST: 22 U/L (ref 15–41)
Albumin: 4 g/dL (ref 3.5–5.0)
BILIRUBIN TOTAL: 0.2 mg/dL — AB (ref 0.3–1.2)
BUN: 23 mg/dL — ABNORMAL HIGH (ref 6–20)
CALCIUM: 9.7 mg/dL (ref 8.9–10.3)
CO2: 29 mmol/L (ref 22–32)
Chloride: 99 mmol/L — ABNORMAL LOW (ref 101–111)
Creatinine, Ser: 0.99 mg/dL (ref 0.44–1.00)
GFR calc non Af Amer: 56 mL/min — ABNORMAL LOW (ref >60)
Glucose, Bld: 154 mg/dL — ABNORMAL HIGH (ref 65–99)
Potassium: 3.4 mmol/L — ABNORMAL LOW (ref 3.5–5.1)
SODIUM: 142 mmol/L (ref 135–145)
TOTAL PROTEIN: 6.9 g/dL (ref 6.5–8.1)

## 2015-06-21 LAB — I-STAT CHEM 8, ED
BUN: 27 mg/dL — ABNORMAL HIGH (ref 6–20)
CALCIUM ION: 1.15 mmol/L (ref 1.13–1.30)
Chloride: 98 mmol/L — ABNORMAL LOW (ref 101–111)
Creatinine, Ser: 1 mg/dL (ref 0.44–1.00)
GLUCOSE: 144 mg/dL — AB (ref 65–99)
HCT: 47 % — ABNORMAL HIGH (ref 36.0–46.0)
HEMOGLOBIN: 16 g/dL — AB (ref 12.0–15.0)
Potassium: 3.3 mmol/L — ABNORMAL LOW (ref 3.5–5.1)
Sodium: 143 mmol/L (ref 135–145)
TCO2: 31 mmol/L (ref 0–100)

## 2015-06-21 LAB — CBC
HCT: 45.7 % (ref 36.0–46.0)
Hemoglobin: 15.3 g/dL — ABNORMAL HIGH (ref 12.0–15.0)
MCH: 28.4 pg (ref 26.0–34.0)
MCHC: 33.5 g/dL (ref 30.0–36.0)
MCV: 84.8 fL (ref 78.0–100.0)
PLATELETS: 281 10*3/uL (ref 150–400)
RBC: 5.39 MIL/uL — AB (ref 3.87–5.11)
RDW: 12.9 % (ref 11.5–15.5)
WBC: 9.6 10*3/uL (ref 4.0–10.5)

## 2015-06-21 LAB — I-STAT TROPONIN, ED: TROPONIN I, POC: 0 ng/mL (ref 0.00–0.08)

## 2015-06-21 LAB — PROTIME-INR
INR: 0.9 (ref 0.00–1.49)
PROTHROMBIN TIME: 12.4 s (ref 11.6–15.2)

## 2015-06-21 LAB — CBG MONITORING, ED: GLUCOSE-CAPILLARY: 166 mg/dL — AB (ref 65–99)

## 2015-06-21 LAB — APTT: aPTT: 27 seconds (ref 24–37)

## 2015-06-21 NOTE — ED Notes (Signed)
Pt taken to MRI will repeat neuro check on arrival back to room.

## 2015-06-21 NOTE — Consult Note (Signed)
Admission H&P    Chief Complaint: New onset right-sided numbness.  HPI: [name] is an 70 y.o. female history of hypertension, hyperlipidemia, diabetes mellitus and previous stroke and November 2015, presenting with recurrent acute numbness involving right extremities. Onset was at 9:15 PM tonight. He did not develop numbness involving her face and has not noticed speech changes. She's been taking aspirin 325 mg per day. CT scan of her head showed no acute changes. Old infarction involving posterior limb of left internal capsule was noted. NIH stroke score was 1 for numbness. Patient is being admitted for probable acute recurrent left subcortical stroke.  LSN: 9:15 PM on 06/21/2015 tPA Given: No: Minimal deficits mRankin:  Past Medical History  Diagnosis Date  . Hypertension   . Hyperlipidemia   . Vitamin D deficiency   . Type 2 diabetes, diet controlled ([place])   . Bleeding internal hemorrhoids   . Wears glasses     Past Surgical History  Procedure Laterality Date  . Lumbar spine surgery  1970's  . Abdominal hysterectomy  1980's  . Laparoscopic cholecystectomy  1995  . Plantar fascia surgery Right 2003  . Anterior cervical decomp/discectomy fusion  05-19-2008    C6 -- C7  . Shoulder arthroscopy/  debridement labrum and rotator cuff/ bursectomy/ acromioplasty/  cal release/  excision distal clavical Bilateral right 09-15-2008/   left  10-17-2008  . Cataract extraction w/ intraocular lens  implant, bilateral  2011  . Hemorrhoid surgery N/A 03/02/2014    Procedure: HEMORRHOIDOPEXY;  Surgeon: Leighton Ruff, MD;  Location: [facility];  Service: General;  Laterality: N/A;    Family History  Problem Relation Age of Onset  . Hypertension Father   . Stroke Father   . Hypertension Sister   . Heart disease Brother   . Hypertension Brother   . Heart disease Brother   . Heart disease Sister   . Heart disease Sister   . Cancer Sister     breast   Social History:   reports that she quit smoking about 23 years ago. Her smoking use included Cigarettes. She has a 45 pack-year smoking history. She has never used smokeless tobacco. She reports that she does not drink alcohol or use illicit drugs.  Allergies:  Allergies  Allergen Reactions  . Vicodin [Hydrocodone-Acetaminophen] Swelling  . Crestor [Rosuvastatin Calcium] Other (See Comments)    MYALGIA  . Lipitor [Atorvastatin Calcium] Other (See Comments)    MYALGIA   Medications: Patient's preadmission medications were reviewed by me.  ROS: History obtained from the patient  General ROS: negative for - chills, fatigue, fever, night sweats, weight gain or weight loss Psychological ROS: negative for - behavioral disorder, hallucinations, memory difficulties, mood swings or suicidal ideation Ophthalmic ROS: negative for - blurry vision, double vision, eye pain or loss of vision ENT ROS: negative for - epistaxis, nasal discharge, oral lesions, sore throat, tinnitus or vertigo Allergy and Immunology ROS: negative for - hives or itchy/watery eyes Hematological and Lymphatic ROS: negative for - bleeding problems, bruising or swollen lymph nodes Endocrine ROS: negative for - galactorrhea, hair pattern changes, polydipsia/polyuria or temperature intolerance Respiratory ROS: negative for - cough, hemoptysis, shortness of breath or wheezing Cardiovascular ROS: negative for - chest pain, dyspnea on exertion, edema or irregular heartbeat Gastrointestinal ROS: negative for - abdominal pain, diarrhea, hematemesis, nausea/vomiting or stool incontinence Genito-Urinary ROS: negative for - dysuria, hematuria, incontinence or urinary frequency/urgency Musculoskeletal ROS: negative for - joint swelling or muscular weakness Neurological ROS:  as noted in HPI Dermatological ROS: negative for rash and skin lesion changes  Physical Examination: Blood pressure 146/102, pulse 91, temperature 97.5 F (36.4 C), temperature  source Oral, resp. rate 18, SpO2 96 %.  HEENT-  Normocephalic, no lesions, without obvious abnormality.  Normal external eye and conjunctiva.  Normal TM's bilaterally.  Normal auditory canals and external ears. Normal external nose, mucus membranes and septum.  Normal pharynx. Neck supple with no masses, nodes, nodules or enlargement. Cardiovascular - regular rate and rhythm, S1, S2 normal, no murmur, click, rub or gallop Lungs - chest clear, no wheezing, rales, normal symmetric air entry Abdomen - soft, non-tender; bowel sounds normal; no masses,  no organomegaly Extremities - no joint deformities, effusion, or inflammation and no edema  Neurologic Examination: Mental Status: Alert, oriented, thought content appropriate.  Speech fluent without evidence of aphasia. Able to follow commands without difficulty. Cranial Nerves: II-Visual fields were normal. III/IV/VI-Pupils were equal and reacted normally to light. Extraocular movements were full and conjugate.    V/VII-no facial numbness and no facial weakness. VIII-normal. X-normal speech and symmetrical palatal movement. XI: trapezius strength/neck flexion strength normal bilaterally XII-midline tongue extension with normal strength. Motor: 5/5 bilaterally with normal tone and bulk Sensory: Reduced perception of tactile sensation over right extremities compared to left extremities. Deep Tendon Reflexes: 2+ and symmetric. Plantars: Mute bilaterally Cerebellar: Normal finger-to-nose testing. Carotid auscultation: Normal  Results for orders placed or performed during the hospital encounter of 06/21/15 (from the past 48 hour(s))  CBC     Status: Abnormal   Collection Time: 06/21/15 10:00 PM  Result Value Ref Range   WBC 9.6 4.0 - 10.5 K/uL   RBC 5.39 (H) 3.87 - 5.11 MIL/uL   Hemoglobin 15.3 (H) 12.0 - 15.0 g/dL   HCT 45.7 36.0 - 46.0 %   MCV 84.8 78.0 - 100.0 fL   MCH 28.4 26.0 - 34.0 pg   MCHC 33.5 30.0 - 36.0 g/dL   RDW 12.9 11.5 -  15.5 %   Platelets 281 150 - 400 K/uL  Differential     Status: None   Collection Time: 06/21/15 10:00 PM  Result Value Ref Range   Neutrophils Relative % 56 %   Neutro Abs 5.4 1.7 - 7.7 K/uL   Lymphocytes Relative 33 %   Lymphs Abs 3.1 0.7 - 4.0 K/uL   Monocytes Relative 8 %   Monocytes Absolute 0.8 0.1 - 1.0 K/uL   Eosinophils Relative 3 %   Eosinophils Absolute 0.3 0.0 - 0.7 K/uL   Basophils Relative 0 %   Basophils Absolute 0.0 0.0 - 0.1 K/uL  CBG monitoring, ED     Status: Abnormal   Collection Time: 06/21/15 10:11 PM  Result Value Ref Range   Glucose-Capillary 166 (H) 65 - 99 mg/dL  I-Stat Chem 8, ED  (not at [facility], [facility])     Status: Abnormal   Collection Time: 06/21/15 10:12 PM  Result Value Ref Range   Sodium 143 135 - 145 mmol/L   Potassium 3.3 (L) 3.5 - 5.1 mmol/L   Chloride 98 (L) 101 - 111 mmol/L   BUN 27 (H) 6 - 20 mg/dL   Creatinine, Ser 1.00 0.44 - 1.00 mg/dL   Glucose, Bld 144 (H) 65 - 99 mg/dL   Calcium, Ion 1.15 1.13 - 1.30 mmol/L   TCO2 31 0 - 100 mmol/L   Hemoglobin 16.0 (H) 12.0 - 15.0 g/dL   HCT 47.0 (H) 36.0 - 46.0 %   No  results found.  Assessment: 70 y.o. female with multiple risk factors for stroke as well as previous left internal capsular infarction, presenting with probable recurrent acute left subcortical ischemic infarction.  Stroke Risk Factors - diabetes mellitus, family history, hyperlipidemia and hypertension  Plan: 1. HgbA1c, fasting lipid panel 2. MRI, MRA  of the brain without contrast 3. PT consult, OT consult, Speech consult 4. Echocardiogram 5. Carotid dopplers 6. Prophylactic therapy-Antiplatelet med: Aspirin 325 mg per day 7. Risk factor modification 8. Telemetry monitoring  C.R. Nicole Kindred, MD [facility] [phone]  06/21/2015, 10:19 PM

## 2015-06-21 NOTE — ED Notes (Signed)
Contacted [facility] to page Code Stroke

## 2015-06-21 NOTE — ED Notes (Addendum)
Pt. reports sudden onset  right arm numbness " it feels different" onset 9:15 pm this evening , history of CVA , speech clear , no facial asymmetry , equal grips with no arm drift , seen by K .Threasa Alpha PA at triage advised RN to call code stroke , transported to [place] scan / charge nurse notified.

## 2015-06-21 NOTE — Progress Notes (Signed)
Code Stroke called on 70 y.o female. While at home watching television Patient had acute onset numbness in right arm and trunk. Pertinent history includes some residual right sided numbness from stroke in November 2015, HTN, Hyperlipidemia, DM. She also expresses recent increased stress from caring for her ill sister reently placed in a SNF. Pt brought to [facility] per husband and taken to CT scan STAT after evaluation per EDP. CT negative for acute changes per Neurologist Dr. Nicole Kindred. NIHSS completed yielding 1 for mild sensory deficit in right arm and leg. Pt for admit for full stroke work up. For MRI tonight and will remain within TPA window until 0130.

## 2015-06-22 ENCOUNTER — Observation Stay (HOSPITAL_COMMUNITY): Payer: PPO

## 2015-06-22 ENCOUNTER — Other Ambulatory Visit (HOSPITAL_COMMUNITY): Payer: PPO

## 2015-06-22 ENCOUNTER — Encounter (HOSPITAL_COMMUNITY): Payer: Self-pay | Admitting: Internal Medicine

## 2015-06-22 DIAGNOSIS — E876 Hypokalemia: Secondary | ICD-10-CM | POA: Diagnosis present

## 2015-06-22 DIAGNOSIS — I6502 Occlusion and stenosis of left vertebral artery: Secondary | ICD-10-CM | POA: Diagnosis not present

## 2015-06-22 DIAGNOSIS — G458 Other transient cerebral ischemic attacks and related syndromes: Secondary | ICD-10-CM

## 2015-06-22 DIAGNOSIS — R200 Anesthesia of skin: Secondary | ICD-10-CM | POA: Insufficient documentation

## 2015-06-22 DIAGNOSIS — M47812 Spondylosis without myelopathy or radiculopathy, cervical region: Secondary | ICD-10-CM | POA: Diagnosis not present

## 2015-06-22 DIAGNOSIS — I6602 Occlusion and stenosis of left middle cerebral artery: Secondary | ICD-10-CM | POA: Diagnosis not present

## 2015-06-22 DIAGNOSIS — I639 Cerebral infarction, unspecified: Secondary | ICD-10-CM | POA: Diagnosis present

## 2015-06-22 DIAGNOSIS — E119 Type 2 diabetes mellitus without complications: Secondary | ICD-10-CM | POA: Diagnosis not present

## 2015-06-22 DIAGNOSIS — I10 Essential (primary) hypertension: Secondary | ICD-10-CM

## 2015-06-22 DIAGNOSIS — G459 Transient cerebral ischemic attack, unspecified: Secondary | ICD-10-CM | POA: Diagnosis not present

## 2015-06-22 DIAGNOSIS — I6330 Cerebral infarction due to thrombosis of unspecified cerebral artery: Secondary | ICD-10-CM | POA: Diagnosis not present

## 2015-06-22 DIAGNOSIS — I6622 Occlusion and stenosis of left posterior cerebral artery: Secondary | ICD-10-CM | POA: Diagnosis not present

## 2015-06-22 LAB — URINE MICROSCOPIC-ADD ON

## 2015-06-22 LAB — GLUCOSE, CAPILLARY
GLUCOSE-CAPILLARY: 156 mg/dL — AB (ref 65–99)
GLUCOSE-CAPILLARY: 117 mg/dL — AB (ref 65–99)
GLUCOSE-CAPILLARY: 102 mg/dL — AB (ref 65–99)
Glucose-Capillary: 132 mg/dL — ABNORMAL HIGH (ref 65–99)
Glucose-Capillary: 180 mg/dL — ABNORMAL HIGH (ref 65–99)

## 2015-06-22 LAB — URINALYSIS, ROUTINE W REFLEX MICROSCOPIC
Bilirubin Urine: NEGATIVE
Glucose, UA: NEGATIVE mg/dL
HGB URINE DIPSTICK: NEGATIVE
Ketones, ur: NEGATIVE mg/dL
Nitrite: NEGATIVE
PROTEIN: NEGATIVE mg/dL
SPECIFIC GRAVITY, URINE: 1.009 (ref 1.005–1.030)
pH: 6 (ref 5.0–8.0)

## 2015-06-22 LAB — LIPID PANEL
CHOL/HDL RATIO: 3.2 ratio
Cholesterol: 134 mg/dL (ref 0–200)
HDL: 42 mg/dL (ref >40)
LDL Cholesterol: 81 mg/dL (ref 0–99)
Triglycerides: 57 mg/dL (ref <150)
VLDL: 11 mg/dL (ref 0–40)

## 2015-06-22 LAB — MRSA PCR SCREENING: MRSA BY PCR: NEGATIVE

## 2015-06-22 LAB — TROPONIN I

## 2015-06-22 MED ORDER — ASPIRIN EC 325 MG PO TBEC
325.0000 mg | DELAYED_RELEASE_TABLET | Freq: Every morning | ORAL | Status: DC
  Administered 2015-06-22: 325 mg via ORAL
  Filled 2015-06-22: qty 1

## 2015-06-22 MED ORDER — SIMVASTATIN 20 MG PO TABS
20.0000 mg | ORAL_TABLET | Freq: Every day | ORAL | Status: DC
  Filled 2015-06-22: qty 1

## 2015-06-22 MED ORDER — INSULIN ASPART 100 UNIT/ML ~~LOC~~ SOLN
0.0000 [IU] | SUBCUTANEOUS | Status: DC
  Administered 2015-06-22: 2 [IU] via SUBCUTANEOUS
  Administered 2015-06-22: 100 [IU] via SUBCUTANEOUS
  Administered 2015-06-22: 2 [IU] via SUBCUTANEOUS
  Administered 2015-06-23: 1 [IU] via SUBCUTANEOUS

## 2015-06-22 MED ORDER — STROKE: EARLY STAGES OF RECOVERY BOOK
Freq: Once | Status: AC
  Administered 2015-06-22: 1

## 2015-06-22 MED ORDER — FLUTICASONE PROPIONATE 50 MCG/ACT NA SUSP: 1.0000 | Freq: Every day | NASAL | Status: DC | PRN

## 2015-06-22 MED ORDER — CYCLOBENZAPRINE HCL 10 MG PO TABS
10.0000 mg | ORAL_TABLET | Freq: Three times a day (TID) | ORAL | Status: DC
  Administered 2015-06-22 – 2015-06-23 (×4): 10 mg via ORAL
  Filled 2015-06-22 (×4): qty 1

## 2015-06-22 MED ORDER — SODIUM CHLORIDE 0.9 % IV SOLN
INTRAVENOUS | Status: DC
  Administered 2015-06-22: 75 mL/h via INTRAVENOUS

## 2015-06-22 MED ORDER — DILTIAZEM HCL ER COATED BEADS 240 MG PO CP24
240.0000 mg | ORAL_CAPSULE | Freq: Every morning | ORAL | Status: DC
  Administered 2015-06-22 – 2015-06-23 (×2): 240 mg via ORAL
  Filled 2015-06-22 (×2): qty 1

## 2015-06-22 MED ORDER — SENNOSIDES-DOCUSATE SODIUM 8.6-50 MG PO TABS: 1.0000 | ORAL_TABLET | Freq: Every evening | ORAL | Status: DC | PRN

## 2015-06-22 MED ORDER — ACETAMINOPHEN 325 MG PO TABS
650.0000 mg | ORAL_TABLET | Freq: Four times a day (QID) | ORAL | Status: DC | PRN
  Administered 2015-06-22 (×2): 650 mg via ORAL
  Filled 2015-06-22 (×2): qty 2

## 2015-06-22 MED ORDER — EZETIMIBE 10 MG PO TABS
10.0000 mg | ORAL_TABLET | Freq: Every morning | ORAL | Status: DC
  Administered 2015-06-22 – 2015-06-23 (×2): 10 mg via ORAL
  Filled 2015-06-22 (×2): qty 1

## 2015-06-22 MED ORDER — ENOXAPARIN SODIUM 40 MG/0.4ML ~~LOC~~ SOLN
40.0000 mg | SUBCUTANEOUS | Status: DC
  Administered 2015-06-22: 40 mg via SUBCUTANEOUS
  Filled 2015-06-22: qty 0.4

## 2015-06-22 MED ORDER — POTASSIUM CHLORIDE CRYS ER 20 MEQ PO TBCR
40.0000 meq | EXTENDED_RELEASE_TABLET | Freq: Once | ORAL | Status: AC
  Administered 2015-06-22: 40 meq via ORAL
  Filled 2015-06-22: qty 2

## 2015-06-22 MED ORDER — CLOPIDOGREL BISULFATE 75 MG PO TABS
75.0000 mg | ORAL_TABLET | Freq: Every day | ORAL | Status: DC
  Administered 2015-06-23: 75 mg via ORAL
  Filled 2015-06-22: qty 1

## 2015-06-22 NOTE — Care Management Note (Signed)
Case Management Note  Patient Details  Name: [name] MRN: [id number] Date of Birth: 1946-04-05  Subjective/Objective:                    Action/Plan: Patient presented with right-sided numbness. Lives at home with spouse. Will follow for discharge needs pending PT/OT evals and physician orders.  Expected Discharge Date:                  Expected Discharge Plan:     In-House Referral:     Discharge planning Services     Post Acute Care Choice:    Choice offered to:     DME Arranged:    DME Agency:     HH Arranged:    HH Agency:     Status of Service:  In process, will continue to follow  Medicare Important Message Given:    Date Medicare IM Given:    Medicare IM give by:    Date Additional Medicare IM Given:    Additional Medicare Important Message give by:     If discussed at [place] of Stay Meetings, dates discussed:    Additional Comments:  Rolm Baptise, RN 06/22/2015, 11:31 AM [phone]

## 2015-06-22 NOTE — Progress Notes (Signed)
  Echocardiogram 2D Echocardiogram has been performed.  Donata Clay 06/22/2015, 1:27 PM

## 2015-06-22 NOTE — Progress Notes (Signed)
[facility] PROGRESS NOTE  [name] [id number] DOB: 18-Jan-1946 DOA: 06/21/2015 PCP: Vidal Schwalbe, MD  Assessment/Plan: 1. Right arm numbness- MRI brain is negative for acute stroke. MRA brain showed short segment moderate stenosis in the mid left M1 segment, short segment moderate stenosis within the proximal left P2 and proximal P3 segments. Neurology is following. 2-D echo and carotid Doppler are pending at this time. 2. ? Cervical spine DJD- patient has a history of cervical spine disease, her last MRI of cervical spine from 2001 showed broad-based central disc protrusion C6-7 without nerve root cutoff or spinal stenosis. Will repeat MRI cervical spine. 3. Hypertension- blood pressure is stable, continue Cardizem. 4. Diabetes mellitus- metformin on hold, continue sliding scale insulin with NovoLog. 5. DVT prophylaxis- Lovenox  Code Status: Full code Family Communication: *Discussed with patient's husband at bedside Disposition Plan: Pending improvement in neurological symptoms   Consultants:  Neurology  Procedures:  Carotid Doppler  Echocardiogram  Antibiotics:  None  HPI/Subjective: 70 year old female presented with right arm numbness  denies any slurred speech no droop strength appear to be intact, this was associated with a headache, no chest pain patient was brought to emergency department by husband evaluated and rash and cold stroke was called.Patient has history of prior CVA she is on aspirin 325 a day.  This morning patient continues to have right arm numbness. She also has a history of chronic neck pain, recently was seen by Dr. Geradine Girt, neurosurgery as outpatient. Patient says that she went for a long trip in September and says that she had neck spasm.   Objective: Filed Vitals:   06/22/15 1200 06/22/15 1425  BP: 155/62 134/78  Pulse: 77 76  Temp: 98.2 F (36.8 C) 98.1 F (36.7 C)  Resp: 16 16    Intake/Output Summary (Last 24 hours) at  06/22/15 1518 Last data filed at 06/22/15 0233  Gross per 24 hour  Intake    240 ml  Output    250 ml  Net    -10 ml   Filed Weights   06/22/15 0200  Weight: 64.955 kg (143 lb 3.2 oz)    Exam:   General:  Appears in no acute distress  Cardiovascular: S1-S2 regular  Respiratory: Clear to auscultation bilaterally  Abdomen: Soft, nontender, no organomegaly  Musculoskeletal: No cyanosis/clubbing/edema of the lower extremities   Data Reviewed: Basic Metabolic Panel:  Recent Labs Lab 06/21/15 2200 06/21/15 2212  NA 142 143  K 3.4* 3.3*  CL 99* 98*  CO2 29  --   GLUCOSE 154* 144*  BUN 23* 27*  CREATININE 0.99 1.00  CALCIUM 9.7  --    Liver Function Tests:  Recent Labs Lab 06/21/15 2200  AST 22  ALT 22  ALKPHOS 72  BILITOT 0.2*  PROT 6.9  ALBUMIN 4.0   CBC:  Recent Labs Lab 06/21/15 2200 06/21/15 2212  WBC 9.6  --   NEUTROABS 5.4  --   HGB 15.3* 16.0*  HCT 45.7 47.0*  MCV 84.8  --   PLT 281  --    Cardiac Enzymes:  Recent Labs Lab 06/22/15 0402  TROPONINI <0.03    CBG:  Recent Labs Lab 06/21/15 2211 06/22/15 0427 06/22/15 0803 06/22/15 1157  GLUCAP 166* 156* 117* 132*    Recent Results (from the past 240 hour(s))  MRSA PCR Screening     Status: None   Collection Time: 06/22/15  4:45 AM  Result Value Ref Range Status   MRSA by PCR NEGATIVE  NEGATIVE Final    Comment:        The GeneXpert MRSA Assay (FDA approved for NASAL specimens only), is one component of a comprehensive MRSA colonization surveillance program. It is not intended to diagnose MRSA infection nor to guide or monitor treatment for MRSA infections.      Studies: Dg Chest 2 View  06/22/2015  CLINICAL DATA:  TIA symptoms, history of hypertension and diabetes, former smoker, no chest complaints. EXAM: CHEST  2 VIEW COMPARISON:  Chest x-ray of March 02, 2014 FINDINGS: The lungs are mildly hyperinflated and clear. The heart and pulmonary vascularity are normal.  The mediastinum is normal in width. There is no pleural effusion. The bony thorax exhibits no acute abnormality. IMPRESSION: Hyperinflation consistent with COPD. There is no pneumonia nor other acute cardiopulmonary abnormality. Electronically Signed   By: David  Martinique M.D.   On: 06/22/2015 07:51   Ct Head Wo Contrast  06/21/2015  CLINICAL DATA:  Code stroke. Sudden onset of right arm numbness and weakness. EXAM: CT HEAD WITHOUT CONTRAST TECHNIQUE: Contiguous axial images were obtained from the base of the skull through the vertex without intravenous contrast. COMPARISON:  Head CT and brain MRI November 2015 FINDINGS: Small remote lacunar infarct in the posterior limb of the left internal capsule. No CT findings of acute ischemia. Chronic small vessel ischemic change, stable from prior. No intracranial hemorrhage, mass effect, or midline shift. No hydrocephalus. 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 The basilar cisterns are patent. No intracranial fluid collection. Calvarium is intact. Scattered mucosal thickening in the paranasal sinuses. Left mastoid air cell opacification with minimal progression from prior. IMPRESSION: Remote infarct in the posterior limb of the left internal capsule. No CT findings of acute ischemia or acute intracranial abnormality. These results were called by telephone at the time of interpretation on 06/21/2015 at 10:18 pm to Dr. Nicole Kindred , who verbally acknowledged these results. Electronically Signed   By: Jeb Levering M.D.   On: 06/21/2015 22:22   Mr Jodene Nam Head Wo Contrast  06/22/2015  CLINICAL DATA:  Initial evaluation for recurrent right-sided numbness. EXAM: MRI HEAD WITHOUT CONTRAST MRA HEAD WITHOUT CONTRAST TECHNIQUE: Multiplanar, multiecho pulse sequences of the brain and surrounding structures were obtained without intravenous contrast. Angiographic images of the head were obtained using MRA technique without  contrast. COMPARISON:  Prior CT from earlier the same day. FINDINGS: MRI HEAD FINDINGS Cerebral volume within normal limits for patient age. Scattered patchy T2/FLAIR hyperintensity within the periventricular and deep white matter both cerebral hemispheres most consistent with chronic small vessel ischemic disease, mild in nature. Small remote lacunar infarct within the left thalamus/ left internal capsule. No other areas of chronic infarction. No abnormal foci of restricted diffusion to suggest acute intracranial infarct. Gray-white matter differentiation maintained. Major intracranial vascular flow voids are preserved. No acute or chronic intracranial hemorrhage. No mass lesion, midline shift, or mass effect. No hydrocephalus. No extra-axial fluid collection. Craniocervical junction within normal limits. Mega cisterna magna noted. Pituitary gland within normal limits. No acute abnormality about the orbits. Sequela prior bilateral lens extraction. Mild scattered mucosal thickening within ethmoidal air cells. Paranasal sinuses are otherwise clear. Left mastoid effusion noted. Minimal scattered opacity within the right mastoid air cells. Bone marrow signal intensity within normal limits. No scalp soft tissue abnormality. MRA HEAD FINDINGS ANTERIOR CIRCULATION: Visualized distal cervical segments of the internal carotid arteries are patent with antegrade flow. Petrous segments widely patent. Focal mild to moderate  stenosis within the distal cavernous left ICA (series 5, image 36). Cavernous and supraclinoid ICAs otherwise patent without significant stenosis. A1 segments patent. Right A1 segment hypoplastic. Anterior communicating artery normal. Anterior cerebral arteries well opacified. Right M1 segment widely patent without stenosis or occlusion. Right MCA bifurcation normal. Distal right MCA branches well opacified. There is a focal short-segment moderate stenosis within the proximal -mid left M1 segment (series  502, image 9). Left M1 segment otherwise widely patent. MCA bifurcation on the left within normal limits. Distal left MCA branches opacified and fairly symmetric with the right. Vertebral arteries patent to the vertebrobasilar junction. Posterior inferior cerebral arteries patent bilaterally. Basilar artery widely patent. Dominant left anterior inferior cerebral artery. Superior cerebral arteries well opacified. Both posterior sore wires arise in the basilar artery. Superimposed short-segment moderate stenosis within mid left P2 segment (Series 505, image 9). Additional moderate stenosis more distally within proximal left P3 segment (series 505, image 4). No significant stenosis within the right PCA, although probable distal small vessel irregularity within its distal branches. Small left posterior communicating artery noted. No aneurysm or vascular malformation. IMPRESSION: MRI HEAD IMPRESSION: 1. No acute intracranial infarct or other abnormality identified. 2. Remote lacunar infarct within the left thalamus/posterior limb of the left internal capsule. 3. Mild chronic small vessel ischemic disease. MRA HEAD IMPRESSION: 1. No large or proximal arterial branch occlusion within the intracranial circulation. 2. Short-segment moderate stenosis within the mid left M1 segment. 3. Focal mild to moderate stenosis within the distal cavernous left ICA. 4. Short-segment moderate stenoses within the proximal left P2 and proximal P3 segments. Electronically Signed   By: Jeannine Boga M.D.   On: 06/22/2015 02:01   Mr Brain Wo Contrast  06/22/2015  CLINICAL DATA:  Initial evaluation for recurrent right-sided numbness. EXAM: MRI HEAD WITHOUT CONTRAST MRA HEAD WITHOUT CONTRAST TECHNIQUE: Multiplanar, multiecho pulse sequences of the brain and surrounding structures were obtained without intravenous contrast. Angiographic images of the head were obtained using MRA technique without contrast. COMPARISON:  Prior CT from  earlier the same day. FINDINGS: MRI HEAD FINDINGS Cerebral volume within normal limits for patient age. Scattered patchy T2/FLAIR hyperintensity within the periventricular and deep white matter both cerebral hemispheres most consistent with chronic small vessel ischemic disease, mild in nature. Small remote lacunar infarct within the left thalamus/ left internal capsule. No other areas of chronic infarction. No abnormal foci of restricted diffusion to suggest acute intracranial infarct. Gray-white matter differentiation maintained. Major intracranial vascular flow voids are preserved. No acute or chronic intracranial hemorrhage. No mass lesion, midline shift, or mass effect. No hydrocephalus. No extra-axial fluid collection. Craniocervical junction within normal limits. Mega cisterna magna noted. Pituitary gland within normal limits. No acute abnormality about the orbits. Sequela prior bilateral lens extraction. Mild scattered mucosal thickening within ethmoidal air cells. Paranasal sinuses are otherwise clear. Left mastoid effusion noted. Minimal scattered opacity within the right mastoid air cells. Bone marrow signal intensity within normal limits. No scalp soft tissue abnormality. MRA HEAD FINDINGS ANTERIOR CIRCULATION: Visualized distal cervical segments of the internal carotid arteries are patent with antegrade flow. Petrous segments widely patent. Focal mild to moderate stenosis within the distal cavernous left ICA (series 5, image 36). Cavernous and supraclinoid ICAs otherwise patent without significant stenosis. A1 segments patent. Right A1 segment hypoplastic. Anterior communicating artery normal. Anterior cerebral arteries well opacified. Right M1 segment widely patent without stenosis or occlusion. Right MCA bifurcation normal. Distal right MCA branches well opacified. There is a focal short-segment  moderate stenosis within the proximal -mid left M1 segment (series 502, image 9). Left M1 segment otherwise  widely patent. MCA bifurcation on the left within normal limits. Distal left MCA branches opacified and fairly symmetric with the right. Vertebral arteries patent to the vertebrobasilar junction. Posterior inferior cerebral arteries patent bilaterally. Basilar artery widely patent. Dominant left anterior inferior cerebral artery. Superior cerebral arteries well opacified. Both posterior sore wires arise in the basilar artery. Superimposed short-segment moderate stenosis within mid left P2 segment (Series 505, image 9). Additional moderate stenosis more distally within proximal left P3 segment (series 505, image 4). No significant stenosis within the right PCA, although probable distal small vessel irregularity within its distal branches. Small left posterior communicating artery noted. No aneurysm or vascular malformation. IMPRESSION: MRI HEAD IMPRESSION: 1. No acute intracranial infarct or other abnormality identified. 2. Remote lacunar infarct within the left thalamus/posterior limb of the left internal capsule. 3. Mild chronic small vessel ischemic disease. MRA HEAD IMPRESSION: 1. No large or proximal arterial branch occlusion within the intracranial circulation. 2. Short-segment moderate stenosis within the mid left M1 segment. 3. Focal mild to moderate stenosis within the distal cavernous left ICA. 4. Short-segment moderate stenoses within the proximal left P2 and proximal P3 segments. Electronically Signed   By: Jeannine Boga M.D.   On: 06/22/2015 02:01    Scheduled Meds: . aspirin  325 mg Oral q morning - 10a  . cyclobenzaprine  10 mg Oral TID  . diltiazem  240 mg Oral q morning - 10a  . ezetimibe  10 mg Oral q morning - 10a  . insulin aspart  0-9 Units Subcutaneous 6 times per day  . simvastatin  20 mg Oral q1800   Continuous Infusions: . sodium chloride 75 mL/hr (06/22/15 0233)    Active Problems:   TIA (transient ischemic attack)   Hypertension   Type 2 diabetes, diet controlled  ([place])   Essential hypertension   CVA (cerebral infarction)   Hypokalemia    Time spent: 25 min    [facility]  [facility] Pager [phone]*. If 7PM-7AM, please contact night-coverage at [email or web address], password [facility] 06/22/2015, 3:18 PM  LOS: 0 days

## 2015-06-22 NOTE — ED Notes (Signed)
Attempter report

## 2015-06-22 NOTE — Progress Notes (Signed)
Pt arrived on unit 0130 hrs, A&o, no obvious distress, was able to ambulate to bed w/o assist, does c/o headache 4/10, NIHSS 1 for right side sensation, admissions paged, orders received, admission orders implemented, patient oriented to room and equipment

## 2015-06-22 NOTE — Progress Notes (Signed)
STROKE TEAM PROGRESS NOTE   HISTORY OF PRESENT ILLNESS  [name] is an 70 y.o. female history of hypertension, hyperlipidemia, diabetes mellitus and previous stroke and November 2015, presenting with recurrent acute numbness involving right extremities. Onset was at 9:15 PM tonight. He did not develop numbness involving her face and has not noticed speech changes. She's been taking aspirin 325 mg per day. CT scan of her head showed no acute changes. Old infarction involving posterior limb of left internal capsule was noted. NIH stroke score was 1 for numbness. Patient is being admitted for probable acute recurrent left subcortical stroke.  LSN: 9:15 PM on 06/21/2015 tPA Given: No: Minimal deficits mRankin:   SUBJECTIVE (INTERVAL HISTORY) Patient's husband is at the bedside. Patient's right arm still feels numb and heavy.   OBJECTIVE Temp:  [97.5 F (36.4 C)-98.2 F (36.8 C)] 98.2 F (36.8 C) (02/23 0600) Pulse Rate:  [71-91] 88 (02/23 0600) Cardiac Rhythm:  [-] Normal sinus rhythm (02/23 0115) Resp:  [10-21] 15 (02/23 0600) BP: (125-174)/(59-102) 133/59 mmHg (02/23 0600) SpO2:  [93 %-99 %] 97 % (02/23 0600) Weight:  [64.955 kg (143 lb 3.2 oz)] 64.955 kg (143 lb 3.2 oz) (02/23 0200)  CBC:  Recent Labs Lab 06/21/15 2200 06/21/15 2212  WBC 9.6  --   NEUTROABS 5.4  --   HGB 15.3* 16.0*  HCT 45.7 47.0*  MCV 84.8  --   PLT 281  --     Basic Metabolic Panel:  Recent Labs Lab 06/21/15 2200 06/21/15 2212  NA 142 143  K 3.4* 3.3*  CL 99* 98*  CO2 29  --   GLUCOSE 154* 144*  BUN 23* 27*  CREATININE 0.99 1.00  CALCIUM 9.7  --     Lipid Panel:    Component Value Date/Time   CHOL 134 06/22/2015 0402   TRIG 57 06/22/2015 0402   HDL 42 06/22/2015 0402   CHOLHDL 3.2 06/22/2015 0402   VLDL 11 06/22/2015 0402   LDLCALC 81 06/22/2015 0402   HgbA1c:  Lab Results  Component Value Date   HGBA1C 6.9* 03/03/2014   Urine Drug Screen:    Component Value Date/Time   LABOPIA NONE DETECTED 03/02/2014 2102   COCAINSCRNUR NONE DETECTED 03/02/2014 2102   LABBENZ NONE DETECTED 03/02/2014 2102   AMPHETMU NONE DETECTED 03/02/2014 2102   THCU NONE DETECTED 03/02/2014 2102   LABBARB NONE DETECTED 03/02/2014 2102      IMAGING  Dg Chest 2 View 06/22/2015   Hyperinflation consistent with COPD. There is no pneumonia nor other acute cardiopulmonary abnormality.    Ct Head Wo Contrast 06/21/2015   Remote infarct in the posterior limb of the left internal capsule. No CT findings of acute ischemia or acute intracranial abnormality.     Mr Jodene Nam Head Wo Contrast 06/22/2015    MRI HEAD  1. No acute intracranial infarct or other abnormality identified.  2. Remote lacunar infarct within the left thalamus/posterior limb of the left internal capsule.  3. Mild chronic small vessel ischemic disease.   MRA HEAD  1. No large or proximal arterial branch occlusion within the intracranial circulation.  2. Short-segment moderate stenosis within the mid left M1 segment.  3. Focal mild to moderate stenosis within the distal cavernous left ICA.  4. Short-segment moderate stenoses within the proximal left P2 and proximal P3 segments.     PHYSICAL EXAM  PHYSICAL EXAM Physical exam: Exam: Gen: NAD Eyes: anicteric sclerae, moist conjunctivae  CV: no MRG, no carotid bruits, no peripheral edema Mental Status: Alert, follows commands, good historian  Neuro: Detailed Neurologic Exam  Speech:    No aphasia, no dysarthria  Cranial Nerves:    The pupils are equal, round, and reactive to light.. Attempted, Fundi not visualized.  EOMI. No gaze preference. Visual fields full. Face symmetric, Tongue midline. Hearing intact to voice. Shoulder shrug intact  Motor Observation:    no involuntary movements noted. Tone appears normal.     Strength:    5/5     Sensation:  Decreased sensation in the right arm.  Plantars downgoing.    ASSESSMENT/PLAN Ms.  [name] is a 70 y.o. female with history of hypertension, hyperlipidemia, diabetes mellitus, and previous stroke presenting with numbness of the right extremities. She did not receive IV t-PA due to minimal deficits.  Possible TIA:  Dominant   Resultant  Right arm numbness.  MRI  no infarct  MRA: 1. Short-segment moderate stenosis within the mid left M1 segment.   2. Focal mild to moderate stenosis within the distal cavernous left ICA.   3. Short-segment moderate stenoses within the proximal left P2 and proximal  P3 segments.     Carotid Doppler - no significant stenosis by Dopplers 03/10/2015  2D Echo - pending  LDL - 81  HgbA1c pending  VTE prophylaxis - SCDs  Diet heart healthy/carb modified Room service appropriate?: Yes; Fluid consistency:: Thin  aspirin 325 mg daily prior to admission, now on aspirin 325 mg daily . Will change to Plavix.   Patient counseled to be compliant with her antithrombotic medications  Ongoing aggressive stroke risk factor management  Therapy recommendations:  Pending  Disposition:  Pending  Hypertension  Stable  Permissive hypertension (OK if < 220/120) but gradually normalize in 5-7 days  Hyperlipidemia  Home meds:  On Zetia  LDL 81, goal < 70  Patient reports an intolerance to statins, her muscles ache. Considering the stenosis within the left M1 segment of the MCA will discuss with patient resumption of a statin possibly pravastatin that may cause less muscle aches.  Diabetes  HgbA1c pending, goal < 7  Other Stroke Risk Factors  Advanced age  Cigarette smoker, quit smoking 23 years ago   Hx stroke/TIA    Family hx stroke (Father)   Other Active Problems  Mild hypokalemia  Mildly elevated BUN  Hospital day # 0  Mikey Bussing PA-C [facility] Pager [phone] 06/22/2015, 2:19 PM   Personally examined patient and images, and have participated in and made any corrections needed to  history, physical, neuro exam,assessment and plan as stated above.  I have personally obtained the history, evaluated lab date, reviewed imaging studies and agree with radiology interpretations. Discussed case with Dr. Antony Contras who agreed with plan above.   Sarina Ill, MD Stroke Neurology [phone] [facility]     To contact Stroke Continuity provider, please refer to [email or web address] After hours, contact General Neurology

## 2015-06-22 NOTE — H&P (Addendum)
PCP:  Vidal Schwalbe, MD  Neurology [facility] Referring provider Dr. Jeneen Rinks   Chief Complaint: Right side numbness  HPI: [name] is a 70 y.o. female   has a past medical history of Hypertension; Hyperlipidemia; Vitamin D deficiency; Type 2 diabetes, diet controlled ([place]); Bleeding internal hemorrhoids; and Wears glasses.   Presented with right arm numbness at 9:15 PM denies any slurred speech no droop strength appear to be intact, this was associated with a headache, no chest pain patient was brought to emergency department by husband evaluated and rash and cold stroke was called. Patient has history of prior CVA she is on aspirin 325 a day.  IN ER: She was seen by Dr. Nicole Kindred from neurology. CT scan of the head was nonacute and age stroke score 1 Neurology recommends admission to medicine for stroke workup. Due to mild symptoms no TPA was given  Regarding pertinent past history: Patient have had TIAs in the past admitted in November for CVA of the left intracranial capsule as well as parietal small infarcts that time her aspirin was increased to 325 had some residual right upper extremity numbness since then. Hemoglobin A1c 6.5  Hospitalist was called for admission for possible CVA  Review of Systems:    Pertinent positives include: Right Arm numbness  Constitutional:  No weight loss, night sweats, Fevers, chills, fatigue, weight loss  HEENT:  No headaches, Difficulty swallowing,Tooth/dental problems,Sore throat,  No sneezing, itching, ear ache, nasal congestion, post nasal drip,  Cardio-vascular:  No chest pain, Orthopnea, PND, anasarca, dizziness, palpitations.no Bilateral lower extremity swelling  GI:  No heartburn, indigestion, abdominal pain, nausea, vomiting, diarrhea, change in bowel habits, loss of appetite, melena, blood in stool, hematemesis Resp:  no shortness of breath at rest. No dyspnea on exertion, No excess mucus, no productive cough, No non-productive  cough, No coughing up of blood.No change in color of mucus.No wheezing. Skin:  no rash or lesions. No jaundice GU:  no dysuria, change in color of urine, no urgency or frequency. No straining to urinate.  No flank pain.  Musculoskeletal:  No joint pain or no joint swelling. No decreased range of motion. No back pain.  Psych:  No change in mood or affect. No depression or anxiety. No memory loss.  Neuro:    no weakness, no double vision, no gait abnormality, no slurred speech, no confusion  Otherwise ROS are negative except for above, 10 systems were reviewed  Past Medical History: Past Medical History  Diagnosis Date  . Hypertension   . Hyperlipidemia   . Vitamin D deficiency   . Type 2 diabetes, diet controlled ([place])   . Bleeding internal hemorrhoids   . Wears glasses    Past Surgical History  Procedure Laterality Date  . Lumbar spine surgery  1970's  . Abdominal hysterectomy  1980's  . Laparoscopic cholecystectomy  1995  . Plantar fascia surgery Right 2003  . Anterior cervical decomp/discectomy fusion  05-19-2008    C6 -- C7  . Shoulder arthroscopy/  debridement labrum and rotator cuff/ bursectomy/ acromioplasty/  cal release/  excision distal clavical Bilateral right 09-15-2008/   left  10-17-2008  . Cataract extraction w/ intraocular lens  implant, bilateral  2011  . Hemorrhoid surgery N/A 03/02/2014    Procedure: HEMORRHOIDOPEXY;  Surgeon: Leighton Ruff, MD;  Location: [facility], [place];  Service: General;  Laterality: N/A;     Medications: Prior to Admission medications   Medication Sig Start Date End Date Taking? Authorizing Provider  aspirin 325 MG EC tablet Take 1 tablet (325 mg total) by mouth every morning. 03/04/14  Yes Shanker Kristeen Mans, MD  Cholecalciferol (VITAMIN D3) 50000 UNITS CAPS Take 1 capsule by mouth once a week.   Yes Historical Provider, MD  diltiazem (DILT-XR) 240 MG 24 hr capsule Take 240 mg by mouth every morning.   Yes Historical  Provider, MD  ezetimibe (ZETIA) 10 MG tablet Take 10 mg by mouth every morning.   Yes Historical Provider, MD  fluticasone (FLONASE) 50 MCG/ACT nasal spray Place 1 spray into both nostrils as needed for allergies.  05/03/14  Yes Historical Provider, MD  lisinopril-hydrochlorothiazide (PRINZIDE,ZESTORETIC) 20-12.5 MG per tablet Take 1 tablet by mouth every morning.   Yes Historical Provider, MD  metFORMIN (GLUCOPHAGE-XR) 500 MG 24 hr tablet TAKE 2 TABLETS BY MOUTH WITH EVENING MEAL 01/25/15  Yes Historical Provider, MD  simvastatin (ZOCOR) 20 MG tablet Take 1 tablet (20 mg total) by mouth daily at 6 PM. 03/03/14  Yes Shanker Kristeen Mans, MD    Allergies:   Allergies  Allergen Reactions  . Vicodin [Hydrocodone-Acetaminophen] Swelling  . Crestor [Rosuvastatin Calcium] Other (See Comments)    MYALGIA  . Lipitor [Atorvastatin Calcium] Other (See Comments)    MYALGIA    Social History:  Ambulatory   Independently  Lives at home  With family     reports that she quit smoking about 23 years ago. Her smoking use included Cigarettes. She has a 45 pack-year smoking history. She has never used smokeless tobacco. She reports that she does not drink alcohol or use illicit drugs.     Family History: family history includes Cancer in her sister; Heart disease in her brother, brother, sister, and sister; Hypertension in her brother, father, and sister; Stroke in her father.    Physical Exam: Patient Vitals for the past 24 hrs:  BP Temp Temp src Pulse Resp SpO2  06/21/15 2332 - - - 82 21 95 %  06/21/15 2330 163/68 mmHg - - - - -  06/21/15 2315 140/70 mmHg - - 78 20 93 %  06/21/15 2300 139/68 mmHg - - 77 20 94 %  06/21/15 2245 148/72 mmHg - - 74 15 96 %  06/21/15 2230 157/72 mmHg - - 76 19 97 %  06/21/15 2229 - 97.5 F (36.4 C) - - - -  06/21/15 2216 174/97 mmHg - - 84 10 98 %  06/21/15 2145 (!) 146/102 mmHg 97.5 F (36.4 C) Oral 91 18 96 %    1. General:  in No Acute distress 2.  Psychological: Alert and  Oriented 3. Head/ENT:   Dry Mucous Membranes                          Head Non traumatic, neck supple                          Normal  Dentition 4. SKIN: normal  Skin turgor,  Skin clean Dry and intact no rash 5. Heart: Regular rate and rhythm no  Murmur, Rub or gallop 6. Lungs: Clear to auscultation bilaterally, no wheezes or crackles   7. Abdomen: Soft, non-tender, Non distended 8. Lower extremities: no clubbing, cyanosis, or edema 9. Neurologically strength 5 out of 5 in all 4 extremities cranial nerves II through XII intact diminished sensation in her right arm 10. MSK: Normal range of motion  body mass index is unknown because there  is no weight on file.   Labs on Admission:   Results for orders placed or performed during the hospital encounter of 06/21/15 (from the past 24 hour(s))  Protime-INR     Status: None   Collection Time: 06/21/15 10:00 PM  Result Value Ref Range   Prothrombin Time 12.4 11.6 - 15.2 seconds   INR 0.90 0.00 - 1.49  APTT     Status: None   Collection Time: 06/21/15 10:00 PM  Result Value Ref Range   aPTT 27 24 - 37 seconds  CBC     Status: Abnormal   Collection Time: 06/21/15 10:00 PM  Result Value Ref Range   WBC 9.6 4.0 - 10.5 K/uL   RBC 5.39 (H) 3.87 - 5.11 MIL/uL   Hemoglobin 15.3 (H) 12.0 - 15.0 g/dL   HCT 45.7 36.0 - 46.0 %   MCV 84.8 78.0 - 100.0 fL   MCH 28.4 26.0 - 34.0 pg   MCHC 33.5 30.0 - 36.0 g/dL   RDW 12.9 11.5 - 15.5 %   Platelets 281 150 - 400 K/uL  Differential     Status: None   Collection Time: 06/21/15 10:00 PM  Result Value Ref Range   Neutrophils Relative % 56 %   Neutro Abs 5.4 1.7 - 7.7 K/uL   Lymphocytes Relative 33 %   Lymphs Abs 3.1 0.7 - 4.0 K/uL   Monocytes Relative 8 %   Monocytes Absolute 0.8 0.1 - 1.0 K/uL   Eosinophils Relative 3 %   Eosinophils Absolute 0.3 0.0 - 0.7 K/uL   Basophils Relative 0 %   Basophils Absolute 0.0 0.0 - 0.1 K/uL  Comprehensive metabolic panel     Status:  Abnormal   Collection Time: 06/21/15 10:00 PM  Result Value Ref Range   Sodium 142 135 - 145 mmol/L   Potassium 3.4 (L) 3.5 - 5.1 mmol/L   Chloride 99 (L) 101 - 111 mmol/L   CO2 29 22 - 32 mmol/L   Glucose, Bld 154 (H) 65 - 99 mg/dL   BUN 23 (H) 6 - 20 mg/dL   Creatinine, Ser 0.99 0.44 - 1.00 mg/dL   Calcium 9.7 8.9 - 10.3 mg/dL   Total Protein 6.9 6.5 - 8.1 g/dL   Albumin 4.0 3.5 - 5.0 g/dL   AST 22 15 - 41 U/L   ALT 22 14 - 54 U/L   Alkaline Phosphatase 72 38 - 126 U/L   Total Bilirubin 0.2 (L) 0.3 - 1.2 mg/dL   GFR calc non Af Amer 56 (L) >60 mL/min   GFR calc Af Amer >60 >60 mL/min   Anion gap 14 5 - 15  I-stat troponin, ED (not at [facility], [facility])     Status: None   Collection Time: 06/21/15 10:10 PM  Result Value Ref Range   Troponin i, poc 0.00 0.00 - 0.08 ng/mL   Comment 3          CBG monitoring, ED     Status: Abnormal   Collection Time: 06/21/15 10:11 PM  Result Value Ref Range   Glucose-Capillary 166 (H) 65 - 99 mg/dL  I-Stat Chem 8, ED  (not at [facility], [facility])     Status: Abnormal   Collection Time: 06/21/15 10:12 PM  Result Value Ref Range   Sodium 143 135 - 145 mmol/L   Potassium 3.3 (L) 3.5 - 5.1 mmol/L   Chloride 98 (L) 101 - 111 mmol/L   BUN 27 (H) 6 - 20 mg/dL  Creatinine, Ser 1.00 0.44 - 1.00 mg/dL   Glucose, Bld 144 (H) 65 - 99 mg/dL   Calcium, Ion 1.15 1.13 - 1.30 mmol/L   TCO2 31 0 - 100 mmol/L   Hemoglobin 16.0 (H) 12.0 - 15.0 g/dL   HCT 47.0 (H) 36.0 - 46.0 %    UA ordered  Lab Results  Component Value Date   HGBA1C 6.9* 03/03/2014    CrCl cannot be calculated (Unknown ideal weight.).  BNP (last 3 results) No results for input(s): PROBNP in the last 8760 hours.  Other results:  I have pearsonaly reviewed this: ECG REPORT  Rate: 88  Rhythm: Sinus rhythm ST&T Change: No evidence of ischemia QTC 416  There were no vitals filed for this visit.   Cultures: No results found for: SDES, SPECREQUEST, CULT, REPTSTATUS   Radiological Exams on  Admission: Ct Head Wo Contrast  06/21/2015  CLINICAL DATA:  Code stroke. Sudden onset of right arm numbness and weakness. EXAM: CT HEAD WITHOUT CONTRAST TECHNIQUE: Contiguous axial images were obtained from the base of the skull through the vertex without intravenous contrast. COMPARISON:  Head CT and brain MRI November 2015 FINDINGS: Small remote lacunar infarct in the posterior limb of the left internal capsule. No CT findings of acute ischemia. Chronic small vessel ischemic change, stable from prior. No intracranial hemorrhage, mass effect, or midline shift. No hydrocephalus. 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 The basilar cisterns are patent. No intracranial fluid collection. Calvarium is intact. Scattered mucosal thickening in the paranasal sinuses. Left mastoid air cell opacification with minimal progression from prior. IMPRESSION: Remote infarct in the posterior limb of the left internal capsule. No CT findings of acute ischemia or acute intracranial abnormality. These results were called by telephone at the time of interpretation on 06/21/2015 at 10:18 pm to Dr. Nicole Kindred , who verbally acknowledged these results. Electronically Signed   By: Jeb Levering M.D.   On: 06/21/2015 22:22    Chart has been reviewed  Family  at  Bedside  plan of care was discussed with  Husband [name] [id number] cell  Assessment/Plan  70 year old female with past history of TIA and CVA hypertension presents with right arm numbness worrisome physical recurrent stroke allergy has been consulted  Present on Admission:  . TIA (transient ischemic attack) vs CVA  - will admit based on TIA/CVA protocol, await results of MRA/MRI, Carotid Doppler and Echo, obtain cardiac enzymes,  ECG,   Lipid panel, TSH. Order PT/OT evaluation. Will make sure patient is on antiplatelet agent.   Neurology consulted.     . Hypertension - permissive hypertension .  Hypokalemia will replace DM - SSI hold metformin  Prophylaxis: SCD   CODE STATUS:  FULL CODE  as per patient    Disposition:  To home once workup is complete and patient is stable  Other plan as per orders.  I have spent a total of 56 min on this admission      Cristine Daw 06/22/2015, 1:58 AM    [facility]  Pager [phone]   after 2 AM please page floor coverage PA If 7AM-7PM, please contact the day team taking care of the patient  [email or web address]  Password [email or web address]

## 2015-06-22 NOTE — ED Notes (Signed)
Dr. Doutova at bedside.  

## 2015-06-22 NOTE — ED Notes (Signed)
Pt just returned from MRI, and neuro check completed with vital signs

## 2015-06-22 NOTE — ED Provider Notes (Signed)
CSN: [id number]     Arrival date & time 06/21/15  2140 History   First MD Initiated Contact with Patient 06/21/15 2215     Chief Complaint  Patient presents with  . Numbness    An emergency department physician performed an initial assessment on this suspected stroke patient at 2153.  HPI  Patient presents for evaluation of numbness of her left arm. Significant past medical history of previous left hemispheric CVA that gave her some right-sided numbness of her arm and leg. This almost completely resolved as a small island of decreased sensation on her right forearm. Tonight at 9:15 PM she was sitting at home watching a movie in a recliner. She felt her right arm became numb and heavy. Also feels numbness in her leg. Denies paresthesias or pins and needle feelings, but describes decreased sensation. No speech difficulty. No visual changes.  Past Medical History  Diagnosis Date  . Hypertension   . Hyperlipidemia   . Vitamin D deficiency   . Type 2 diabetes, diet controlled ([place])   . Bleeding internal hemorrhoids   . Wears glasses    Past Surgical History  Procedure Laterality Date  . Lumbar spine surgery  1970's  . Abdominal hysterectomy  1980's  . Laparoscopic cholecystectomy  1995  . Plantar fascia surgery Right 2003  . Anterior cervical decomp/discectomy fusion  05-19-2008    C6 -- C7  . Shoulder arthroscopy/  debridement labrum and rotator cuff/ bursectomy/ acromioplasty/  cal release/  excision distal clavical Bilateral right 09-15-2008/   left  10-17-2008  . Cataract extraction w/ intraocular lens  implant, bilateral  2011  . Hemorrhoid surgery N/A 03/02/2014    Procedure: HEMORRHOIDOPEXY;  Surgeon: Leighton Ruff, MD;  Location: [facility];  Service: General;  Laterality: N/A;   Family History  Problem Relation Age of Onset  . Hypertension Father   . Stroke Father   . Hypertension Sister   . Heart disease Brother   . Hypertension Brother   . Heart disease  Brother   . Heart disease Sister   . Heart disease Sister   . Cancer Sister     breast   Social History  Substance Use Topics  . Smoking status: Former Smoker -- 1.50 packs/day for 30 years    Types: Cigarettes    Quit date: 02/29/1992  . Smokeless tobacco: Never Used  . Alcohol Use: No   OB History    No data available     Review of Systems  Constitutional: Negative for fever, chills, diaphoresis, appetite change and fatigue.  HENT: Negative for mouth sores, sore throat and trouble swallowing.   Eyes: Negative for visual disturbance.  Respiratory: Negative for cough, chest tightness, shortness of breath and wheezing.   Cardiovascular: Negative for chest pain.  Gastrointestinal: Negative for nausea, vomiting, abdominal pain, diarrhea and abdominal distention.  Endocrine: Negative for polydipsia, polyphagia and polyuria.  Genitourinary: Negative for dysuria, frequency and hematuria.  Musculoskeletal: Negative for gait problem.  Skin: Negative for color change, pallor and rash.  Neurological: Positive for numbness. Negative for dizziness, syncope, light-headedness and headaches.  Hematological: Does not bruise/bleed easily.  Psychiatric/Behavioral: Negative for behavioral problems and confusion.      Allergies  Vicodin; Crestor; and Lipitor  Home Medications   Prior to Admission medications   Medication Sig Start Date End Date Taking? Authorizing Provider  aspirin 325 MG EC tablet Take 1 tablet (325 mg total) by mouth every morning. 03/04/14  Yes Shanker Kristeen Mans,  MD  Cholecalciferol (VITAMIN D3) 50000 UNITS CAPS Take 1 capsule by mouth once a week.   Yes Historical Provider, MD  diltiazem (DILT-XR) 240 MG 24 hr capsule Take 240 mg by mouth every morning.   Yes Historical Provider, MD  ezetimibe (ZETIA) 10 MG tablet Take 10 mg by mouth every morning.   Yes Historical Provider, MD  fluticasone (FLONASE) 50 MCG/ACT nasal spray Place 1 spray into both nostrils as needed for  allergies.  05/03/14  Yes Historical Provider, MD  lisinopril-hydrochlorothiazide (PRINZIDE,ZESTORETIC) 20-12.5 MG per tablet Take 1 tablet by mouth every morning.   Yes Historical Provider, MD  metFORMIN (GLUCOPHAGE-XR) 500 MG 24 hr tablet TAKE 2 TABLETS BY MOUTH WITH EVENING MEAL 01/25/15  Yes Historical Provider, MD  simvastatin (ZOCOR) 20 MG tablet Take 1 tablet (20 mg total) by mouth daily at 6 PM. 03/03/14  Yes Shanker Kristeen Mans, MD   BP 163/68 mmHg  Pulse 82  Temp(Src) 97.5 F (36.4 C) (Oral)  Resp 21  SpO2 95% Physical Exam  Constitutional: She is oriented to person, place, and time. She appears well-developed and well-nourished. No distress.  HENT:  Head: Normocephalic.  Eyes: Conjunctivae are normal. Pupils are equal, round, and reactive to light. No scleral icterus.  Neck: Normal range of motion. Neck supple. No thyromegaly present.  Cardiovascular: Normal rate and regular rhythm.  Exam reveals no gallop and no friction rub.   No murmur heard. Pulmonary/Chest: Effort normal and breath sounds normal. No respiratory distress. She has no wheezes. She has no rales.  Abdominal: Soft. Bowel sounds are normal. She exhibits no distension. There is no tenderness. There is no rebound.  Musculoskeletal: Normal range of motion.  Neurological: She is alert and oriented to person, place, and time.  Intact symmetric cranial nerves. No pronator drift. No leg drift. No visual field loss. Reports decreased sensation through the entirety of her right upper, and right lower extremity  Skin: Skin is warm and dry. No rash noted.  Psychiatric: She has a normal mood and affect. Her behavior is normal.    ED Course  Procedures (including critical care time) Labs Review Labs Reviewed  CBC - Abnormal; Notable for the following:    RBC 5.39 (*)    Hemoglobin 15.3 (*)    All other components within normal limits  COMPREHENSIVE METABOLIC PANEL - Abnormal; Notable for the following:    Potassium 3.4 (*)     Chloride 99 (*)    Glucose, Bld 154 (*)    BUN 23 (*)    Total Bilirubin 0.2 (*)    GFR calc non Af Amer 56 (*)    All other components within normal limits  CBG MONITORING, ED - Abnormal; Notable for the following:    Glucose-Capillary 166 (*)    All other components within normal limits  I-STAT CHEM 8, ED - Abnormal; Notable for the following:    Potassium 3.3 (*)    Chloride 98 (*)    BUN 27 (*)    Glucose, Bld 144 (*)    Hemoglobin 16.0 (*)    HCT 47.0 (*)    All other components within normal limits  PROTIME-INR  APTT  DIFFERENTIAL  I-STAT TROPOININ, ED    Imaging Review Ct Head Wo Contrast  06/21/2015  CLINICAL DATA:  Code stroke. Sudden onset of right arm numbness and weakness. EXAM: CT HEAD WITHOUT CONTRAST TECHNIQUE: Contiguous axial images were obtained from the base of the skull through the vertex without intravenous contrast.  COMPARISON:  Head CT and brain MRI November 2015 FINDINGS: Small remote lacunar infarct in the posterior limb of the left internal capsule. No CT findings of acute ischemia. Chronic small vessel ischemic change, stable from prior. No intracranial hemorrhage, mass effect, or midline shift. No hydrocephalus. 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 The basilar cisterns are patent. No intracranial fluid collection. Calvarium is intact. Scattered mucosal thickening in the paranasal sinuses. Left mastoid air cell opacification with minimal progression from prior. IMPRESSION: Remote infarct in the posterior limb of the left internal capsule. No CT findings of acute ischemia or acute intracranial abnormality. These results were called by telephone at the time of interpretation on 06/21/2015 at 10:18 pm to Dr. Nicole Kindred , who verbally acknowledged these results. Electronically Signed   By: Jeb Levering M.D.   On: 06/21/2015 22:22   I have personally reviewed and evaluated these images and lab results  as part of my medical decision-making.   EKG Interpretation None      MDM   Final diagnoses:  CVA (cerebral infarction)   No acute abnormalities on exam CT. Patient has an NIH score of 1 for numbness. No indication for acute lytic therapy. Seen by Dr. Nicole Kindred of neurology. He recommends MRI, MRA, an admission for completion of workup     Tanna Furry, MD 06/22/15 [phone]

## 2015-06-23 DIAGNOSIS — G459 Transient cerebral ischemic attack, unspecified: Principal | ICD-10-CM

## 2015-06-23 DIAGNOSIS — I10 Essential (primary) hypertension: Secondary | ICD-10-CM | POA: Diagnosis not present

## 2015-06-23 LAB — BASIC METABOLIC PANEL
ANION GAP: 8 (ref 5–15)
BUN: 16 mg/dL (ref 6–20)
CHLORIDE: 107 mmol/L (ref 101–111)
CO2: 27 mmol/L (ref 22–32)
Calcium: 8.7 mg/dL — ABNORMAL LOW (ref 8.9–10.3)
Creatinine, Ser: 0.8 mg/dL (ref 0.44–1.00)
GFR calc Af Amer: >60 mL/min (ref >60)
GFR calc non Af Amer: >60 mL/min (ref >60)
GLUCOSE: 126 mg/dL — AB (ref 65–99)
POTASSIUM: 3.5 mmol/L (ref 3.5–5.1)
Sodium: 142 mmol/L (ref 135–145)

## 2015-06-23 LAB — GLUCOSE, CAPILLARY
GLUCOSE-CAPILLARY: 120 mg/dL — AB (ref 65–99)
GLUCOSE-CAPILLARY: 140 mg/dL — AB (ref 65–99)
GLUCOSE-CAPILLARY: 129 mg/dL — AB (ref 65–99)
Glucose-Capillary: 105 mg/dL — ABNORMAL HIGH (ref 65–99)

## 2015-06-23 LAB — HEMOGLOBIN A1C
HEMOGLOBIN A1C: 7 % — AB (ref 4.8–5.6)
Mean Plasma Glucose: 154 mg/dL

## 2015-06-23 MED ORDER — CLOPIDOGREL BISULFATE 75 MG PO TABS: 75.0000 mg | ORAL_TABLET | Freq: Every day | ORAL | Status: DC

## 2015-06-23 MED ORDER — INSULIN ASPART 100 UNIT/ML ~~LOC~~ SOLN
0.0000 [IU] | Freq: Three times a day (TID) | SUBCUTANEOUS | Status: DC
  Administered 2015-06-23: 1 [IU] via SUBCUTANEOUS

## 2015-06-23 MED ORDER — CYCLOBENZAPRINE HCL 10 MG PO TABS: 10.0000 mg | ORAL_TABLET | Freq: Three times a day (TID) | ORAL | Status: DC

## 2015-06-23 NOTE — Progress Notes (Signed)
SLP Cancellation Note  Patient Details Name: SHANYCE KORSMO MRN: [id number] DOB: Jul 07, 1945   Cancelled treatment:       Reason Eval/Treat Not Completed: SLP screened, no needs identified, will sign off   Dannial Monarch 06/23/2015, 10:55 AM   Sonia Baller, [place], [place] 06/23/2015 10:55 AM Phone: [phone] Fax: [phone]

## 2015-06-23 NOTE — Progress Notes (Signed)
Patient is discharged from room 5M19 at this time. Alert and in stable condition. EMMI teaching done. Instructions read to patient and understanding verbalized. Left unit via wheelchair with all belongings and husband at side.

## 2015-06-23 NOTE — Care Management Note (Signed)
Case Management Note  Patient Details  Name: [name] MRN: [id number] Date of Birth: [birth year]  Subjective/Objective:                    Action/Plan: Patient discharging home with self care. No f/u per PT/OT. No further needs per CM.   Expected Discharge Date:                  Expected Discharge Plan:  Home/Self Care  In-House Referral:     Discharge planning Services     Post Acute Care Choice:    Choice offered to:     DME Arranged:    DME Agency:     HH Arranged:    [place] Agency:     Status of Service:  Completed, signed off  Medicare Important Message Given:    Date Medicare IM Given:    Medicare IM give by:    Date Additional Medicare IM Given:    Additional Medicare Important Message give by:     If discussed at [place] of Stay Meetings, dates discussed:    Additional Comments:  Pollie Friar, RN 06/23/2015, 1:08 PM

## 2015-06-23 NOTE — Evaluation (Signed)
Occupational Therapy Evaluation Patient Details Name: [name] MRN: [id number] DOB: [birth year] Today's Date: 06/23/2015    History of Present Illness Pt is a 70 y.o. female history of hypertension, hyperlipidemia, diabetes mellitus and previous stroke and November 2015, presenting with recurrent acute numbness involving right extremities.   Clinical Impression   Pt reports she was independent with ADLs and mobility PTA. Currently pt is overall mod I with all ADLs and functional mobility. Pt reports she has slight numbness on dorsal aspect of R forearm and upper arm but it has been present since prior CVA ~1 year ago. Pt reports she feels all symptoms have resolved and she has returned to baseline level of function. Pt planning to d/c home with 24/7 supervision from family. No acute OT needs identified; signing off at this time. Thank you for this referral.     Follow Up Recommendations  No OT follow up;Supervision - Intermittent    Equipment Recommendations  None recommended by OT    Recommendations for Other Services       Precautions / Restrictions Precautions Precautions: None Restrictions Weight Bearing Restrictions: No      Mobility Bed Mobility Overal bed mobility: Independent                Transfers Overall transfer level: Modified independent Equipment used: None                  Balance Overall balance assessment: No apparent balance deficits (not formally assessed)                                          ADL Overall ADL's : At baseline;Modified independent                                       General ADL Comments: Pts husband present for OT eval. Pt reports she feels like her symptoms have resolved and she has returned to baseline level of function. Pt currenlty at a mod I level with all ADLs and functional mobility. Educated pt on safety precautions with tub transfer; hold on to wall or have husband  supervise.     Vision     Perception     Praxis      Pertinent Vitals/Pain Pain Assessment: No/denies pain     Hand Dominance Right   Extremity/Trunk Assessment Upper Extremity Assessment Upper Extremity Assessment: RUE deficits/detail RUE Deficits / Details: AROM/MMT overall WFL. Pt reports slight numbness on dorsal aspect of R forearm and upper arm but states that was present at baseline.   Lower Extremity Assessment Lower Extremity Assessment: Overall WFL for tasks assessed   Cervical / Trunk Assessment Cervical / Trunk Assessment: Normal   Communication Communication Communication: No difficulties   Cognition Arousal/Alertness: Awake/alert Behavior During Therapy: WFL for tasks assessed/performed Overall Cognitive Status: Within Functional Limits for tasks assessed                     General Comments       Exercises       Shoulder Instructions      Home Living Family/patient expects to be discharged to:: Private residence Living Arrangements: Spouse/significant other Available Help at Discharge: Family;Available 24 hours/day Type of Home: House Home Access: Stairs to enter  Home Layout: One level     Bathroom Shower/Tub: Tub/shower unit;Walk-in shower (Pt typically uses tub shower) Shower/tub characteristics: Architectural technologist: Handicapped height     Home Equipment: None          Prior Functioning/Environment Level of Independence: Independent             OT Diagnosis: Other (comment) (R UE sensory deficits)   OT Problem List:     OT Treatment/Interventions:      OT Goals(Current goals can be found in the care plan section) Acute Rehab OT Goals OT Goal Formulation: All assessment and education complete, DC therapy  OT Frequency:     Barriers to D/C:            Co-evaluation              End of Session Equipment Utilized During Treatment: Gait belt  Activity Tolerance: Patient tolerated treatment  well Patient left: in chair;with call bell/phone within reach;with family/visitor present   Time: 0817-0828 OT Time Calculation (min): 11 min Charges:  OT General Charges $OT Visit: 1 Procedure OT Evaluation $OT Eval Low Complexity: 1 Procedure G-Codes: OT G-codes **NOT FOR INPATIENT CLASS** Functional Assessment Tool Used: Clinical judgement Functional Limitation: Self care Self Care Current Status [id number] 0 percent impaired, limited or restricted Self Care Goal Status [id number]): 0 percent impaired, limited or restricted Self Care Discharge Status [id number]): 0 percent impaired, limited or restricted   Binnie Kand M.S., OTR/L Pager: [phone]  06/23/2015, 8:38 AM

## 2015-06-23 NOTE — Progress Notes (Signed)
PT Cancellation Note  Patient Details Name: [name] MRN: [id number] DOB: 1946-04-27   Cancelled Treatment:    Reason Eval/Treat Not Completed: PT screened, no needs identified, will sign off (Pt is up with independent transfers and gait).  Reminded pt if she feels something is needed then PT will return.   Ramond Dial 06/23/2015, 12:28 PM   Mee Hives, PT MS Acute Rehab Dept. Number: [facility] [id number] and [place] [phone]

## 2015-06-23 NOTE — Progress Notes (Signed)
The stroke team will sign off at this time. Please call if we can be of further service. Neurology follow-up with Dr. Leonie Man in one month. Continue Plavix secondary stroke prevention.  Mikey Bussing PA-C [facility] Pager [phone] 06/23/2015, 2:02 PM

## 2015-06-23 NOTE — Discharge Summary (Addendum)
Physician Discharge Summary  [name] [id number] DOB: Aug 07, 1945 DOA: 06/21/2015  PCP: Vidal Schwalbe, MD  Admit date: 06/21/2015 Discharge date: 06/23/2015  Time spent: 25* minutes  Recommendations for Outpatient Follow-up:  1. Follow up Neurology in 2 months   Discharge Diagnoses:  Active Problems:   TIA (transient ischemic attack)   Hypertension   Type 2 diabetes, diet controlled (HCC)   Essential hypertension   CVA (cerebral infarction)   Hypokalemia   Right arm numbness   Discharge Condition: Stable  Diet recommendation: Heart healthy diet  Filed Weights   06/22/15 0200  Weight: 64.955 kg (143 lb 3.2 oz)    History of present illness:  70 year old female presented with right arm numbness denies any slurred speech no droop strength appear to be intact, this was associated with a headache, no chest pain patient was brought to emergency department by husband evaluated and rash and cold stroke was called.Patient has history of prior CVA she is on aspirin 325 a day.   Hospital Course:  1. Right arm numbness- MRI brain is negative for acute stroke. MRA brain showed short segment moderate stenosis in the mid left M1 segment, short segment moderate stenosis within the proximal left P2 and proximal P3 segments. Neurology saw the patient and change aspirin to Plavix. Marland Kitchen 2-D echo showed no colic source of emboli and carotid Doppler were negative for significant lesion. Patient will follow up with neurology in 2 months. 2. ? Cervical spine DJD- patient has a history of cervical spine disease, her last MRI of cervical spine from 2001 showed broad-based central disc protrusion C6-7 without nerve root cutoff or spinal stenosis. 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 3. Hypertension- blood pressure is stable, continue Cardizem. 4. Diabetes mellitus- continue metformin 5. Hyperlipidemia- continue simvastatin, Zetia  Procedures:  2-D echo  Carotid  duplex  Consultations:  Neurology  Discharge Exam: Filed Vitals:   06/23/15 0559 06/23/15 1000  BP: 139/68 145/68  Pulse: 81 68  Temp: 97.7 F (36.5 C) 98.4 F (36.9 C)  Resp: 17 18    General: Appears in no acute distress Cardiovascular: S1-S2 regular Respiratory: Clear to auscultation bilaterally  Discharge Instructions   Discharge Instructions    Diet - low sodium heart healthy    Complete by:  As directed      Increase activity slowly    Complete by:  As directed           Current Discharge Medication List    START taking these medications   Details  clopidogrel (PLAVIX) 75 MG tablet Take 1 tablet (75 mg total) by mouth daily. Qty: 30 tablet, Refills: 3      CONTINUE these medications which have NOT CHANGED   Details  Cholecalciferol (VITAMIN D3) 50000 UNITS CAPS Take 1 capsule by mouth once a week.    diltiazem (DILT-XR) 240 MG 24 hr capsule Take 240 mg by mouth every morning.    ezetimibe (ZETIA) 10 MG tablet Take 10 mg by mouth every morning.    fluticasone (FLONASE) 50 MCG/ACT nasal spray Place 1 spray into both nostrils as needed for allergies.  Refills: 0    lisinopril-hydrochlorothiazide (PRINZIDE,ZESTORETIC) 20-12.5 MG per tablet Take 1 tablet by mouth every morning.    metFORMIN (GLUCOPHAGE-XR) 500 MG 24 hr tablet TAKE 2 TABLETS BY MOUTH WITH EVENING MEAL Refills: 5    simvastatin (ZOCOR) 20 MG tablet Take 1 tablet (20 mg total) by mouth daily at 6 PM. Qty: 30 tablet, Refills: 0  Flexeril 10 mg by mouth 3 times a day     STOP taking these medications     aspirin 325 MG EC tablet            Allergies  Allergen Reactions  . Vicodin [Hydrocodone-Acetaminophen] Swelling    Pt tolerates plain Tylenol  . Crestor [Rosuvastatin Calcium] Other (See Comments)    MYALGIA  . Lipitor [Atorvastatin Calcium] Other (See Comments)    MYALGIA   Follow-up Information    Follow up with Vidal Schwalbe, MD In 2 weeks.   Specialty:  Family  Medicine   Contact information:   [place] [id number] [phone]       Follow up with SETHI,PRAMOD, MD In 2 months.   Specialties:  Neurology, Radiology   Contact information:   [address] [place] [address] [phone]        The results of significant diagnostics from this hospitalization (including imaging, microbiology, ancillary and laboratory) are listed below for reference.    Significant Diagnostic Studies: Dg Chest 2 View  06/22/2015  CLINICAL DATA:  TIA symptoms, history of hypertension and diabetes, former smoker, no chest complaints. EXAM: CHEST  2 VIEW COMPARISON:  Chest x-ray of March 02, 2014 FINDINGS: The lungs are mildly hyperinflated and clear. The heart and pulmonary vascularity are normal. The mediastinum is normal in width. There is no pleural effusion. The bony thorax exhibits no acute abnormality. IMPRESSION: Hyperinflation consistent with COPD. There is no pneumonia nor other acute cardiopulmonary abnormality. Electronically Signed   By: David  Martinique M.D.   On: 06/22/2015 07:51   Ct Head Wo Contrast  06/21/2015  CLINICAL DATA:  Code stroke. Sudden onset of right arm numbness and weakness. EXAM: CT HEAD WITHOUT CONTRAST TECHNIQUE: Contiguous axial images were obtained from the base of the skull through the vertex without intravenous contrast. COMPARISON:  Head CT and brain MRI November 2015 FINDINGS: Small remote lacunar infarct in the posterior limb of the left internal capsule. No CT findings of acute ischemia. Chronic small vessel ischemic change, stable from prior. No intracranial hemorrhage, mass effect, or midline shift. No hydrocephalus. 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 The basilar cisterns are patent. No intracranial fluid collection. Calvarium is intact. Scattered mucosal thickening in the paranasal sinuses. Left mastoid air cell  opacification with minimal progression from prior. IMPRESSION: Remote infarct in the posterior limb of the left internal capsule. No CT findings of acute ischemia or acute intracranial abnormality. These results were called by telephone at the time of interpretation on 06/21/2015 at 10:18 pm to Dr. Nicole Kindred , who verbally acknowledged these results. Electronically Signed   By: Jeb Levering M.D.   On: 06/21/2015 22:22   Mr Jodene Nam Head Wo Contrast  06/22/2015  CLINICAL DATA:  Initial evaluation for recurrent right-sided numbness. EXAM: MRI HEAD WITHOUT CONTRAST MRA HEAD WITHOUT CONTRAST TECHNIQUE: Multiplanar, multiecho pulse sequences of the brain and surrounding structures were obtained without intravenous contrast. Angiographic images of the head were obtained using MRA technique without contrast. COMPARISON:  Prior CT from earlier the same day. FINDINGS: MRI HEAD FINDINGS Cerebral volume within normal limits for patient age. Scattered patchy T2/FLAIR hyperintensity within the periventricular and deep white matter both cerebral hemispheres most consistent with chronic small vessel ischemic disease, mild in nature. Small remote lacunar infarct within the left thalamus/ left internal capsule. No other areas of chronic infarction. No abnormal foci of restricted diffusion  to suggest acute intracranial infarct. Gray-white matter differentiation maintained. Major intracranial vascular flow voids are preserved. No acute or chronic intracranial hemorrhage. No mass lesion, midline shift, or mass effect. No hydrocephalus. No extra-axial fluid collection. Craniocervical junction within normal limits. Mega cisterna magna noted. Pituitary gland within normal limits. No acute abnormality about the orbits. Sequela prior bilateral lens extraction. Mild scattered mucosal thickening within ethmoidal air cells. Paranasal sinuses are otherwise clear. Left mastoid effusion noted. Minimal scattered opacity within the right mastoid air  cells. Bone marrow signal intensity within normal limits. No scalp soft tissue abnormality. MRA HEAD FINDINGS ANTERIOR CIRCULATION: Visualized distal cervical segments of the internal carotid arteries are patent with antegrade flow. Petrous segments widely patent. Focal mild to moderate stenosis within the distal cavernous left ICA (series 5, image 36). Cavernous and supraclinoid ICAs otherwise patent without significant stenosis. A1 segments patent. Right A1 segment hypoplastic. Anterior communicating artery normal. Anterior cerebral arteries well opacified. Right M1 segment widely patent without stenosis or occlusion. Right MCA bifurcation normal. Distal right MCA branches well opacified. There is a focal short-segment moderate stenosis within the proximal -mid left M1 segment (series 502, image 9). Left M1 segment otherwise widely patent. MCA bifurcation on the left within normal limits. Distal left MCA branches opacified and fairly symmetric with the right. Vertebral arteries patent to the vertebrobasilar junction. Posterior inferior cerebral arteries patent bilaterally. Basilar artery widely patent. Dominant left anterior inferior cerebral artery. Superior cerebral arteries well opacified. Both posterior sore wires arise in the basilar artery. Superimposed short-segment moderate stenosis within mid left P2 segment (Series 505, image 9). Additional moderate stenosis more distally within proximal left P3 segment (series 505, image 4). No significant stenosis within the right PCA, although probable distal small vessel irregularity within its distal branches. Small left posterior communicating artery noted. No aneurysm or vascular malformation. IMPRESSION: MRI HEAD IMPRESSION: 1. No acute intracranial infarct or other abnormality identified. 2. Remote lacunar infarct within the left thalamus/posterior limb of the left internal capsule. 3. Mild chronic small vessel ischemic disease. MRA HEAD IMPRESSION: 1. No large  or proximal arterial branch occlusion within the intracranial circulation. 2. Short-segment moderate stenosis within the mid left M1 segment. 3. Focal mild to moderate stenosis within the distal cavernous left ICA. 4. Short-segment moderate stenoses within the proximal left P2 and proximal P3 segments. Electronically Signed   By: Jeannine Boga M.D.   On: 06/22/2015 02:01   Mr Brain Wo Contrast  06/22/2015  CLINICAL DATA:  Initial evaluation for recurrent right-sided numbness. EXAM: MRI HEAD WITHOUT CONTRAST MRA HEAD WITHOUT CONTRAST TECHNIQUE: Multiplanar, multiecho pulse sequences of the brain and surrounding structures were obtained without intravenous contrast. Angiographic images of the head were obtained using MRA technique without contrast. COMPARISON:  Prior CT from earlier the same day. FINDINGS: MRI HEAD FINDINGS Cerebral volume within normal limits for patient age. Scattered patchy T2/FLAIR hyperintensity within the periventricular and deep white matter both cerebral hemispheres most consistent with chronic small vessel ischemic disease, mild in nature. Small remote lacunar infarct within the left thalamus/ left internal capsule. No other areas of chronic infarction. No abnormal foci of restricted diffusion to suggest acute intracranial infarct. Gray-white matter differentiation maintained. Major intracranial vascular flow voids are preserved. No acute or chronic intracranial hemorrhage. No mass lesion, midline shift, or mass effect. No hydrocephalus. No extra-axial fluid collection. Craniocervical junction within normal limits. Mega cisterna magna noted. Pituitary gland within normal limits. No acute abnormality about the orbits. Sequela prior bilateral lens  extraction. Mild scattered mucosal thickening within ethmoidal air cells. Paranasal sinuses are otherwise clear. Left mastoid effusion noted. Minimal scattered opacity within the right mastoid air cells. Bone marrow signal intensity within  normal limits. No scalp soft tissue abnormality. MRA HEAD FINDINGS ANTERIOR CIRCULATION: Visualized distal cervical segments of the internal carotid arteries are patent with antegrade flow. Petrous segments widely patent. Focal mild to moderate stenosis within the distal cavernous left ICA (series 5, image 36). Cavernous and supraclinoid ICAs otherwise patent without significant stenosis. A1 segments patent. Right A1 segment hypoplastic. Anterior communicating artery normal. Anterior cerebral arteries well opacified. Right M1 segment widely patent without stenosis or occlusion. Right MCA bifurcation normal. Distal right MCA branches well opacified. There is a focal short-segment moderate stenosis within the proximal -mid left M1 segment (series 502, image 9). Left M1 segment otherwise widely patent. MCA bifurcation on the left within normal limits. Distal left MCA branches opacified and fairly symmetric with the right. Vertebral arteries patent to the vertebrobasilar junction. Posterior inferior cerebral arteries patent bilaterally. Basilar artery widely patent. Dominant left anterior inferior cerebral artery. Superior cerebral arteries well opacified. Both posterior sore wires arise in the basilar artery. Superimposed short-segment moderate stenosis within mid left P2 segment (Series 505, image 9). Additional moderate stenosis more distally within proximal left P3 segment (series 505, image 4). No significant stenosis within the right PCA, although probable distal small vessel irregularity within its distal branches. Small left posterior communicating artery noted. No aneurysm or vascular malformation. IMPRESSION: MRI HEAD IMPRESSION: 1. No acute intracranial infarct or other abnormality identified. 2. Remote lacunar infarct within the left thalamus/posterior limb of the left internal capsule. 3. Mild chronic small vessel ischemic disease. MRA HEAD IMPRESSION: 1. No large or proximal arterial branch occlusion  within the intracranial circulation. 2. Short-segment moderate stenosis within the mid left M1 segment. 3. Focal mild to moderate stenosis within the distal cavernous left ICA. 4. Short-segment moderate stenoses within the proximal left P2 and proximal P3 segments. Electronically Signed   By: Jeannine Boga M.D.   On: 06/22/2015 02:01   Mr Cervical Spine Wo Contrast  06/22/2015  CLINICAL DATA:  Initial evaluation for right arm numbness. EXAM: MRI CERVICAL SPINE WITHOUT CONTRAST TECHNIQUE: Multiplanar, multisequence MR imaging of the cervical spine was performed. No intravenous contrast was administered. COMPARISON:  Prior MRI from 04/30/2008. FINDINGS: Visualized portions of the brain and posterior fossa demonstrate a normal appearance with normal signal intensity. Craniocervical junction widely patent. Vertebral bodies are normally aligned with preservation of the normal cervical lordosis. Vertebral body heights well maintained. No fracture or malalignment. Signal intensity within the vertebral body bone marrow is normal. No marrow edema. Patient has undergone ACDF at the C6-7 level since the prior MRI. Hardware appears grossly aligned without abnormality. Signal intensity within the cervical spinal cord is normal. Paraspinous soft tissues demonstrate no acute abnormality. No prevertebral soft tissue swelling. Normal intravascular flow voids present within the vertebral arteries bilaterally. C2-C3: Negative. C3-C4: Broad-based posterior disc protrusion, extending into the neural foramina bilaterally. No significant foraminal encroachment or stenosis. Mild superimposed bilateral uncovertebral spurring. C4-C5: Bilateral uncovertebral spurring with diffuse disc bulge. Bulging disc partially effaces the ventral thecal sac without significant canal stenosis. Left-sided facet arthrosis. Mild left foraminal narrowing. No significant right foraminal stenosis. C5-C6: Anterior endplate osteophytic spurring, similar  to prior. Minimal annular disc bulge. Mild left-sided facet arthrosis. No significant canal or foraminal stenosis. C6-C7: Prior ACDF. No significant stenosis. Persistent bilateral uncovertebral spurring is similar relative to  prior study. There is resultant mild to moderate bilateral foraminal narrowing, similar. C7-T1: Minimal annular disc bulge. No significant canal stenosis. Mild left foraminal narrowing related to uncovertebral spurring. Visualized upper thoracic spine demonstrates mild annular disc bulge at T2-3 without significant stenosis. Remainder the upper thoracic spine is unremarkable. IMPRESSION: 1. Postoperative changes from prior ACDF at [id number] without complication. Residual mild to moderate bilateral foraminal narrowing at this level is similar relative to previous study. 2. Additional fairly mild multilevel degenerative spondylolysis as above. No findings to explain the patient's right upper extremity symptoms Electronically Signed   By: Jeannine Boga M.D.   On: 06/22/2015 22:51    Microbiology: Recent Results (from the past 240 hour(s))  MRSA PCR Screening     Status: None   Collection Time: 06/22/15  4:45 AM  Result Value Ref Range Status   MRSA by PCR NEGATIVE NEGATIVE Final    Comment:        The GeneXpert MRSA Assay (FDA approved for NASAL specimens only), is one component of a comprehensive MRSA colonization surveillance program. It is not intended to diagnose MRSA infection nor to guide or monitor treatment for MRSA infections.      Labs: Basic Metabolic Panel:  Recent Labs Lab 06/21/15 2200 06/21/15 2212 06/23/15 0325  NA 142 143 142  K 3.4* 3.3* 3.5  CL 99* 98* 107  CO2 29  --  27  GLUCOSE 154* 144* 126*  BUN 23* 27* 16  CREATININE 0.99 1.00 0.80  CALCIUM 9.7  --  8.7*   Liver Function Tests:  Recent Labs Lab 06/21/15 2200  AST 22  ALT 22  ALKPHOS 72  BILITOT 0.2*  PROT 6.9  ALBUMIN 4.0   No results for input(s): LIPASE, AMYLASE in the  last 168 hours. No results for input(s): AMMONIA in the last 168 hours. CBC:  Recent Labs Lab 06/21/15 2200 06/21/15 2212  WBC 9.6  --   NEUTROABS 5.4  --   HGB 15.3* 16.0*  HCT 45.7 47.0*  MCV 84.8  --   PLT 281  --    Cardiac Enzymes:  Recent Labs Lab 06/22/15 0402  TROPONINI <0.03    CBG:  Recent Labs Lab 06/22/15 2022 06/23/15 0011 06/23/15 0449 06/23/15 0753 06/23/15 1157  GLUCAP 180* 105* 120* 140* 129*       Signed:  Eleonore Chiquito S MD.  [facility] 06/23/2015, 12:41 PM

## 2015-06-26 DIAGNOSIS — R202 Paresthesia of skin: Secondary | ICD-10-CM | POA: Diagnosis not present

## 2015-07-09 DIAGNOSIS — T7840XA Allergy, unspecified, initial encounter: Secondary | ICD-10-CM | POA: Diagnosis not present

## 2015-07-10 ENCOUNTER — Telehealth: Payer: Self-pay | Admitting: Nurse Practitioner

## 2015-07-10 DIAGNOSIS — E1121 Type 2 diabetes mellitus with diabetic nephropathy: Secondary | ICD-10-CM | POA: Diagnosis not present

## 2015-07-10 DIAGNOSIS — Z7984 Long term (current) use of oral hypoglycemic drugs: Secondary | ICD-10-CM | POA: Diagnosis not present

## 2015-07-10 DIAGNOSIS — L508 Other urticaria: Secondary | ICD-10-CM | POA: Diagnosis not present

## 2015-07-10 DIAGNOSIS — I639 Cerebral infarction, unspecified: Secondary | ICD-10-CM | POA: Diagnosis not present

## 2015-07-10 DIAGNOSIS — G459 Transient cerebral ischemic attack, unspecified: Secondary | ICD-10-CM | POA: Diagnosis not present

## 2015-07-10 NOTE — Telephone Encounter (Signed)
Pt called said she was started on plavix 06/23/15 while in the hospital for surgery and also stroke work-up. Sts she been having itching of back and head since starting it but yesterday she began having swelling of lt wrist, up the arm with itching of hands, wrist and head and around the navel. Pt went to walk-in clinic at [facility] 07/09/15 and was given a shot of prednisone. She took benadryl every 4 hrs also and said today the itching is a little better. She has not taken plavix since yesterday morning. Pt's concerns are  that she will have a stroke and not know the symptoms or she will go to the hospital thinking she is having a stroke and it will be nothing. She is very confused and anxious about when to go to the hospital. She said the MRI that was done while she was in the hospital in February did not show she had a stoke so she is confused why she was instructed to discontinue aspirin and start plavix. Pt is seeing PCP today and will discuss all her concerns. She will call the hospital to inquire about starting of plavix. Pt may call back if she does not feel she has all the answers to request appt to be seen regarding stroke symptoms. FYI at this time

## 2015-07-13 DIAGNOSIS — Z7984 Long term (current) use of oral hypoglycemic drugs: Secondary | ICD-10-CM | POA: Diagnosis not present

## 2015-07-13 DIAGNOSIS — E11319 Type 2 diabetes mellitus with unspecified diabetic retinopathy without macular edema: Secondary | ICD-10-CM | POA: Diagnosis not present

## 2015-07-13 DIAGNOSIS — G459 Transient cerebral ischemic attack, unspecified: Secondary | ICD-10-CM | POA: Diagnosis not present

## 2015-07-18 ENCOUNTER — Encounter: Payer: Self-pay | Admitting: Nurse Practitioner

## 2015-07-18 ENCOUNTER — Ambulatory Visit (INDEPENDENT_AMBULATORY_CARE_PROVIDER_SITE_OTHER): Payer: PPO | Admitting: Nurse Practitioner

## 2015-07-18 VITALS — BP 140/72 | HR 76 | Resp 18 | Ht 63.0 in | Wt 148.2 lb

## 2015-07-18 DIAGNOSIS — R200 Anesthesia of skin: Secondary | ICD-10-CM

## 2015-07-18 DIAGNOSIS — G458 Other transient cerebral ischemic attacks and related syndromes: Secondary | ICD-10-CM

## 2015-07-18 DIAGNOSIS — R202 Paresthesia of skin: Secondary | ICD-10-CM

## 2015-07-18 DIAGNOSIS — I10 Essential (primary) hypertension: Secondary | ICD-10-CM | POA: Diagnosis not present

## 2015-07-18 DIAGNOSIS — E785 Hyperlipidemia, unspecified: Secondary | ICD-10-CM | POA: Diagnosis not present

## 2015-07-18 NOTE — Progress Notes (Signed)
I reviewed above note and agree with the assessment and plan.  Rosalin Hawking, MD PhD Stroke Neurology 07/18/2015 5:44 PM

## 2015-07-18 NOTE — Patient Instructions (Signed)
Continue aspirin for secondary stroke prevention 325mg   Strict control of blood pressure with goal 130/90 or below today's reading 140/72,  Hemoglobin A1c 6.5 or less, last 7.0 Total cholesterol less than 200, last 134 LDL cholesterol less than 80, last 81 Continue walking everyday for exercise and overall health and well-being Healthy diet and increased fiber Will get EMG N/C F/U in 3 months

## 2015-07-18 NOTE — Progress Notes (Signed)
[facility]  PATIENT: [name] DOB: 12-01-1945   REASON FOR VISIT: Hospital follow-up after TIA, history of hypertension hyperlipidemia diabetes HISTORY FROM: Patient    HISTORY OF PRESENT ILLNESS: [name] 70 year old female returns for hospital follow-up with history of  hypertension, hyperlipidemia, diabetes mellitus and previous stroke and November 2015, presenting with recurrent acute numbness involving right extremities. . She did not develop numbness involving her face and has not noticed speech changes. She's been taking aspirin 325 mg per day. CT scan of her head showed no acute changes. Old infarction involving posterior limb of left internal capsule was noted. NIH stroke score was 1 for numbness. Patient was admitted. MRI of the brain was negative for acute stroke. MRA of the head no large arterial branch occlusion, smooth mild to moderate stenosis within the distal cavernous left ICA. MRI of the cervical spine shows cervical spondylosis. She was on aspirin 325 and this was switched to Plavix. Once she got out of the hospital she developed a rash on her back arms and legs. Plavix was the only medication that hasn't changed. She saw her primary care who gave her prednisone. The patient doubled her aspirin dose. She continues to complain of right arm numbness and has a history of neck pain . She returns for reevaluation     UPDATE 03/01/2015 [name], 70 year old female returns for follow-up. 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 She remains on aspirin 325 daily without further stroke or TIA symptoms. Her routine labs are followed by her primary care. She claims her CBG runs between 130 and 154. She has a history of hypertension and her reading today is elevated in the office 147/86. She continues to walk 3-4 miles several times a week. She continues to complain of numbness in the right upper  extremity which is the residual from her previous stroke. She returns for reevaluation. She is due for a carotid Doppler.   REVIEW OF SYSTEMS: Full 14 system review of systems performed and notable only for those listed, all others are neg:  Constitutional: neg  Cardiovascular: neg Ear/Nose/Throat: neg  Skin: neg Eyes: neg Respiratory: neg Gastroitestinal: neg  Hematology/Lymphatic: neg  Endocrine: neg Musculoskeletal: Neck pain  Allergy/Immunology: neg Neurological: Numbness of the right arm Psychiatric: neg Sleep : neg   ALLERGIES: Allergies  Allergen Reactions  . Vicodin [Hydrocodone-Acetaminophen] Swelling    Pt tolerates plain Tylenol  . Crestor [Rosuvastatin Calcium] Other (See Comments)    MYALGIA  . Lipitor [Atorvastatin Calcium] Other (See Comments)    MYALGIA    HOME MEDICATIONS: Outpatient Prescriptions Prior to Visit  Medication Sig Dispense Refill  . Cholecalciferol (VITAMIN D3) 50000 UNITS CAPS Take 1 capsule by mouth once a week.    . diltiazem (DILT-XR) 240 MG 24 hr capsule Take 240 mg by mouth every morning.    . ezetimibe (ZETIA) 10 MG tablet Take 10 mg by mouth every morning.    . fluticasone (FLONASE) 50 MCG/ACT nasal spray Place 1 spray into both nostrils as needed for allergies.   0  . lisinopril-hydrochlorothiazide (PRINZIDE,ZESTORETIC) 20-12.5 MG per tablet Take 1 tablet by mouth every morning.    . metFORMIN (GLUCOPHAGE-XR) 500 MG 24 hr tablet TAKE 2 TABLETS BY MOUTH WITH EVENING MEAL  5  . simvastatin (ZOCOR) 20 MG tablet Take 1 tablet (20 mg total) by mouth daily at 6 PM. 30 tablet 0  . clopidogrel (PLAVIX) 75 MG  tablet Take 1 tablet (75 mg total) by mouth daily. (Patient not taking: Reported on 07/18/2015) 30 tablet 3  . cyclobenzaprine (FLEXERIL) 10 MG tablet Take 1 tablet (10 mg total) by mouth 3 (three) times daily. (Patient not taking: Reported on 07/18/2015) 30 tablet 0   No facility-administered medications prior to visit.    PAST MEDICAL  HISTORY: Past Medical History  Diagnosis Date  . Hypertension   . Hyperlipidemia   . Vitamin D deficiency   . Type 2 diabetes, diet controlled ([place])   . Bleeding internal hemorrhoids   . Wears glasses     PAST SURGICAL HISTORY: Past Surgical History  Procedure Laterality Date  . Lumbar spine surgery  1970's  . Abdominal hysterectomy  1980's  . Laparoscopic cholecystectomy  1995  . Plantar fascia surgery Right 2003  . Anterior cervical decomp/discectomy fusion  05-19-2008    C6 -- C7  . Shoulder arthroscopy/  debridement labrum and rotator cuff/ bursectomy/ acromioplasty/  cal release/  excision distal clavical Bilateral right 09-15-2008/   left  10-17-2008  . Cataract extraction w/ intraocular lens  implant, bilateral  2011  . Hemorrhoid surgery N/A 03/02/2014    Procedure: HEMORRHOIDOPEXY;  Surgeon: Leighton Ruff, MD;  Location: [facility];  Service: General;  Laterality: N/A;    FAMILY HISTORY: Family History  Problem Relation Age of Onset  . Hypertension Father   . Stroke Father   . Hypertension Sister   . Heart disease Brother   . Hypertension Brother   . Heart disease Brother   . Heart disease Sister   . Heart disease Sister   . Cancer Sister     breast    SOCIAL HISTORY: Social History   Social History  . Marital Status: Married    Spouse Name: N/A  . Number of Children: N/A  . Years of Education: N/A   Occupational History  . Not on file.   Social History Main Topics  . Smoking status: Former Smoker -- 1.50 packs/day for 30 years    Types: Cigarettes    Quit date: 02/29/1992  . Smokeless tobacco: Never Used  . Alcohol Use: No  . Drug Use: No  . Sexual Activity: Not on file   Other Topics Concern  . Not on file   Social History Narrative     PHYSICAL EXAM  Filed Vitals:   07/18/15 1101  BP: 160/72  Pulse: 76  Resp: 18  Height: 5\' 3"  (1.6 m)  Weight: 148 lb 3.2 oz (67.223 kg)   Body mass index is 26.26  kg/(m^2). General: well developed, well nourished elderly lady, seated, in no evident distress Head: head normocephalic and atraumatic.  Neck: supple with no carotid or supraclavicular bruits Cardiovascular: regular rate and rhythm, no murmurs Musculoskeletal: no deformity Skin: no rash/petichiae Vascular: Normal pulses all extremities  Neurologic Exam Mental Status: Awake and fully alert. Oriented to place and time. Recent and remote memory intact. Attention span, concentration and fund of knowledge appropriate. Mood and affect appropriate.  Cranial Nerves: Fundoscopic exam not done. Pupils equal, briskly reactive to light. Extraocular movements full without nystagmus. Visual fields full to confrontation. Hearing intact. Facial sensation intact. Face, tongue, palate moves normally and symmetrically.  Motor: Normal bulk and tone. Normal strength in all tested extremity muscles. Sensory.: intact to touch ,.position and vibratory sensation. Subjective paresthesias RUE . Decreased pinprick to right arm Coordination: Rapid alternating movements normal in all extremities. Finger-to-nose and heel-to-shin performed accurately bilaterally. Gait and Station:  Arises from chair without difficulty. Stance is normal. Gait demonstrates normal stride length and balance . Able to heel, toe and tandem walk without difficulty.  Reflexes: 1+ and symmetric. Toes downgoing.    DIAGNOSTIC DATA (LABS, IMAGING, TESTING) - I reviewed patient records, labs, notes, testing and imaging myself where available.  Lab Results  Component Value Date   WBC 9.6 06/21/2015   HGB 16.0* 06/21/2015   HCT 47.0* 06/21/2015   MCV 84.8 06/21/2015   PLT 281 06/21/2015      Component Value Date/Time   NA 142 06/23/2015 0325   K 3.5 06/23/2015 0325   CL 107 06/23/2015 0325   CO2 27 06/23/2015 0325   GLUCOSE 126* 06/23/2015 0325   BUN 16 06/23/2015 0325   CREATININE 0.80 06/23/2015 0325   CALCIUM 8.7* 06/23/2015 0325    PROT 6.9 06/21/2015 2200   ALBUMIN 4.0 06/21/2015 2200   AST 22 06/21/2015 2200   ALT 22 06/21/2015 2200   ALKPHOS 72 06/21/2015 2200   BILITOT 0.2* 06/21/2015 2200   GFRNONAA >60 06/23/2015 0325   GFRAA >60 06/23/2015 0325   Lab Results  Component Value Date   CHOL 134 06/22/2015   HDL 42 06/22/2015   LDLCALC 81 06/22/2015   TRIG 57 06/22/2015   CHOLHDL 3.2 06/22/2015   Lab Results  Component Value Date   HGBA1C 7.0* 06/22/2015    ASSESSMENT AND PLAN 70 y.o. 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 Vascular risk factors of Hypertension; Hyperlipidemia; Type 2 diabetes, Admission  2/22/ 2017 for TIA. Aspirin was changed to Plavix at that time but the patient had a skin rash. The patient also complains of neck pain and continued numbness in the right arm The patient is a current patient of Dr. Leonie Man who is out of the office today . This note is sent to the work in doctor.   Control of risk factors is essential to prevent recurrent stroke Discussed with Dr. Erlinda Hong Continue aspirin for secondary stroke prevention 325mg   Strict control of blood pressure with goal 130/90 or below today's reading 140/72,  Hemoglobin A1c 6.5 or less, last 7.0 Total cholesterol less than 200, last 134 LDL cholesterol less than 80, last 81 Continue walking everyday for exercise and overall health and well-being Healthy diet and increased fiber Will get EMG N/C for right arm numbness, neck pain F/U in 3 months Dennie Bible, [facility], [facility], [facility] [address], [place], [place] [address] [phone]

## 2015-07-19 DIAGNOSIS — I10 Essential (primary) hypertension: Secondary | ICD-10-CM | POA: Diagnosis not present

## 2015-07-19 DIAGNOSIS — M5412 Radiculopathy, cervical region: Secondary | ICD-10-CM | POA: Diagnosis not present

## 2015-07-19 DIAGNOSIS — Z6825 Body mass index (BMI) 25.0-25.9, adult: Secondary | ICD-10-CM | POA: Diagnosis not present

## 2015-08-09 ENCOUNTER — Ambulatory Visit (INDEPENDENT_AMBULATORY_CARE_PROVIDER_SITE_OTHER): Payer: PPO | Admitting: Neurology

## 2015-08-09 ENCOUNTER — Encounter: Payer: Self-pay | Admitting: Neurology

## 2015-08-09 ENCOUNTER — Ambulatory Visit (INDEPENDENT_AMBULATORY_CARE_PROVIDER_SITE_OTHER): Payer: Self-pay | Admitting: Neurology

## 2015-08-09 DIAGNOSIS — R202 Paresthesia of skin: Secondary | ICD-10-CM | POA: Diagnosis not present

## 2015-08-09 DIAGNOSIS — R200 Anesthesia of skin: Secondary | ICD-10-CM

## 2015-08-09 NOTE — Procedures (Signed)
     HISTORY:  [name] is a 70 year old patient with a history of a stroke event associated with some right-sided numbness. The patient has had some residual numbness of the right arm, she has had some neck pain in the past, she is being evaluated for a possible cervical radiculopathy.  NERVE CONDUCTION STUDIES:  Nerve conduction studies were performed on both upper extremities. The distal motor latencies and motor amplitudes for the median and ulnar nerves were within normal limits. The F wave latencies and nerve conduction velocities for these nerves were also normal. The sensory latencies for the median and ulnar nerves were normal.   EMG STUDIES:  EMG study was performed on the right upper extremity:  The first dorsal interosseous muscle reveals 2 to 4 K units with full recruitment. No fibrillations or positive waves were noted. The abductor pollicis brevis muscle reveals 2 to 4 K units with full recruitment. No fibrillations or positive waves were noted. The extensor indicis proprius muscle reveals 1 to 3 K units with full recruitment. No fibrillations or positive waves were noted. The pronator teres muscle reveals 2 to 3 K units with full recruitment. No fibrillations or positive waves were noted. The biceps muscle reveals 1 to 2 K units with full recruitment. No fibrillations or positive waves were noted. The triceps muscle reveals 2 to 4 K units with full recruitment. No fibrillations or positive waves were noted. The anterior deltoid muscle reveals 2 to 3 K units with full recruitment. No fibrillations or positive waves were noted. The cervical paraspinal muscles were tested at 2 levels. No abnormalities of insertional activity were seen at either level tested. There was good relaxation.   IMPRESSION:  Nerve conduction studies done on both upper extremities were within normal limits. No evidence of a neuropathy is seen. EMG evaluation of the right upper extremity was  unremarkable, without evidence of an overlying cervical radiculopathy.  Jill Alexanders MD 08/09/2015 5:07 PM  [facility] [address] [place],  [address]  Phone [phone] Fax [phone]

## 2015-08-09 NOTE — Progress Notes (Signed)
Please refer to EMG and nerve conduction study procedure note. 

## 2015-08-10 ENCOUNTER — Telehealth: Payer: Self-pay | Admitting: Nurse Practitioner

## 2015-08-10 NOTE — Telephone Encounter (Signed)
-----   Message from Ward Givens, NP sent at 08/10/2015  7:56 AM EDT ----- Study was normal. Please call the patient.

## 2015-08-10 NOTE — Telephone Encounter (Signed)
Called and spoke to patient relayed study was normal. Patient understood.

## 2015-08-10 NOTE — Progress Notes (Signed)
Quick Note:  Called and spoke to patient relayed study was normal. Patient understood. ______

## 2015-08-14 DIAGNOSIS — Z1231 Encounter for screening mammogram for malignant neoplasm of breast: Secondary | ICD-10-CM | POA: Diagnosis not present

## 2015-08-14 DIAGNOSIS — Z803 Family history of malignant neoplasm of breast: Secondary | ICD-10-CM | POA: Diagnosis not present

## 2015-08-17 DIAGNOSIS — N6002 Solitary cyst of left breast: Secondary | ICD-10-CM | POA: Diagnosis not present

## 2015-08-17 DIAGNOSIS — N63 Unspecified lump in breast: Secondary | ICD-10-CM | POA: Diagnosis not present

## 2015-08-24 ENCOUNTER — Ambulatory Visit: Payer: Self-pay | Admitting: Nurse Practitioner

## 2015-08-29 ENCOUNTER — Ambulatory Visit: Payer: Self-pay | Admitting: Nurse Practitioner

## 2015-10-14 ENCOUNTER — Emergency Department (HOSPITAL_COMMUNITY)
Admission: EM | Admit: 2015-10-14 | Discharge: 2015-10-14 | Disposition: A | Discharge status: Home / Self Care | Payer: PPO | Attending: Emergency Medicine | Admitting: Emergency Medicine

## 2015-10-14 ENCOUNTER — Encounter (HOSPITAL_COMMUNITY): Payer: Self-pay | Admitting: Family Medicine

## 2015-10-14 DIAGNOSIS — I10 Essential (primary) hypertension: Secondary | ICD-10-CM | POA: Diagnosis not present

## 2015-10-14 DIAGNOSIS — S8002XA Contusion of left knee, initial encounter: Secondary | ICD-10-CM | POA: Insufficient documentation

## 2015-10-14 DIAGNOSIS — M25562 Pain in left knee: Secondary | ICD-10-CM | POA: Diagnosis not present

## 2015-10-14 DIAGNOSIS — Z7982 Long term (current) use of aspirin: Secondary | ICD-10-CM | POA: Insufficient documentation

## 2015-10-14 DIAGNOSIS — Z5321 Procedure and treatment not carried out due to patient leaving prior to being seen by health care provider: Secondary | ICD-10-CM | POA: Insufficient documentation

## 2015-10-14 DIAGNOSIS — E119 Type 2 diabetes mellitus without complications: Secondary | ICD-10-CM | POA: Diagnosis not present

## 2015-10-14 DIAGNOSIS — Y939 Activity, unspecified: Secondary | ICD-10-CM | POA: Diagnosis not present

## 2015-10-14 DIAGNOSIS — Y999 Unspecified external cause status: Secondary | ICD-10-CM | POA: Diagnosis not present

## 2015-10-14 DIAGNOSIS — R0789 Other chest pain: Secondary | ICD-10-CM | POA: Insufficient documentation

## 2015-10-14 DIAGNOSIS — Y92410 Unspecified street and highway as the place of occurrence of the external cause: Secondary | ICD-10-CM | POA: Insufficient documentation

## 2015-10-14 DIAGNOSIS — R51 Headache: Secondary | ICD-10-CM | POA: Insufficient documentation

## 2015-10-14 DIAGNOSIS — S8992XA Unspecified injury of left lower leg, initial encounter: Secondary | ICD-10-CM | POA: Diagnosis not present

## 2015-10-14 DIAGNOSIS — Z87891 Personal history of nicotine dependence: Secondary | ICD-10-CM | POA: Insufficient documentation

## 2015-10-14 DIAGNOSIS — T148 Other injury of unspecified body region: Secondary | ICD-10-CM | POA: Diagnosis not present

## 2015-10-14 DIAGNOSIS — T07 Unspecified multiple injuries: Secondary | ICD-10-CM | POA: Diagnosis not present
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
## 2015-10-15 NOTE — ED Provider Notes (Signed)
Patient left without being seen   Wandra Arthurs, MD 10/15/15 [phone]

## 2015-10-18 ENCOUNTER — Encounter: Payer: Self-pay | Admitting: Nurse Practitioner

## 2015-10-18 ENCOUNTER — Ambulatory Visit (INDEPENDENT_AMBULATORY_CARE_PROVIDER_SITE_OTHER): Payer: PPO | Admitting: Nurse Practitioner

## 2015-10-18 VITALS — BP 120/66 | HR 74 | Ht 63.0 in | Wt 147.2 lb

## 2015-10-18 DIAGNOSIS — R202 Paresthesia of skin: Secondary | ICD-10-CM | POA: Diagnosis not present

## 2015-10-18 DIAGNOSIS — E785 Hyperlipidemia, unspecified: Secondary | ICD-10-CM

## 2015-10-18 DIAGNOSIS — I10 Essential (primary) hypertension: Secondary | ICD-10-CM

## 2015-10-18 DIAGNOSIS — G458 Other transient cerebral ischemic attacks and related syndromes: Secondary | ICD-10-CM

## 2015-10-18 DIAGNOSIS — R200 Anesthesia of skin: Secondary | ICD-10-CM

## 2015-10-18 NOTE — Progress Notes (Signed)
I have read the note, and I agree with the clinical assessment and plan.  [name]   
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
## 2015-10-18 NOTE — Patient Instructions (Signed)
Continue aspirin for secondary stroke prevention 325mg   Strict control of blood pressure with goal 130/90 or below today's reading 120/66,  Hemoglobin AIC 6.5 or less LDL cholesterol less than 80, last 81 Continue walking everyday for exercise and overall health and well-being Healthy diet and increased fiber  EMG N/C for right arm numbness, was normal F/U in 6 months

## 2015-12-13 DIAGNOSIS — I129 Hypertensive chronic kidney disease with stage 1 through stage 4 chronic kidney disease, or unspecified chronic kidney disease: Secondary | ICD-10-CM | POA: Diagnosis not present

## 2015-12-13 DIAGNOSIS — E11319 Type 2 diabetes mellitus with unspecified diabetic retinopathy without macular edema: Secondary | ICD-10-CM | POA: Diagnosis not present

## 2015-12-13 DIAGNOSIS — E785 Hyperlipidemia, unspecified: Secondary | ICD-10-CM | POA: Diagnosis not present

## 2015-12-13 DIAGNOSIS — N183 Chronic kidney disease, stage 3 (moderate): Secondary | ICD-10-CM | POA: Diagnosis not present

## 2015-12-13 DIAGNOSIS — E1121 Type 2 diabetes mellitus with diabetic nephropathy: Secondary | ICD-10-CM | POA: Diagnosis not present

## 2016-01-15 DIAGNOSIS — H04123 Dry eye syndrome of bilateral lacrimal glands: Secondary | ICD-10-CM | POA: Diagnosis not present

## 2016-01-15 DIAGNOSIS — H01001 Unspecified blepharitis right upper eyelid: Secondary | ICD-10-CM | POA: Diagnosis not present

## 2016-01-15 DIAGNOSIS — H01002 Unspecified blepharitis right lower eyelid: Secondary | ICD-10-CM | POA: Diagnosis not present

## 2016-01-15 DIAGNOSIS — H01004 Unspecified blepharitis left upper eyelid: Secondary | ICD-10-CM | POA: Diagnosis not present

## 2016-02-15 DIAGNOSIS — N6002 Solitary cyst of left breast: Secondary | ICD-10-CM | POA: Diagnosis not present

## 2016-03-04 DIAGNOSIS — L989 Disorder of the skin and subcutaneous tissue, unspecified: Secondary | ICD-10-CM | POA: Diagnosis not present

## 2016-03-04 DIAGNOSIS — H6501 Acute serous otitis media, right ear: Secondary | ICD-10-CM | POA: Diagnosis not present

## 2016-03-04 DIAGNOSIS — H9193 Unspecified hearing loss, bilateral: Secondary | ICD-10-CM | POA: Diagnosis not present

## 2016-03-13 DIAGNOSIS — X32XXXA Exposure to sunlight, initial encounter: Secondary | ICD-10-CM | POA: Diagnosis not present

## 2016-03-13 DIAGNOSIS — L820 Inflamed seborrheic keratosis: Secondary | ICD-10-CM | POA: Diagnosis not present

## 2016-03-13 DIAGNOSIS — L570 Actinic keratosis: Secondary | ICD-10-CM | POA: Diagnosis not present

## 2016-03-13 DIAGNOSIS — D225 Melanocytic nevi of trunk: Secondary | ICD-10-CM | POA: Diagnosis not present

## 2016-03-14 DIAGNOSIS — H6521 Chronic serous otitis media, right ear: Secondary | ICD-10-CM | POA: Diagnosis not present

## 2016-03-14 DIAGNOSIS — J322 Chronic ethmoidal sinusitis: Secondary | ICD-10-CM | POA: Diagnosis not present

## 2016-03-14 DIAGNOSIS — J320 Chronic maxillary sinusitis: Secondary | ICD-10-CM | POA: Diagnosis not present

## 2016-03-14 DIAGNOSIS — J040 Acute laryngitis: Secondary | ICD-10-CM | POA: Diagnosis not present

## 2016-03-14 DIAGNOSIS — H6061 Unspecified chronic otitis externa, right ear: Secondary | ICD-10-CM | POA: Diagnosis not present

## 2016-03-26 DIAGNOSIS — H6521 Chronic serous otitis media, right ear: Secondary | ICD-10-CM | POA: Diagnosis not present

## 2016-03-26 DIAGNOSIS — J301 Allergic rhinitis due to pollen: Secondary | ICD-10-CM | POA: Diagnosis not present

## 2016-03-26 DIAGNOSIS — H6061 Unspecified chronic otitis externa, right ear: Secondary | ICD-10-CM | POA: Diagnosis not present

## 2016-04-24 ENCOUNTER — Encounter: Payer: Self-pay | Admitting: Nurse Practitioner

## 2016-04-24 ENCOUNTER — Ambulatory Visit (INDEPENDENT_AMBULATORY_CARE_PROVIDER_SITE_OTHER): Payer: PPO | Admitting: Nurse Practitioner

## 2016-04-24 VITALS — BP 130/75 | HR 69 | Ht 63.0 in | Wt 149.6 lb

## 2016-04-24 DIAGNOSIS — G458 Other transient cerebral ischemic attacks and related syndromes: Secondary | ICD-10-CM

## 2016-04-24 DIAGNOSIS — I10 Essential (primary) hypertension: Secondary | ICD-10-CM

## 2016-04-24 DIAGNOSIS — E785 Hyperlipidemia, unspecified: Secondary | ICD-10-CM | POA: Diagnosis not present

## 2016-04-24 NOTE — Patient Instructions (Addendum)
Keep systolic blood pressure less than 130, today's reading 130/75 Lipids are followed by PCP continue Zocor Hemoglobin A1c 6.5 or less Continue Aspirin for  secondary stroke prevention No further stroke or TIA symptoms since  Feb 2017 Get back into exercise program If recurrent stroke symptoms occur, call 911 and proceed to the hospital Discharge from neurologic services at this time
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
## 2016-05-06 DIAGNOSIS — H01001 Unspecified blepharitis right upper eyelid: Secondary | ICD-10-CM | POA: Diagnosis not present

## 2016-05-06 DIAGNOSIS — H04123 Dry eye syndrome of bilateral lacrimal glands: Secondary | ICD-10-CM | POA: Diagnosis not present

## 2016-05-06 DIAGNOSIS — E113292 Type 2 diabetes mellitus with mild nonproliferative diabetic retinopathy without macular edema, left eye: Secondary | ICD-10-CM | POA: Diagnosis not present

## 2016-05-06 DIAGNOSIS — H52203 Unspecified astigmatism, bilateral: Secondary | ICD-10-CM | POA: Diagnosis not present

## 2016-05-19 NOTE — Progress Notes (Signed)
  Personally participated in and made any corrections needed to history, physical, neuro exam,assessment and plan as stated above and agree with plan.   Antonia Ahern, MD 

## 2016-06-03 DIAGNOSIS — B078 Other viral warts: Secondary | ICD-10-CM | POA: Diagnosis not present

## 2016-06-26 DIAGNOSIS — G4700 Insomnia, unspecified: Secondary | ICD-10-CM | POA: Diagnosis not present

## 2016-06-26 DIAGNOSIS — Z1389 Encounter for screening for other disorder: Secondary | ICD-10-CM | POA: Diagnosis not present

## 2016-06-26 DIAGNOSIS — M8588 Other specified disorders of bone density and structure, other site: Secondary | ICD-10-CM | POA: Diagnosis not present

## 2016-06-26 DIAGNOSIS — E1121 Type 2 diabetes mellitus with diabetic nephropathy: Secondary | ICD-10-CM | POA: Diagnosis not present

## 2016-06-26 DIAGNOSIS — Z0000 Encounter for general adult medical examination without abnormal findings: Secondary | ICD-10-CM | POA: Diagnosis not present

## 2016-06-26 DIAGNOSIS — I129 Hypertensive chronic kidney disease with stage 1 through stage 4 chronic kidney disease, or unspecified chronic kidney disease: Secondary | ICD-10-CM | POA: Diagnosis not present

## 2016-06-26 DIAGNOSIS — E559 Vitamin D deficiency, unspecified: Secondary | ICD-10-CM | POA: Diagnosis not present

## 2016-06-26 DIAGNOSIS — J301 Allergic rhinitis due to pollen: Secondary | ICD-10-CM | POA: Diagnosis not present

## 2016-06-26 DIAGNOSIS — E11319 Type 2 diabetes mellitus with unspecified diabetic retinopathy without macular edema: Secondary | ICD-10-CM | POA: Diagnosis not present

## 2016-06-26 DIAGNOSIS — N183 Chronic kidney disease, stage 3 (moderate): Secondary | ICD-10-CM | POA: Diagnosis not present

## 2016-06-26 DIAGNOSIS — N8111 Cystocele, midline: Secondary | ICD-10-CM | POA: Diagnosis not present

## 2016-06-26 DIAGNOSIS — E785 Hyperlipidemia, unspecified: Secondary | ICD-10-CM | POA: Diagnosis not present

## 2016-08-01 DIAGNOSIS — M8588 Other specified disorders of bone density and structure, other site: Secondary | ICD-10-CM | POA: Diagnosis not present

## 2016-08-13 DIAGNOSIS — M65322 Trigger finger, left index finger: Secondary | ICD-10-CM | POA: Diagnosis not present

## 2016-08-13 DIAGNOSIS — M65311 Trigger thumb, right thumb: Secondary | ICD-10-CM | POA: Diagnosis not present

## 2016-08-15 DIAGNOSIS — N6002 Solitary cyst of left breast: Secondary | ICD-10-CM | POA: Diagnosis not present

## 2016-08-29 DIAGNOSIS — M65311 Trigger thumb, right thumb: Secondary | ICD-10-CM | POA: Diagnosis not present

## 2016-09-06 DIAGNOSIS — L299 Pruritus, unspecified: Secondary | ICD-10-CM | POA: Diagnosis not present

## 2016-09-06 DIAGNOSIS — F439 Reaction to severe stress, unspecified: Secondary | ICD-10-CM | POA: Diagnosis not present

## 2016-10-28 DIAGNOSIS — E11319 Type 2 diabetes mellitus with unspecified diabetic retinopathy without macular edema: Secondary | ICD-10-CM | POA: Diagnosis not present

## 2016-10-28 DIAGNOSIS — E785 Hyperlipidemia, unspecified: Secondary | ICD-10-CM | POA: Diagnosis not present

## 2016-10-28 DIAGNOSIS — I129 Hypertensive chronic kidney disease with stage 1 through stage 4 chronic kidney disease, or unspecified chronic kidney disease: Secondary | ICD-10-CM | POA: Diagnosis not present

## 2016-10-28 DIAGNOSIS — Z7984 Long term (current) use of oral hypoglycemic drugs: Secondary | ICD-10-CM | POA: Diagnosis not present

## 2016-10-28 DIAGNOSIS — N183 Chronic kidney disease, stage 3 (moderate): Secondary | ICD-10-CM | POA: Diagnosis not present

## 2016-10-31 DIAGNOSIS — M65322 Trigger finger, left index finger: Secondary | ICD-10-CM | POA: Diagnosis not present

## 2016-11-11 DIAGNOSIS — M65322 Trigger finger, left index finger: Secondary | ICD-10-CM | POA: Diagnosis not present

## 2016-12-18 DIAGNOSIS — H01001 Unspecified blepharitis right upper eyelid: Secondary | ICD-10-CM | POA: Diagnosis not present

## 2016-12-18 DIAGNOSIS — H04123 Dry eye syndrome of bilateral lacrimal glands: Secondary | ICD-10-CM | POA: Diagnosis not present

## 2016-12-18 DIAGNOSIS — H01004 Unspecified blepharitis left upper eyelid: Secondary | ICD-10-CM | POA: Diagnosis not present

## 2016-12-18 DIAGNOSIS — H01005 Unspecified blepharitis left lower eyelid: Secondary | ICD-10-CM | POA: Diagnosis not present

## 2017-03-31 DIAGNOSIS — J069 Acute upper respiratory infection, unspecified: Secondary | ICD-10-CM | POA: Diagnosis not present

## 2017-03-31 DIAGNOSIS — J029 Acute pharyngitis, unspecified: Secondary | ICD-10-CM | POA: Diagnosis not present

## 2017-06-27 DIAGNOSIS — Z0000 Encounter for general adult medical examination without abnormal findings: Secondary | ICD-10-CM | POA: Diagnosis not present

## 2017-06-27 DIAGNOSIS — E559 Vitamin D deficiency, unspecified: Secondary | ICD-10-CM | POA: Diagnosis not present

## 2017-06-27 DIAGNOSIS — I129 Hypertensive chronic kidney disease with stage 1 through stage 4 chronic kidney disease, or unspecified chronic kidney disease: Secondary | ICD-10-CM | POA: Diagnosis not present

## 2017-06-27 DIAGNOSIS — N183 Chronic kidney disease, stage 3 (moderate): Secondary | ICD-10-CM | POA: Diagnosis not present

## 2017-06-27 DIAGNOSIS — G4700 Insomnia, unspecified: Secondary | ICD-10-CM | POA: Diagnosis not present

## 2017-06-27 DIAGNOSIS — E11319 Type 2 diabetes mellitus with unspecified diabetic retinopathy without macular edema: Secondary | ICD-10-CM | POA: Diagnosis not present

## 2017-06-27 DIAGNOSIS — J309 Allergic rhinitis, unspecified: Secondary | ICD-10-CM | POA: Diagnosis not present

## 2017-06-27 DIAGNOSIS — E1121 Type 2 diabetes mellitus with diabetic nephropathy: Secondary | ICD-10-CM | POA: Diagnosis not present

## 2017-06-27 DIAGNOSIS — E785 Hyperlipidemia, unspecified: Secondary | ICD-10-CM | POA: Diagnosis not present

## 2017-07-16 DIAGNOSIS — H04123 Dry eye syndrome of bilateral lacrimal glands: Secondary | ICD-10-CM | POA: Diagnosis not present

## 2017-07-16 DIAGNOSIS — E113292 Type 2 diabetes mellitus with mild nonproliferative diabetic retinopathy without macular edema, left eye: Secondary | ICD-10-CM | POA: Diagnosis not present

## 2017-07-16 DIAGNOSIS — H52203 Unspecified astigmatism, bilateral: Secondary | ICD-10-CM | POA: Diagnosis not present

## 2017-07-16 DIAGNOSIS — Z961 Presence of intraocular lens: Secondary | ICD-10-CM | POA: Diagnosis not present

## 2017-08-19 DIAGNOSIS — Z803 Family history of malignant neoplasm of breast: Secondary | ICD-10-CM | POA: Diagnosis not present

## 2017-08-19 DIAGNOSIS — Z1231 Encounter for screening mammogram for malignant neoplasm of breast: Secondary | ICD-10-CM | POA: Diagnosis not present

## 2017-08-20 DIAGNOSIS — M5412 Radiculopathy, cervical region: Secondary | ICD-10-CM | POA: Diagnosis not present

## 2017-08-28 DIAGNOSIS — M5412 Radiculopathy, cervical region: Secondary | ICD-10-CM | POA: Diagnosis not present

## 2017-08-28 DIAGNOSIS — M4803 Spinal stenosis, cervicothoracic region: Secondary | ICD-10-CM | POA: Diagnosis not present

## 2017-08-28 DIAGNOSIS — M47812 Spondylosis without myelopathy or radiculopathy, cervical region: Secondary | ICD-10-CM | POA: Diagnosis not present

## 2017-09-03 DIAGNOSIS — M5412 Radiculopathy, cervical region: Secondary | ICD-10-CM | POA: Diagnosis not present

## 2017-09-09 DIAGNOSIS — R200 Anesthesia of skin: Secondary | ICD-10-CM | POA: Diagnosis not present

## 2017-09-09 DIAGNOSIS — G4700 Insomnia, unspecified: Secondary | ICD-10-CM | POA: Diagnosis not present

## 2017-10-15 DIAGNOSIS — Z8673 Personal history of transient ischemic attack (TIA), and cerebral infarction without residual deficits: Secondary | ICD-10-CM | POA: Diagnosis not present

## 2017-10-15 DIAGNOSIS — M7918 Myalgia, other site: Secondary | ICD-10-CM | POA: Diagnosis not present

## 2017-10-15 DIAGNOSIS — M79641 Pain in right hand: Secondary | ICD-10-CM | POA: Diagnosis not present

## 2017-10-15 DIAGNOSIS — R202 Paresthesia of skin: Secondary | ICD-10-CM | POA: Diagnosis not present

## 2017-11-11 DIAGNOSIS — M353 Polymyalgia rheumatica: Secondary | ICD-10-CM | POA: Diagnosis not present

## 2017-11-24 DIAGNOSIS — M2550 Pain in unspecified joint: Secondary | ICD-10-CM | POA: Diagnosis not present

## 2017-11-24 DIAGNOSIS — M791 Myalgia, unspecified site: Secondary | ICD-10-CM | POA: Diagnosis not present

## 2017-11-24 DIAGNOSIS — E119 Type 2 diabetes mellitus without complications: Secondary | ICD-10-CM | POA: Diagnosis not present

## 2017-11-24 DIAGNOSIS — Z13820 Encounter for screening for osteoporosis: Secondary | ICD-10-CM | POA: Diagnosis not present

## 2017-11-28 DIAGNOSIS — R69 Illness, unspecified: Secondary | ICD-10-CM | POA: Diagnosis not present

## 2017-12-09 DIAGNOSIS — M2550 Pain in unspecified joint: Secondary | ICD-10-CM | POA: Diagnosis not present

## 2017-12-09 DIAGNOSIS — M19041 Primary osteoarthritis, right hand: Secondary | ICD-10-CM | POA: Diagnosis not present

## 2017-12-09 DIAGNOSIS — M19042 Primary osteoarthritis, left hand: Secondary | ICD-10-CM | POA: Diagnosis not present

## 2017-12-09 DIAGNOSIS — N183 Chronic kidney disease, stage 3 (moderate): Secondary | ICD-10-CM | POA: Diagnosis not present

## 2017-12-09 DIAGNOSIS — Z79899 Other long term (current) drug therapy: Secondary | ICD-10-CM | POA: Diagnosis not present

## 2017-12-09 DIAGNOSIS — R768 Other specified abnormal immunological findings in serum: Secondary | ICD-10-CM | POA: Diagnosis not present

## 2017-12-09 DIAGNOSIS — E119 Type 2 diabetes mellitus without complications: Secondary | ICD-10-CM | POA: Diagnosis not present

## 2018-01-02 DIAGNOSIS — E113292 Type 2 diabetes mellitus with mild nonproliferative diabetic retinopathy without macular edema, left eye: Secondary | ICD-10-CM | POA: Diagnosis not present

## 2018-01-02 DIAGNOSIS — Z79899 Other long term (current) drug therapy: Secondary | ICD-10-CM | POA: Diagnosis not present

## 2018-01-02 DIAGNOSIS — Z961 Presence of intraocular lens: Secondary | ICD-10-CM | POA: Diagnosis not present

## 2018-01-06 DIAGNOSIS — R350 Frequency of micturition: Secondary | ICD-10-CM | POA: Diagnosis not present

## 2018-01-06 DIAGNOSIS — R3912 Poor urinary stream: Secondary | ICD-10-CM | POA: Diagnosis not present

## 2018-01-06 DIAGNOSIS — N8111 Cystocele, midline: Secondary | ICD-10-CM | POA: Diagnosis not present

## 2018-01-06 DIAGNOSIS — R351 Nocturia: Secondary | ICD-10-CM | POA: Diagnosis not present

## 2018-01-15 DIAGNOSIS — M353 Polymyalgia rheumatica: Secondary | ICD-10-CM | POA: Diagnosis not present

## 2018-01-15 DIAGNOSIS — M2550 Pain in unspecified joint: Secondary | ICD-10-CM | POA: Diagnosis not present

## 2018-01-15 DIAGNOSIS — M79641 Pain in right hand: Secondary | ICD-10-CM | POA: Diagnosis not present

## 2018-01-15 DIAGNOSIS — R5383 Other fatigue: Secondary | ICD-10-CM | POA: Diagnosis not present

## 2018-01-15 DIAGNOSIS — Z6824 Body mass index (BMI) 24.0-24.9, adult: Secondary | ICD-10-CM | POA: Diagnosis not present

## 2018-01-15 DIAGNOSIS — M79642 Pain in left hand: Secondary | ICD-10-CM | POA: Diagnosis not present

## 2018-01-15 DIAGNOSIS — M150 Primary generalized (osteo)arthritis: Secondary | ICD-10-CM | POA: Diagnosis not present

## 2018-01-15 DIAGNOSIS — M0579 Rheumatoid arthritis with rheumatoid factor of multiple sites without organ or systems involvement: Secondary | ICD-10-CM | POA: Diagnosis not present

## 2018-02-02 DIAGNOSIS — E1121 Type 2 diabetes mellitus with diabetic nephropathy: Secondary | ICD-10-CM | POA: Diagnosis not present

## 2018-02-02 DIAGNOSIS — E785 Hyperlipidemia, unspecified: Secondary | ICD-10-CM | POA: Diagnosis not present

## 2018-02-02 DIAGNOSIS — I129 Hypertensive chronic kidney disease with stage 1 through stage 4 chronic kidney disease, or unspecified chronic kidney disease: Secondary | ICD-10-CM | POA: Diagnosis not present

## 2018-02-02 DIAGNOSIS — E11319 Type 2 diabetes mellitus with unspecified diabetic retinopathy without macular edema: Secondary | ICD-10-CM | POA: Diagnosis not present

## 2018-02-02 DIAGNOSIS — M059 Rheumatoid arthritis with rheumatoid factor, unspecified: Secondary | ICD-10-CM | POA: Diagnosis not present

## 2018-02-02 DIAGNOSIS — M353 Polymyalgia rheumatica: Secondary | ICD-10-CM | POA: Diagnosis not present

## 2018-02-02 DIAGNOSIS — N183 Chronic kidney disease, stage 3 (moderate): Secondary | ICD-10-CM | POA: Diagnosis not present

## 2018-02-05 DIAGNOSIS — M069 Rheumatoid arthritis, unspecified: Secondary | ICD-10-CM | POA: Diagnosis not present

## 2018-02-05 DIAGNOSIS — Z803 Family history of malignant neoplasm of breast: Secondary | ICD-10-CM | POA: Diagnosis not present

## 2018-02-05 DIAGNOSIS — I10 Essential (primary) hypertension: Secondary | ICD-10-CM | POA: Diagnosis not present

## 2018-02-05 DIAGNOSIS — Z7952 Long term (current) use of systemic steroids: Secondary | ICD-10-CM | POA: Diagnosis not present

## 2018-02-05 DIAGNOSIS — E119 Type 2 diabetes mellitus without complications: Secondary | ICD-10-CM | POA: Diagnosis not present

## 2018-02-05 DIAGNOSIS — J309 Allergic rhinitis, unspecified: Secondary | ICD-10-CM | POA: Diagnosis not present

## 2018-02-05 DIAGNOSIS — R69 Illness, unspecified: Secondary | ICD-10-CM | POA: Diagnosis not present

## 2018-02-05 DIAGNOSIS — E785 Hyperlipidemia, unspecified: Secondary | ICD-10-CM | POA: Diagnosis not present

## 2018-02-05 DIAGNOSIS — Z7984 Long term (current) use of oral hypoglycemic drugs: Secondary | ICD-10-CM | POA: Diagnosis not present

## 2018-02-05 DIAGNOSIS — Z7982 Long term (current) use of aspirin: Secondary | ICD-10-CM | POA: Diagnosis not present

## 2018-02-12 DIAGNOSIS — M353 Polymyalgia rheumatica: Secondary | ICD-10-CM | POA: Diagnosis not present

## 2018-02-12 DIAGNOSIS — M150 Primary generalized (osteo)arthritis: Secondary | ICD-10-CM | POA: Diagnosis not present

## 2018-02-12 DIAGNOSIS — M2550 Pain in unspecified joint: Secondary | ICD-10-CM | POA: Diagnosis not present

## 2018-02-12 DIAGNOSIS — Z79899 Other long term (current) drug therapy: Secondary | ICD-10-CM | POA: Diagnosis not present

## 2018-02-12 DIAGNOSIS — Z6824 Body mass index (BMI) 24.0-24.9, adult: Secondary | ICD-10-CM | POA: Diagnosis not present

## 2018-02-12 DIAGNOSIS — M0579 Rheumatoid arthritis with rheumatoid factor of multiple sites without organ or systems involvement: Secondary | ICD-10-CM | POA: Diagnosis not present

## 2018-03-10 DIAGNOSIS — R69 Illness, unspecified: Secondary | ICD-10-CM | POA: Diagnosis not present

## 2018-03-13 DIAGNOSIS — R69 Illness, unspecified: Secondary | ICD-10-CM | POA: Diagnosis not present

## 2018-04-07 DIAGNOSIS — M2550 Pain in unspecified joint: Secondary | ICD-10-CM | POA: Diagnosis not present

## 2018-04-07 DIAGNOSIS — M0579 Rheumatoid arthritis with rheumatoid factor of multiple sites without organ or systems involvement: Secondary | ICD-10-CM | POA: Diagnosis not present

## 2018-04-07 DIAGNOSIS — Z79899 Other long term (current) drug therapy: Secondary | ICD-10-CM | POA: Diagnosis not present

## 2018-04-07 DIAGNOSIS — M150 Primary generalized (osteo)arthritis: Secondary | ICD-10-CM | POA: Diagnosis not present

## 2018-06-09 DIAGNOSIS — R69 Illness, unspecified: Secondary | ICD-10-CM | POA: Diagnosis not present

## 2018-07-01 DIAGNOSIS — L723 Sebaceous cyst: Secondary | ICD-10-CM | POA: Diagnosis not present

## 2018-07-01 DIAGNOSIS — E559 Vitamin D deficiency, unspecified: Secondary | ICD-10-CM | POA: Diagnosis not present

## 2018-07-01 DIAGNOSIS — N183 Chronic kidney disease, stage 3 (moderate): Secondary | ICD-10-CM | POA: Diagnosis not present

## 2018-07-01 DIAGNOSIS — E1121 Type 2 diabetes mellitus with diabetic nephropathy: Secondary | ICD-10-CM | POA: Diagnosis not present

## 2018-07-01 DIAGNOSIS — M353 Polymyalgia rheumatica: Secondary | ICD-10-CM | POA: Diagnosis not present

## 2018-07-01 DIAGNOSIS — I129 Hypertensive chronic kidney disease with stage 1 through stage 4 chronic kidney disease, or unspecified chronic kidney disease: Secondary | ICD-10-CM | POA: Diagnosis not present

## 2018-07-01 DIAGNOSIS — Z0000 Encounter for general adult medical examination without abnormal findings: Secondary | ICD-10-CM | POA: Diagnosis not present

## 2018-07-01 DIAGNOSIS — E11319 Type 2 diabetes mellitus with unspecified diabetic retinopathy without macular edema: Secondary | ICD-10-CM | POA: Diagnosis not present

## 2018-07-01 DIAGNOSIS — M059 Rheumatoid arthritis with rheumatoid factor, unspecified: Secondary | ICD-10-CM | POA: Diagnosis not present

## 2018-07-01 DIAGNOSIS — E785 Hyperlipidemia, unspecified: Secondary | ICD-10-CM | POA: Diagnosis not present

## 2018-07-03 DIAGNOSIS — L723 Sebaceous cyst: Secondary | ICD-10-CM | POA: Diagnosis not present

## 2018-08-09 DIAGNOSIS — R69 Illness, unspecified: Secondary | ICD-10-CM | POA: Diagnosis not present

## 2018-08-17 DIAGNOSIS — N6452 Nipple discharge: Secondary | ICD-10-CM | POA: Diagnosis not present

## 2018-08-18 DIAGNOSIS — Z803 Family history of malignant neoplasm of breast: Secondary | ICD-10-CM | POA: Diagnosis not present

## 2018-08-18 DIAGNOSIS — N6042 Mammary duct ectasia of left breast: Secondary | ICD-10-CM | POA: Diagnosis not present

## 2018-08-18 DIAGNOSIS — N6002 Solitary cyst of left breast: Secondary | ICD-10-CM | POA: Diagnosis not present

## 2018-08-18 DIAGNOSIS — N6324 Unspecified lump in the left breast, lower inner quadrant: Secondary | ICD-10-CM | POA: Diagnosis not present

## 2018-08-18 DIAGNOSIS — N6452 Nipple discharge: Secondary | ICD-10-CM | POA: Diagnosis not present

## 2018-08-18 DIAGNOSIS — R921 Mammographic calcification found on diagnostic imaging of breast: Secondary | ICD-10-CM | POA: Diagnosis not present

## 2018-08-25 ENCOUNTER — Other Ambulatory Visit: Payer: Self-pay | Admitting: Radiology

## 2018-08-25 DIAGNOSIS — N6323 Unspecified lump in the left breast, lower outer quadrant: Secondary | ICD-10-CM | POA: Diagnosis not present

## 2018-08-25 DIAGNOSIS — R921 Mammographic calcification found on diagnostic imaging of breast: Secondary | ICD-10-CM | POA: Diagnosis not present

## 2018-08-25 DIAGNOSIS — N6011 Diffuse cystic mastopathy of right breast: Secondary | ICD-10-CM | POA: Diagnosis not present

## 2018-08-25 DIAGNOSIS — D242 Benign neoplasm of left breast: Secondary | ICD-10-CM | POA: Diagnosis not present

## 2018-09-07 ENCOUNTER — Other Ambulatory Visit: Payer: Self-pay | Admitting: Surgery

## 2018-09-07 DIAGNOSIS — N6452 Nipple discharge: Secondary | ICD-10-CM | POA: Diagnosis not present

## 2018-09-07 DIAGNOSIS — D242 Benign neoplasm of left breast: Secondary | ICD-10-CM

## 2018-09-30 DIAGNOSIS — M353 Polymyalgia rheumatica: Secondary | ICD-10-CM | POA: Diagnosis not present

## 2018-09-30 DIAGNOSIS — Z79899 Other long term (current) drug therapy: Secondary | ICD-10-CM | POA: Diagnosis not present

## 2018-09-30 DIAGNOSIS — M150 Primary generalized (osteo)arthritis: Secondary | ICD-10-CM | POA: Diagnosis not present

## 2018-09-30 DIAGNOSIS — M0579 Rheumatoid arthritis with rheumatoid factor of multiple sites without organ or systems involvement: Secondary | ICD-10-CM | POA: Diagnosis not present

## 2018-09-30 DIAGNOSIS — M2550 Pain in unspecified joint: Secondary | ICD-10-CM | POA: Diagnosis not present

## 2018-10-15 DIAGNOSIS — B078 Other viral warts: Secondary | ICD-10-CM | POA: Diagnosis not present

## 2018-10-15 DIAGNOSIS — X32XXXD Exposure to sunlight, subsequent encounter: Secondary | ICD-10-CM | POA: Diagnosis not present

## 2018-10-15 DIAGNOSIS — L570 Actinic keratosis: Secondary | ICD-10-CM | POA: Diagnosis not present

## 2018-10-23 DIAGNOSIS — R69 Illness, unspecified: Secondary | ICD-10-CM | POA: Diagnosis not present

## 2018-10-26 DIAGNOSIS — R69 Illness, unspecified: Secondary | ICD-10-CM | POA: Diagnosis not present

## 2018-11-03 ENCOUNTER — Other Ambulatory Visit: Payer: Self-pay

## 2018-11-03 ENCOUNTER — Encounter (HOSPITAL_BASED_OUTPATIENT_CLINIC_OR_DEPARTMENT_OTHER): Payer: Self-pay | Admitting: *Deleted

## 2018-11-06 ENCOUNTER — Other Ambulatory Visit: Payer: Self-pay

## 2018-11-06 ENCOUNTER — Other Ambulatory Visit (HOSPITAL_COMMUNITY)
Admission: RE | Admit: 2018-11-06 | Discharge: 2018-11-06 | Disposition: A | Discharge status: Home / Self Care | Payer: Medicare HMO | Source: Ambulatory Visit | Attending: Surgery | Admitting: Surgery

## 2018-11-06 ENCOUNTER — Encounter (HOSPITAL_BASED_OUTPATIENT_CLINIC_OR_DEPARTMENT_OTHER)
Admission: RE | Admit: 2018-11-06 | Discharge: 2018-11-06 | Disposition: A | Discharge status: Home / Self Care | Payer: Medicare HMO | Source: Ambulatory Visit | Attending: Surgery | Admitting: Surgery

## 2018-11-06 DIAGNOSIS — Z885 Allergy status to narcotic agent status: Secondary | ICD-10-CM | POA: Diagnosis not present

## 2018-11-06 DIAGNOSIS — Z886 Allergy status to analgesic agent status: Secondary | ICD-10-CM | POA: Diagnosis not present

## 2018-11-06 DIAGNOSIS — Z7952 Long term (current) use of systemic steroids: Secondary | ICD-10-CM | POA: Diagnosis not present

## 2018-11-06 DIAGNOSIS — Z803 Family history of malignant neoplasm of breast: Secondary | ICD-10-CM | POA: Diagnosis not present

## 2018-11-06 DIAGNOSIS — I10 Essential (primary) hypertension: Secondary | ICD-10-CM | POA: Diagnosis not present

## 2018-11-06 DIAGNOSIS — N6452 Nipple discharge: Secondary | ICD-10-CM | POA: Diagnosis not present

## 2018-11-06 DIAGNOSIS — Z1159 Encounter for screening for other viral diseases: Secondary | ICD-10-CM | POA: Diagnosis not present

## 2018-11-06 DIAGNOSIS — D242 Benign neoplasm of left breast: Secondary | ICD-10-CM | POA: Diagnosis not present

## 2018-11-06 DIAGNOSIS — N6032 Fibrosclerosis of left breast: Secondary | ICD-10-CM | POA: Diagnosis not present

## 2018-11-06 DIAGNOSIS — Z01812 Encounter for preprocedural laboratory examination: Secondary | ICD-10-CM | POA: Insufficient documentation

## 2018-11-06 DIAGNOSIS — Z87891 Personal history of nicotine dependence: Secondary | ICD-10-CM | POA: Diagnosis not present

## 2018-11-06 DIAGNOSIS — Z888 Allergy status to other drugs, medicaments and biological substances status: Secondary | ICD-10-CM | POA: Diagnosis not present

## 2018-11-06 DIAGNOSIS — Z7984 Long term (current) use of oral hypoglycemic drugs: Secondary | ICD-10-CM | POA: Diagnosis not present

## 2018-11-06 DIAGNOSIS — E7800 Pure hypercholesterolemia, unspecified: Secondary | ICD-10-CM | POA: Diagnosis not present

## 2018-11-06 DIAGNOSIS — E119 Type 2 diabetes mellitus without complications: Secondary | ICD-10-CM | POA: Diagnosis not present

## 2018-11-06 DIAGNOSIS — Z90710 Acquired absence of both cervix and uterus: Secondary | ICD-10-CM | POA: Diagnosis not present

## 2018-11-06 DIAGNOSIS — Z79899 Other long term (current) drug therapy: Secondary | ICD-10-CM | POA: Diagnosis not present

## 2018-11-06 DIAGNOSIS — Z8249 Family history of ischemic heart disease and other diseases of the circulatory system: Secondary | ICD-10-CM | POA: Diagnosis not present

## 2018-11-06 DIAGNOSIS — Z7982 Long term (current) use of aspirin: Secondary | ICD-10-CM | POA: Diagnosis not present

## 2018-11-06 LAB — BASIC METABOLIC PANEL
Anion gap: 11 (ref 5–15)
BUN: 21 mg/dL (ref 8–23)
CO2: 31 mmol/L (ref 22–32)
Calcium: 9.7 mg/dL (ref 8.9–10.3)
Chloride: 101 mmol/L (ref 98–111)
Creatinine, Ser: 1.12 mg/dL — ABNORMAL HIGH (ref 0.44–1.00)
GFR calc Af Amer: 56 mL/min — ABNORMAL LOW (ref >60)
GFR calc non Af Amer: 48 mL/min — ABNORMAL LOW (ref >60)
Glucose, Bld: 108 mg/dL — ABNORMAL HIGH (ref 70–99)
Potassium: 3.5 mmol/L (ref 3.5–5.1)
Sodium: 143 mmol/L (ref 135–145)

## 2018-11-06 LAB — SARS CORONAVIRUS 2 (TAT 6-24 HRS): SARS Coronavirus 2: NEGATIVE

## 2018-11-06 NOTE — Progress Notes (Signed)
Gatorade 2 drink given with instructions to complete by [facility], pt verbalized understanding.   EKG reviewed by Dr. Fransisco Beau, will proceed with surgery as scheduled.

## 2018-11-09 DIAGNOSIS — D242 Benign neoplasm of left breast: Secondary | ICD-10-CM | POA: Diagnosis not present

## 2018-11-09 NOTE — H&P (Signed)
[name]  Location: [facility] Patient #: [id number] DOB: Sep 13, 1945 Married / Language: English / Race: White Female   History of Present Illness (Navjot Loera A. Ninfa Linden MD;  The patient is a 73 year old female who presents with a complaint of nipple discharge. This patient is referred by Dr. Gae Dry. She has undergone mammography of the breast for left breast bloody nipple discharge. She is found to have calcifications in the right breast and a dilated duct in the left breast. Stereotactic biopsy was performed on both areas. The right breast showed benign calcifications with fibrocystic changes. The left breast showed an intraductal papilloma. She has had no previous surgery on her breast. She has a family history of breast cancer in her sister diagnosed in her 29s. She is otherwise without complaints.   Past Surgical History Malachi Bonds, CMA;  Breast Biopsy  Bilateral. Colon Polyp Removal - Colonoscopy  Foot Surgery  Right. Gallbladder Surgery - Laparoscopic  Hemorrhoidectomy  Hysterectomy (not due to cancer) - Complete  Spinal Surgery - Lower Back  Spinal Surgery - Neck   Diagnostic Studies History Malachi Bonds, CMA;  Colonoscopy  1-5 years ago within last year Mammogram  within last year  Allergies (Chemira Jones, CMA;  PM) Vicodin *ANALGESICS - OPIOID*  Lipitor *ANTIHYPERLIPIDEMICS*  Crestor *ANTIHYPERLIPIDEMICS*  Vicodin *ANALGESICS - OPIOID*  Plavix *HEMATOLOGICAL AGENTS - MISC.*   Medication History (Chemira Jones, CMA;  metFORMIN HCl ER (500MG  Tablet ER 24HR, Oral) Active. Simvastatin (20MG  Tablet, Oral) Active. predniSONE (5MG  Tablet, Oral) Active. LORazepam (0.5MG  Tablet, Oral) Active. Hydroxychloroquine Sulfate (200MG  Tablet, Oral) Active. Dilt-XR (240MG  Capsule ER 24HR, Oral) Active. Aspirin (325MG  Tablet, Oral) Active. Vitamin D (Ergocalciferol) (50000UNIT Capsule, Oral weekly)  Active. Lisinopril-Hydrochlorothiazide (20-12.5MG  Tablet, Oral daily) Active. Zetia (10MG  Tablet, Oral daily) Active.  Social History ( Alcohol use  Occasional alcohol use. Caffeine use  Coffee, Tea. No drug use  Tobacco use  Former smoker.  Family History  Alcohol Abuse  Brother. Arthritis  Mother. Breast Cancer  Sister. Cerebrovascular Accident  Father. Heart Disease  Brother, Father, Sister. Heart disease in female family member before age 14  Hypertension  Brother, Father, Sister.  Pregnancy / Birth History Malachi Bonds, CMA;  Age at menarche  54 years. Age of menopause  <45 68-50 Maternal age  34-25  Other Problems Malachi Bonds, [place];   Arthritis  Cerebrovascular Accident  Diabetes Mellitus  Hemorrhoids  High blood pressure  Hypercholesterolemia     Review of Systems General Not Present- Appetite Loss, Chills, Fatigue, Fever, Night Sweats, Weight Gain and Weight Loss. Skin Not Present- Change in Wart/Mole, Dryness, Hives, Jaundice, New Lesions, Non-Healing Wounds, Rash and Ulcer. HEENT Not Present- Earache, Hearing Loss, Hoarseness, Nose Bleed, Oral Ulcers, Ringing in the Ears, Seasonal Allergies, Sinus Pain, Sore Throat, Visual Disturbances, Wears glasses/contact lenses and Yellow Eyes. Cardiovascular Not Present- Chest Pain, Difficulty Breathing Lying Down, Leg Cramps, Palpitations, Rapid Heart Rate, Shortness of Breath and Swelling of Extremities. Gastrointestinal Not Present- Abdominal Pain, Bloating, Bloody Stool, Change in Bowel Habits, Chronic diarrhea, Constipation, Difficulty Swallowing, Excessive gas, Gets full quickly at meals, Hemorrhoids, Indigestion, Nausea, Rectal Pain and Vomiting. Neurological Not Present- Decreased Memory, Fainting, Headaches, Numbness, Seizures, Tingling, Tremor, Trouble walking and Weakness. Endocrine Not Present- Cold Intolerance, Excessive Hunger, Hair Changes, Heat Intolerance, Hot flashes and New  Diabetes.  Vitals  Weight: 135.8 lb Height: 62in Body Surface Area: 1.62 m Body Mass Index: 24.84 kg/m  Pulse: 59 (Regular)  BP: 118/82(Sitting, Left Arm,  Standard)    Physical Exam  General Mental Status-Alert. General Appearance-Consistent with stated age. Hydration-Well hydrated. Voice-Normal.  Head and Neck Head-normocephalic, atraumatic with no lesions or palpable masses. Trachea-midline. Thyroid Gland Characteristics - normal size and consistency.  Eye Eyeball - Bilateral-Extraocular movements intact. Sclera/Conjunctiva - Bilateral-No scleral icterus.  Chest and Lung Exam Chest and lung exam reveals -quiet, even and easy respiratory effort with no use of accessory muscles and on auscultation, normal breath sounds, no adventitious sounds and normal vocal resonance. Inspection Chest Wall - Normal. Back - normal.  Breast Breast - Left-Symmetric, Non Tender, No Biopsy scars, no Dimpling, No Inflammation, No Lumpectomy scars, No Mastectomy scars, No Peau d' Orange. Breast - Right-Symmetric, Non Tender, No Biopsy scars, no Dimpling, No Inflammation, No Lumpectomy scars, No Mastectomy scars, No Peau d' Orange. Breast Lump-No Palpable Breast Mass. Note: There is mild ecchymosis from the biopsies but no palpable masses and no adenopathy.   Cardiovascular Cardiovascular examination reveals -normal heart sounds, regular rate and rhythm with no murmurs and normal pedal pulses bilaterally.  Abdomen Inspection Inspection of the abdomen reveals - No Hernias. Skin - Scar - no surgical scars. Palpation/Percussion Palpation and Percussion of the abdomen reveal - Soft, Non Tender, No Rebound tenderness, No Rigidity (guarding) and No hepatosplenomegaly. Auscultation Auscultation of the abdomen reveals - Bowel sounds normal.  Neurologic - Did not examine.  Musculoskeletal - Did not examine.  Lymphatic Head & Neck  General Head & Neck  Lymphatics: Bilateral - Description - Normal. Axillary  General Axillary Region: Bilateral - Description - Normal. Tenderness - Non Tender. Femoral & Inguinal - Did not examine.    Assessment & Plan  PAPILLOMA OF LEFT BREAST (D24.2) Impression: I have reviewed the patient's mammograms, ultrasound, and pathology results. She has a left breast papilloma on biopsy with bloody nipple discharge and a strong family history of breast cancer. A radioactive seed guided left breast lumpectomy is strongly recommended to rule out malignancy. I have discussed the procedure with her in detail. I discussed the risks of the procedure as well. These risks include but are not limited to bleeding, infection, injury to surrounding structures, the need for further surgery malignancy is found, cardiopulmonary issues, postoperative recovery, etc. She understands and wished to proceed with surgery as soon as possible.   NIPPLE DISCHARGE, BLOODY (N64.52)

## 2018-11-10 ENCOUNTER — Ambulatory Visit (HOSPITAL_BASED_OUTPATIENT_CLINIC_OR_DEPARTMENT_OTHER)
Admission: RE | Admit: 2018-11-10 | Discharge: 2018-11-10 | Disposition: A | Discharge status: Home / Self Care | Payer: Medicare HMO | Attending: Surgery | Admitting: Surgery

## 2018-11-10 ENCOUNTER — Ambulatory Visit (HOSPITAL_BASED_OUTPATIENT_CLINIC_OR_DEPARTMENT_OTHER): Payer: Medicare HMO | Admitting: Anesthesiology

## 2018-11-10 ENCOUNTER — Encounter (HOSPITAL_BASED_OUTPATIENT_CLINIC_OR_DEPARTMENT_OTHER)
Admission: RE | Disposition: A | Discharge status: Home / Self Care | Payer: Self-pay | Source: Home / Self Care | Attending: Surgery

## 2018-11-10 ENCOUNTER — Encounter (HOSPITAL_BASED_OUTPATIENT_CLINIC_OR_DEPARTMENT_OTHER): Payer: Self-pay

## 2018-11-10 ENCOUNTER — Other Ambulatory Visit: Payer: Self-pay

## 2018-11-10 DIAGNOSIS — Z87891 Personal history of nicotine dependence: Secondary | ICD-10-CM | POA: Insufficient documentation

## 2018-11-10 DIAGNOSIS — I10 Essential (primary) hypertension: Secondary | ICD-10-CM | POA: Insufficient documentation

## 2018-11-10 DIAGNOSIS — N6452 Nipple discharge: Secondary | ICD-10-CM | POA: Diagnosis not present

## 2018-11-10 DIAGNOSIS — E7800 Pure hypercholesterolemia, unspecified: Secondary | ICD-10-CM | POA: Insufficient documentation

## 2018-11-10 DIAGNOSIS — Z803 Family history of malignant neoplasm of breast: Secondary | ICD-10-CM | POA: Insufficient documentation

## 2018-11-10 DIAGNOSIS — Z885 Allergy status to narcotic agent status: Secondary | ICD-10-CM | POA: Insufficient documentation

## 2018-11-10 DIAGNOSIS — Z7982 Long term (current) use of aspirin: Secondary | ICD-10-CM | POA: Insufficient documentation

## 2018-11-10 DIAGNOSIS — Z8249 Family history of ischemic heart disease and other diseases of the circulatory system: Secondary | ICD-10-CM | POA: Insufficient documentation

## 2018-11-10 DIAGNOSIS — Z7952 Long term (current) use of systemic steroids: Secondary | ICD-10-CM | POA: Diagnosis not present

## 2018-11-10 DIAGNOSIS — Z888 Allergy status to other drugs, medicaments and biological substances status: Secondary | ICD-10-CM | POA: Insufficient documentation

## 2018-11-10 DIAGNOSIS — Z886 Allergy status to analgesic agent status: Secondary | ICD-10-CM | POA: Insufficient documentation

## 2018-11-10 DIAGNOSIS — N6012 Diffuse cystic mastopathy of left breast: Secondary | ICD-10-CM | POA: Diagnosis not present

## 2018-11-10 DIAGNOSIS — Z7984 Long term (current) use of oral hypoglycemic drugs: Secondary | ICD-10-CM | POA: Diagnosis not present

## 2018-11-10 DIAGNOSIS — Z90710 Acquired absence of both cervix and uterus: Secondary | ICD-10-CM | POA: Diagnosis not present

## 2018-11-10 DIAGNOSIS — Z79899 Other long term (current) drug therapy: Secondary | ICD-10-CM | POA: Insufficient documentation

## 2018-11-10 DIAGNOSIS — D242 Benign neoplasm of left breast: Secondary | ICD-10-CM

## 2018-11-10 DIAGNOSIS — N6032 Fibrosclerosis of left breast: Secondary | ICD-10-CM | POA: Insufficient documentation

## 2018-11-10 DIAGNOSIS — E119 Type 2 diabetes mellitus without complications: Secondary | ICD-10-CM | POA: Insufficient documentation

## 2018-11-10 HISTORY — DX: Unspecified osteoarthritis, unspecified site: M19.90

## 2018-11-10 HISTORY — DX: Benign neoplasm of left breast: D24.2

## 2018-11-10 HISTORY — PX: BREAST LUMPECTOMY WITH RADIOACTIVE SEED LOCALIZATION: SHX6424

## 2018-11-10 LAB — GLUCOSE, CAPILLARY
Glucose-Capillary: 122 mg/dL — ABNORMAL HIGH (ref 70–99)
Glucose-Capillary: 122 mg/dL — ABNORMAL HIGH (ref 70–99)

## 2018-11-10 SURGERY — BREAST LUMPECTOMY WITH RADIOACTIVE SEED LOCALIZATION
Anesthesia: General | Site: Breast | Laterality: Left

## 2018-11-10 MED ORDER — CEFAZOLIN SODIUM-DEXTROSE 2-4 GM/100ML-% IV SOLN
2.0000 g | INTRAVENOUS | Status: AC
  Administered 2018-11-10: 08:00:00 2 g via INTRAVENOUS

## 2018-11-10 MED ORDER — BUPIVACAINE-EPINEPHRINE 0.5% -1:200000 IJ SOLN
INTRAMUSCULAR | Status: DC | PRN
  Administered 2018-11-10: 7 mL
  Administered 2018-11-10: 8 mL

## 2018-11-10 MED ORDER — LACTATED RINGERS IV SOLN
INTRAVENOUS | Status: DC
  Administered 2018-11-10: 07:00:00 via INTRAVENOUS

## 2018-11-10 MED ORDER — FENTANYL CITRATE (PF) 100 MCG/2ML IJ SOLN: 25.0000 ug | INTRAMUSCULAR | Status: DC | PRN

## 2018-11-10 MED ORDER — SCOPOLAMINE 1 MG/3DAYS TD PT72: 1.0000 | MEDICATED_PATCH | Freq: Once | TRANSDERMAL | Status: DC

## 2018-11-10 MED ORDER — ACETAMINOPHEN 10 MG/ML IV SOLN: 1000.0000 mg | Freq: Once | INTRAVENOUS | Status: DC | PRN

## 2018-11-10 MED ORDER — ACETAMINOPHEN 160 MG/5ML PO SOLN: 1000.0000 mg | Freq: Once | ORAL | Status: DC | PRN

## 2018-11-10 MED ORDER — CHLORHEXIDINE GLUCONATE CLOTH 2 % EX PADS: 6.0000 | MEDICATED_PAD | Freq: Once | CUTANEOUS | Status: DC

## 2018-11-10 MED ORDER — PROPOFOL 500 MG/50ML IV EMUL
INTRAVENOUS | Status: DC | PRN
  Administered 2018-11-10: 150 ug/kg/min via INTRAVENOUS

## 2018-11-10 MED ORDER — PROPOFOL 10 MG/ML IV BOLUS
INTRAVENOUS | Status: DC | PRN
  Administered 2018-11-10: 130 mg via INTRAVENOUS
  Administered 2018-11-10: 30 mg via INTRAVENOUS

## 2018-11-10 MED ORDER — DEXAMETHASONE SODIUM PHOSPHATE 10 MG/ML IJ SOLN
INTRAMUSCULAR | Status: AC
  Filled 2018-11-10: qty 1

## 2018-11-10 MED ORDER — PROPOFOL 10 MG/ML IV BOLUS
INTRAVENOUS | Status: AC
  Filled 2018-11-10: qty 20

## 2018-11-10 MED ORDER — OXYCODONE HCL 5 MG/5ML PO SOLN: 5.0000 mg | Freq: Once | ORAL | Status: DC | PRN

## 2018-11-10 MED ORDER — LIDOCAINE HCL (CARDIAC) PF 100 MG/5ML IV SOSY
PREFILLED_SYRINGE | INTRAVENOUS | Status: DC | PRN
  Administered 2018-11-10: 40 mg via INTRATRACHEAL

## 2018-11-10 MED ORDER — PROPOFOL 10 MG/ML IV BOLUS
INTRAVENOUS | Status: AC
  Filled 2018-11-10: qty 40

## 2018-11-10 MED ORDER — ONDANSETRON HCL 4 MG/2ML IJ SOLN
INTRAMUSCULAR | Status: AC
  Filled 2018-11-10: qty 2

## 2018-11-10 MED ORDER — GABAPENTIN 300 MG PO CAPS
300.0000 mg | ORAL_CAPSULE | ORAL | Status: AC
  Administered 2018-11-10: 300 mg via ORAL

## 2018-11-10 MED ORDER — OXYCODONE HCL 5 MG PO TABS: 5.0000 mg | ORAL_TABLET | Freq: Once | ORAL | Status: DC | PRN

## 2018-11-10 MED ORDER — FENTANYL CITRATE (PF) 100 MCG/2ML IJ SOLN
INTRAMUSCULAR | Status: AC
  Filled 2018-11-10: qty 2

## 2018-11-10 MED ORDER — CEFAZOLIN SODIUM-DEXTROSE 2-4 GM/100ML-% IV SOLN
INTRAVENOUS | Status: AC
  Filled 2018-11-10: qty 100

## 2018-11-10 MED ORDER — FENTANYL CITRATE (PF) 100 MCG/2ML IJ SOLN
50.0000 ug | INTRAMUSCULAR | Status: AC | PRN
  Administered 2018-11-10 (×4): 25 ug via INTRAVENOUS

## 2018-11-10 MED ORDER — LIDOCAINE 2% (20 MG/ML) 5 ML SYRINGE
INTRAMUSCULAR | Status: AC
  Filled 2018-11-10: qty 5

## 2018-11-10 MED ORDER — GABAPENTIN 300 MG PO CAPS
ORAL_CAPSULE | ORAL | Status: AC
  Filled 2018-11-10: qty 1

## 2018-11-10 MED ORDER — MIDAZOLAM HCL 2 MG/2ML IJ SOLN: 1.0000 mg | INTRAMUSCULAR | Status: DC | PRN

## 2018-11-10 MED ORDER — LIDOCAINE HCL (CARDIAC) PF 100 MG/5ML IV SOSY
PREFILLED_SYRINGE | INTRAVENOUS | Status: DC | PRN
  Administered 2018-11-10: 40 mg via INTRAVENOUS

## 2018-11-10 MED ORDER — TRAMADOL HCL 50 MG PO TABS: 50.0000 mg | ORAL_TABLET | Freq: Four times a day (QID) | ORAL | 0 refills | Status: DC | PRN

## 2018-11-10 MED ORDER — ACETAMINOPHEN 500 MG PO TABS: 1000.0000 mg | ORAL_TABLET | Freq: Once | ORAL | Status: DC | PRN

## 2018-11-10 MED ORDER — BUPIVACAINE-EPINEPHRINE (PF) 0.5% -1:200000 IJ SOLN
INTRAMUSCULAR | Status: AC
  Filled 2018-11-10: qty 120

## 2018-11-10 SURGICAL SUPPLY — 46 items
APPLIER CLIP 9.375 MED OPEN (MISCELLANEOUS)
BINDER BREAST LRG (GAUZE/BANDAGES/DRESSINGS) ×1 IMPLANT
BLADE HEX COATED 2.75 (ELECTRODE) ×2 IMPLANT
BLADE SURG 15 STRL LF DISP TIS (BLADE) ×1 IMPLANT
BLADE SURG 15 STRL SS (BLADE) ×1
CANISTER SUC SOCK COL 7IN (MISCELLANEOUS) IMPLANT
CANISTER SUCT 1200ML W/VALVE (MISCELLANEOUS) IMPLANT
CHLORAPREP W/TINT 26 (MISCELLANEOUS) ×2 IMPLANT
CLIP APPLIE 9.375 MED OPEN (MISCELLANEOUS) IMPLANT
CLIP VESOCCLUDE SM WIDE 6/CT (CLIP) ×1 IMPLANT
COVER BACK TABLE REUSABLE LG (DRAPES) ×2 IMPLANT
COVER MAYO STAND REUSABLE (DRAPES) ×2 IMPLANT
COVER PROBE W GEL 5X96 (DRAPES) ×2 IMPLANT
COVER WAND RF STERILE (DRAPES) IMPLANT
DECANTER SPIKE VIAL GLASS SM (MISCELLANEOUS) IMPLANT
DERMABOND ADVANCED (GAUZE/BANDAGES/DRESSINGS) ×1
DERMABOND ADVANCED .7 DNX12 (GAUZE/BANDAGES/DRESSINGS) ×1 IMPLANT
DRAPE LAPAROSCOPIC ABDOMINAL (DRAPES) ×2 IMPLANT
DRAPE UTILITY XL STRL (DRAPES) ×2 IMPLANT
ELECT REM PT RETURN 9FT ADLT (ELECTROSURGICAL) ×2
ELECTRODE REM PT RTRN 9FT ADLT (ELECTROSURGICAL) ×1 IMPLANT
GAUZE SPONGE 4X4 12PLY STRL LF (GAUZE/BANDAGES/DRESSINGS) IMPLANT
GLOVE BIO SURGEON STRL SZ7 (GLOVE) ×2 IMPLANT
GLOVE SURG SIGNA 7.5 PF LTX (GLOVE) ×1 IMPLANT
GLOVE SURG SS PI 6.5 STRL IVOR (GLOVE) ×1 IMPLANT
GOWN STRL REUS W/ TWL LRG LVL3 (GOWN DISPOSABLE) ×1 IMPLANT
GOWN STRL REUS W/ TWL XL LVL3 (GOWN DISPOSABLE) ×1 IMPLANT
GOWN STRL REUS W/TWL LRG LVL3 (GOWN DISPOSABLE) ×1
GOWN STRL REUS W/TWL XL LVL3 (GOWN DISPOSABLE) ×1
KIT MARKER MARGIN INK (KITS) ×2 IMPLANT
NDL HYPO 25X1 1.5 SAFETY (NEEDLE) ×1 IMPLANT
NEEDLE HYPO 25X1 1.5 SAFETY (NEEDLE) ×2 IMPLANT
NS IRRIG 1000ML POUR BTL (IV SOLUTION) IMPLANT
PACK BASIN DAY SURGERY FS (CUSTOM PROCEDURE TRAY) ×2 IMPLANT
PENCIL BUTTON HOLSTER BLD 10FT (ELECTRODE) ×2 IMPLANT
SLEEVE SCD COMPRESS KNEE MED (MISCELLANEOUS) ×2 IMPLANT
SPONGE LAP 4X18 RFD (DISPOSABLE) ×2 IMPLANT
SUT MNCRL AB 4-0 PS2 18 (SUTURE) ×2 IMPLANT
SUT SILK 2 0 SH (SUTURE) IMPLANT
SUT VIC AB 3-0 SH 27 (SUTURE) ×1
SUT VIC AB 3-0 SH 27X BRD (SUTURE) ×1 IMPLANT
SYR CONTROL 10ML LL (SYRINGE) ×2 IMPLANT
TOWEL GREEN STERILE FF (TOWEL DISPOSABLE) ×2 IMPLANT
TRAY FAXITRON CT DISP (TRAY / TRAY PROCEDURE) ×2 IMPLANT
TUBE CONNECTING 20X1/4 (TUBING) IMPLANT
YANKAUER SUCT BULB TIP NO VENT (SUCTIONS) ×1 IMPLANT

## 2018-11-10 NOTE — Op Note (Signed)
LEFT BREAST LUMPECTOMY WITH RADIOACTIVE SEED LOCALIZATION  Procedure Note  [name] 11/10/2018   Pre-op Diagnosis: LEFT BREAST PAPILLOMA WITH BLOODY NIPPLE DISCHARGE     Post-op Diagnosis: same  Procedure(s): LEFT BREAST LUMPECTOMY WITH RADIOACTIVE SEED LOCALIZATION  Surgeon(s): Coralie Keens, MD  Anesthesia: General  Staff:  Circulator: Glenna Fellows, RN Scrub Person: Alyson Ingles D  Estimated Blood Loss: Minimal               Specimens: sent to path  Indications: This is a 73 year old female who presented with left bloody nipple discharge.  She underwent biopsy of a small mass by radiology showing an intraductal papilloma.  The decision was made to proceed with a radioactive seed guided left breast lumpectomy  Procedure: The patient was brought to the operating room and identifies the correct patient.  She was placed supine on the operating room table and general anesthesia was induced.  Her left breast was then prepped and draped in usual sterile fashion.  Using the neoprobe, I located the area of the seed underneath the areola.  I anesthetized the lateral edge of the areola with Marcaine.  I then made an incision with a scalpel.  I dissected down to the area of the radioactive seed with the aid of the neoprobe.  This was fairly superficial.  I performed a lumpectomy with the cautery staying around the radioactive seed and easily identifiable previous biopsy site.  Once the specimen was removed, I marked all margins with marker pain, x-rayed the specimen, confirmed the seed was in the appropriate location in the specimen, and then sent to pathology for evaluation.  Hemostasis was then achieved with the cautery.  I placed a surgical clip in the biopsy cavity.  I then closed the subcutaneous tissue with interrupted 3-0 Vicryl sutures and closed the skin with a running 4-0 Monocryl.  Dermabond was then applied.  The patient tolerated the procedure well.  All the counts were  correct at the end of the procedure.  The patient was then extubated in the operating room and taken in a stable condition to the recovery room.          Coralie Keens   Date: 11/10/2018  Time: 7:57 AM

## 2018-11-10 NOTE — Interval H&P Note (Signed)
History and Physical Interval Note:no change in H and P  11/10/2018 7:02 AM  [place]  has presented today for surgery, with the diagnosis of LEFT BREAST PAPILLOMA WITH BLOODY NIPPLE DISCHARGE.  The various methods of treatment have been discussed with the patient and family. After consideration of risks, benefits and other options for treatment, the patient has consented to  Procedure(s): LEFT BREAST LUMPECTOMY WITH RADIOACTIVE SEED LOCALIZATION (Left) as a surgical intervention.  The patient's history has been reviewed, patient examined, no change in status, stable for surgery.  I have reviewed the patient's chart and labs.  Questions were answered to the patient's satisfaction.     Coralie Keens

## 2018-11-10 NOTE — Anesthesia Preprocedure Evaluation (Signed)
Anesthesia Evaluation  Patient identified by MRN, date of birth, ID band Patient awake    Reviewed: Allergy & Precautions, NPO status , Patient's Chart, lab work & pertinent test results  History of Anesthesia Complications Negative for: history of anesthetic complications  Airway Mallampati: II  TM Distance: >3 FB Neck ROM: Full    Dental  (+) Dental Advisory Given   Pulmonary former smoker,    breath sounds clear to auscultation       Cardiovascular hypertension, Pt. on medications  Rhythm:Regular     Neuro/Psych TIAnegative psych ROS   GI/Hepatic negative GI ROS, Neg liver ROS,   Endo/Other  diabetes, Type 2  Renal/GU      Musculoskeletal  (+) Arthritis ,   Abdominal   Peds  Hematology negative hematology ROS (+)   Anesthesia Other Findings Left ventricle: The cavity size was normal. There was mild   concentric hypertrophy. Systolic function was vigorous. The   estimated ejection fraction was in the range of 65% to 70%. Wall   motion was normal; there were no regional wall motion   abnormalities. Doppler parameters are consistent with abnormal   left ventricular relaxation (grade 1 diastolic dysfunction). - Mitral valve: There was mild regurgitation.  Reproductive/Obstetrics                             Anesthesia Physical Anesthesia Plan  ASA: III  Anesthesia Plan: General   Post-op Pain Management:    Induction: Intravenous  PONV Risk Score and Plan: 3 and TIVA, Propofol infusion, Dexamethasone, Treatment may vary due to age or medical condition and Ondansetron  Airway Management Planned: LMA  Additional Equipment: None  Intra-op Plan:   Post-operative Plan: Extubation in OR  Informed Consent: I have reviewed the patients History and Physical, chart, labs and discussed the procedure including the risks, benefits and alternatives for the proposed anesthesia with the  patient or authorized representative who has indicated his/her understanding and acceptance.     Dental advisory given  Plan Discussed with: CRNA and Surgeon  Anesthesia Plan Comments:         Anesthesia Quick Evaluation

## 2018-11-10 NOTE — Anesthesia Procedure Notes (Signed)
Procedure Name: LMA Insertion Performed by: Ivor Kishi M, CRNA Pre-anesthesia Checklist: Patient identified, Emergency Drugs available, Suction available, Patient being monitored and Timeout performed Patient Re-evaluated:Patient Re-evaluated prior to induction Oxygen Delivery Method: Circle system utilized Preoxygenation: Pre-oxygenation with 100% oxygen Induction Type: IV induction LMA: LMA inserted LMA Size: 4.0 Tube type: Oral Number of attempts: 1 Placement Confirmation: positive ETCO2,  CO2 detector and breath sounds checked- equal and bilateral Tube secured with: Tape Dental Injury: Teeth and Oropharynx as per pre-operative assessment        

## 2018-11-10 NOTE — Transfer of Care (Signed)
Immediate Anesthesia Transfer of Care Note  Patient: [name]  Procedure(s) Performed: LEFT BREAST LUMPECTOMY WITH RADIOACTIVE SEED LOCALIZATION (Left Breast)  Patient Location: PACU  Anesthesia Type:General  Level of Consciousness: awake, alert  and oriented  Airway & Oxygen Therapy: Patient Spontanous Breathing and Patient connected to face mask oxygen  Post-op Assessment: Report given to RN and Post -op Vital signs reviewed and stable  Post vital signs: Reviewed and stable  Last Vitals:  Vitals Value Taken Time  BP    Temp    Pulse 71 11/10/18 0759  Resp 14 11/10/18 0759  SpO2 100 % 11/10/18 0759  Vitals shown include unvalidated device data.  Last Pain:  Vitals:   11/10/18 0701  TempSrc: Oral  PainSc: 0-No pain         Complications: No apparent anesthesia complications

## 2018-11-10 NOTE — Discharge Instructions (Signed)
[place] Office Phone Number [phone]  BREAST BIOPSY/ PARTIAL MASTECTOMY: POST OP INSTRUCTIONS  Always review your discharge instruction sheet given to you by the facility where your surgery was performed.  IF YOU HAVE DISABILITY OR FAMILY LEAVE FORMS, YOU MUST BRING THEM TO THE OFFICE FOR PROCESSING.  DO NOT GIVE THEM TO YOUR DOCTOR.  1. A prescription for pain medication may be given to you upon discharge.  Take your pain medication as prescribed, if needed.  If narcotic pain medicine is not needed, then you may take acetaminophen (Tylenol) or ibuprofen (Advil) as needed. 2. Take your usually prescribed medications unless otherwise directed 3. If you need a refill on your pain medication, please contact your pharmacy.  They will contact our office to request authorization.  Prescriptions will not be filled after 5pm or on week-ends. 4. You should eat very light the first 24 hours after surgery, such as soup, crackers, pudding, etc.  Resume your normal diet the day after surgery. 5. Most patients will experience some swelling and bruising in the breast.  Ice packs and a good support bra will help.  Swelling and bruising can take several days to resolve.  6. It is common to experience some constipation if taking pain medication after surgery.  Increasing fluid intake and taking a stool softener will usually help or prevent this problem from occurring.  A mild laxative (Milk of Magnesia or Miralax) should be taken according to package directions if there are no bowel movements after 48 hours. 7. Unless discharge instructions indicate otherwise, you may remove your bandages 24-48 hours after surgery, and you may shower at that time.  You may have steri-strips (small skin tapes) in place directly over the incision.  These strips should be left on the skin for 7-10 days.  If your surgeon used skin glue on the incision, you may shower in 24 hours.  The glue will flake off over the  next 2-3 weeks.  Any sutures or staples will be removed at the office during your follow-up visit. 8. ACTIVITIES:  You may resume regular daily activities (gradually increasing) beginning the next day.  Wearing a good support bra or sports bra minimizes pain and swelling.  You may have sexual intercourse when it is comfortable. a. You may drive when you no longer are taking prescription pain medication, you can comfortably wear a seatbelt, and you can safely maneuver your car and apply brakes. b. RETURN TO WORK:  ______________________________________________________________________________________ 9. You should see your doctor in the office for a follow-up appointment approximately two weeks after your surgery.  Your doctors nurse will typically make your follow-up appointment when she calls you with your pathology report.  Expect your pathology report 2-3 business days after your surgery.  You may call to check if you do not hear from [place] after three days. 10. OTHER INSTRUCTIONS:OK TO SHOWER STARTING TOMORROW 11. ICE PACK, IBUPROFEN, TYLENOL ALSO FOR PAIN _______________________________________________________________________________________________ _____________________________________________________________________________________________________________________________________ _____________________________________________________________________________________________________________________________________ _____________________________________________________________________________________________________________________________________  WHEN TO CALL YOUR DOCTOR: 1. Fever over 101.0 2. Nausea and/or vomiting. 3. Extreme swelling or bruising. 4. Continued bleeding from incision. 5. Increased pain, redness, or drainage from the incision.  The clinic staff is available to answer your questions during regular business hours.  Please dont hesitate to call and ask to speak to one of the nurses  for clinical concerns.  If you have a medical emergency, go to the nearest emergency room or call 911.  A surgeon from [facility] is always on  call at the hospital.  For further questions, please visit [email or web address]    Post Anesthesia Home Care Instructions  Activity: Get plenty of rest for the remainder of the day. A responsible individual must stay with you for 24 hours following the procedure.  For the next 24 hours, DO NOT: -Drive a car -Paediatric nurse -Drink alcoholic beverages -Take any medication unless instructed by your physician -Make any legal decisions or sign important papers.  Meals: Start with liquid foods such as gelatin or soup. Progress to regular foods as tolerated. Avoid greasy, spicy, heavy foods. If nausea and/or vomiting occur, drink only clear liquids until the nausea and/or vomiting subsides. Call your physician if vomiting continues.  Special Instructions/Symptoms: Your throat may feel dry or sore from the anesthesia or the breathing tube placed in your throat during surgery. If this causes discomfort, gargle with warm salt water. The discomfort should disappear within 24 hours.  If you had a scopolamine patch placed behind your ear for the management of post- operative nausea and/or vomiting:  1. The medication in the patch is effective for 72 hours, after which it should be removed.  Wrap patch in a tissue and discard in the trash. Wash hands thoroughly with soap and water. 2. You may remove the patch earlier than 72 hours if you experience unpleasant side effects which may include dry mouth, dizziness or visual disturbances. 3. Avoid touching the patch. Wash your hands with soap and water after contact with the patch.

## 2018-11-11 ENCOUNTER — Encounter (HOSPITAL_BASED_OUTPATIENT_CLINIC_OR_DEPARTMENT_OTHER): Payer: Self-pay | Admitting: Surgery

## 2018-11-11 NOTE — Anesthesia Postprocedure Evaluation (Signed)
Anesthesia Post Note  Patient: [name]  Procedure(s) Performed: LEFT BREAST LUMPECTOMY WITH RADIOACTIVE SEED LOCALIZATION (Left Breast)     Patient location during evaluation: PACU Anesthesia Type: General Level of consciousness: awake and alert Pain management: pain level controlled Vital Signs Assessment: post-procedure vital signs reviewed and stable Respiratory status: spontaneous breathing, nonlabored ventilation, respiratory function stable and patient connected to nasal cannula oxygen Cardiovascular status: blood pressure returned to baseline and stable Postop Assessment: no apparent nausea or vomiting Anesthetic complications: no    Last Vitals:  Vitals:   11/10/18 0830 11/10/18 0852  BP: 133/77 (!) 128/52  Pulse: 65 68  Resp: 19 20  Temp:  36.7 C  SpO2: 95% 97%    Last Pain:  Vitals:   11/10/18 0852  TempSrc:   PainSc: 0-No pain                 Shataya Winkles

## 2018-11-24 DIAGNOSIS — E11319 Type 2 diabetes mellitus with unspecified diabetic retinopathy without macular edema: Secondary | ICD-10-CM | POA: Diagnosis not present

## 2018-11-24 DIAGNOSIS — M059 Rheumatoid arthritis with rheumatoid factor, unspecified: Secondary | ICD-10-CM | POA: Diagnosis not present

## 2018-11-24 DIAGNOSIS — E785 Hyperlipidemia, unspecified: Secondary | ICD-10-CM | POA: Diagnosis not present

## 2018-11-24 DIAGNOSIS — N183 Chronic kidney disease, stage 3 (moderate): Secondary | ICD-10-CM | POA: Diagnosis not present

## 2018-11-24 DIAGNOSIS — E1121 Type 2 diabetes mellitus with diabetic nephropathy: Secondary | ICD-10-CM | POA: Diagnosis not present

## 2018-11-24 DIAGNOSIS — I129 Hypertensive chronic kidney disease with stage 1 through stage 4 chronic kidney disease, or unspecified chronic kidney disease: Secondary | ICD-10-CM | POA: Diagnosis not present

## 2018-12-25 DIAGNOSIS — N183 Chronic kidney disease, stage 3 (moderate): Secondary | ICD-10-CM | POA: Diagnosis not present

## 2018-12-25 DIAGNOSIS — I129 Hypertensive chronic kidney disease with stage 1 through stage 4 chronic kidney disease, or unspecified chronic kidney disease: Secondary | ICD-10-CM | POA: Diagnosis not present

## 2018-12-25 DIAGNOSIS — E11319 Type 2 diabetes mellitus with unspecified diabetic retinopathy without macular edema: Secondary | ICD-10-CM | POA: Diagnosis not present

## 2018-12-25 DIAGNOSIS — E1121 Type 2 diabetes mellitus with diabetic nephropathy: Secondary | ICD-10-CM | POA: Diagnosis not present

## 2018-12-25 DIAGNOSIS — M059 Rheumatoid arthritis with rheumatoid factor, unspecified: Secondary | ICD-10-CM | POA: Diagnosis not present

## 2018-12-25 DIAGNOSIS — E785 Hyperlipidemia, unspecified: Secondary | ICD-10-CM | POA: Diagnosis not present

## 2019-01-01 DIAGNOSIS — J301 Allergic rhinitis due to pollen: Secondary | ICD-10-CM | POA: Diagnosis not present

## 2019-01-01 DIAGNOSIS — I129 Hypertensive chronic kidney disease with stage 1 through stage 4 chronic kidney disease, or unspecified chronic kidney disease: Secondary | ICD-10-CM | POA: Diagnosis not present

## 2019-01-01 DIAGNOSIS — N183 Chronic kidney disease, stage 3 (moderate): Secondary | ICD-10-CM | POA: Diagnosis not present

## 2019-01-01 DIAGNOSIS — M353 Polymyalgia rheumatica: Secondary | ICD-10-CM | POA: Diagnosis not present

## 2019-01-01 DIAGNOSIS — E559 Vitamin D deficiency, unspecified: Secondary | ICD-10-CM | POA: Diagnosis not present

## 2019-01-01 DIAGNOSIS — E11319 Type 2 diabetes mellitus with unspecified diabetic retinopathy without macular edema: Secondary | ICD-10-CM | POA: Diagnosis not present

## 2019-01-01 DIAGNOSIS — E785 Hyperlipidemia, unspecified: Secondary | ICD-10-CM | POA: Diagnosis not present

## 2019-01-01 DIAGNOSIS — M059 Rheumatoid arthritis with rheumatoid factor, unspecified: Secondary | ICD-10-CM | POA: Diagnosis not present

## 2019-01-01 DIAGNOSIS — E1121 Type 2 diabetes mellitus with diabetic nephropathy: Secondary | ICD-10-CM | POA: Diagnosis not present

## 2019-01-05 DIAGNOSIS — E113292 Type 2 diabetes mellitus with mild nonproliferative diabetic retinopathy without macular edema, left eye: Secondary | ICD-10-CM | POA: Diagnosis not present

## 2019-01-05 DIAGNOSIS — Z79899 Other long term (current) drug therapy: Secondary | ICD-10-CM | POA: Diagnosis not present

## 2019-01-05 DIAGNOSIS — H43813 Vitreous degeneration, bilateral: Secondary | ICD-10-CM | POA: Diagnosis not present

## 2019-01-05 DIAGNOSIS — H52203 Unspecified astigmatism, bilateral: Secondary | ICD-10-CM | POA: Diagnosis not present

## 2019-01-07 DIAGNOSIS — Z23 Encounter for immunization: Secondary | ICD-10-CM | POA: Diagnosis not present

## 2019-01-07 DIAGNOSIS — N183 Chronic kidney disease, stage 3 (moderate): Secondary | ICD-10-CM | POA: Diagnosis not present

## 2019-01-07 DIAGNOSIS — E1121 Type 2 diabetes mellitus with diabetic nephropathy: Secondary | ICD-10-CM | POA: Diagnosis not present

## 2019-01-18 DIAGNOSIS — Z794 Long term (current) use of insulin: Secondary | ICD-10-CM | POA: Diagnosis not present

## 2019-01-18 DIAGNOSIS — H6123 Impacted cerumen, bilateral: Secondary | ICD-10-CM | POA: Diagnosis not present

## 2019-01-18 DIAGNOSIS — I10 Essential (primary) hypertension: Secondary | ICD-10-CM | POA: Diagnosis not present

## 2019-01-18 DIAGNOSIS — R42 Dizziness and giddiness: Secondary | ICD-10-CM | POA: Diagnosis not present

## 2019-01-18 DIAGNOSIS — E119 Type 2 diabetes mellitus without complications: Secondary | ICD-10-CM | POA: Diagnosis not present

## 2019-01-19 DIAGNOSIS — M059 Rheumatoid arthritis with rheumatoid factor, unspecified: Secondary | ICD-10-CM | POA: Diagnosis not present

## 2019-01-19 DIAGNOSIS — E1121 Type 2 diabetes mellitus with diabetic nephropathy: Secondary | ICD-10-CM | POA: Diagnosis not present

## 2019-01-19 DIAGNOSIS — I129 Hypertensive chronic kidney disease with stage 1 through stage 4 chronic kidney disease, or unspecified chronic kidney disease: Secondary | ICD-10-CM | POA: Diagnosis not present

## 2019-01-19 DIAGNOSIS — E11319 Type 2 diabetes mellitus with unspecified diabetic retinopathy without macular edema: Secondary | ICD-10-CM | POA: Diagnosis not present

## 2019-01-19 DIAGNOSIS — E785 Hyperlipidemia, unspecified: Secondary | ICD-10-CM | POA: Diagnosis not present

## 2019-01-19 DIAGNOSIS — N183 Chronic kidney disease, stage 3 (moderate): Secondary | ICD-10-CM | POA: Diagnosis not present

## 2019-01-24 DIAGNOSIS — R69 Illness, unspecified: Secondary | ICD-10-CM | POA: Diagnosis not present

## 2019-02-03 DIAGNOSIS — E1121 Type 2 diabetes mellitus with diabetic nephropathy: Secondary | ICD-10-CM | POA: Diagnosis not present

## 2019-02-03 DIAGNOSIS — M353 Polymyalgia rheumatica: Secondary | ICD-10-CM | POA: Diagnosis not present

## 2019-02-03 DIAGNOSIS — Z86010 Personal history of colonic polyps: Secondary | ICD-10-CM | POA: Diagnosis not present

## 2019-02-03 DIAGNOSIS — M059 Rheumatoid arthritis with rheumatoid factor, unspecified: Secondary | ICD-10-CM | POA: Diagnosis not present

## 2019-02-04 DIAGNOSIS — R69 Illness, unspecified: Secondary | ICD-10-CM | POA: Diagnosis not present

## 2019-02-08 DIAGNOSIS — R69 Illness, unspecified: Secondary | ICD-10-CM | POA: Diagnosis not present

## 2019-02-19 DIAGNOSIS — N183 Chronic kidney disease, stage 3 unspecified: Secondary | ICD-10-CM | POA: Diagnosis not present

## 2019-02-19 DIAGNOSIS — E11319 Type 2 diabetes mellitus with unspecified diabetic retinopathy without macular edema: Secondary | ICD-10-CM | POA: Diagnosis not present

## 2019-02-19 DIAGNOSIS — M059 Rheumatoid arthritis with rheumatoid factor, unspecified: Secondary | ICD-10-CM | POA: Diagnosis not present

## 2019-02-19 DIAGNOSIS — E785 Hyperlipidemia, unspecified: Secondary | ICD-10-CM | POA: Diagnosis not present

## 2019-02-19 DIAGNOSIS — I129 Hypertensive chronic kidney disease with stage 1 through stage 4 chronic kidney disease, or unspecified chronic kidney disease: Secondary | ICD-10-CM | POA: Diagnosis not present

## 2019-02-19 DIAGNOSIS — E1121 Type 2 diabetes mellitus with diabetic nephropathy: Secondary | ICD-10-CM | POA: Diagnosis not present

## 2019-03-04 DIAGNOSIS — Z1159 Encounter for screening for other viral diseases: Secondary | ICD-10-CM | POA: Diagnosis not present

## 2019-03-09 DIAGNOSIS — K5730 Diverticulosis of large intestine without perforation or abscess without bleeding: Secondary | ICD-10-CM | POA: Diagnosis not present

## 2019-03-09 DIAGNOSIS — K640 First degree hemorrhoids: Secondary | ICD-10-CM | POA: Diagnosis not present

## 2019-03-09 DIAGNOSIS — Z86010 Personal history of colonic polyps: Secondary | ICD-10-CM | POA: Diagnosis not present

## 2019-03-09 DIAGNOSIS — K644 Residual hemorrhoidal skin tags: Secondary | ICD-10-CM | POA: Diagnosis not present

## 2019-04-01 DIAGNOSIS — M0579 Rheumatoid arthritis with rheumatoid factor of multiple sites without organ or systems involvement: Secondary | ICD-10-CM | POA: Diagnosis not present

## 2019-04-01 DIAGNOSIS — M353 Polymyalgia rheumatica: Secondary | ICD-10-CM | POA: Diagnosis not present

## 2019-04-01 DIAGNOSIS — Z79899 Other long term (current) drug therapy: Secondary | ICD-10-CM | POA: Diagnosis not present

## 2019-04-01 DIAGNOSIS — M2550 Pain in unspecified joint: Secondary | ICD-10-CM | POA: Diagnosis not present

## 2019-04-01 DIAGNOSIS — M150 Primary generalized (osteo)arthritis: Secondary | ICD-10-CM | POA: Diagnosis not present

## 2019-04-07 DIAGNOSIS — Z79899 Other long term (current) drug therapy: Secondary | ICD-10-CM | POA: Diagnosis not present

## 2019-04-07 DIAGNOSIS — M059 Rheumatoid arthritis with rheumatoid factor, unspecified: Secondary | ICD-10-CM | POA: Diagnosis not present

## 2019-04-07 DIAGNOSIS — E11319 Type 2 diabetes mellitus with unspecified diabetic retinopathy without macular edema: Secondary | ICD-10-CM | POA: Diagnosis not present

## 2019-04-07 DIAGNOSIS — R413 Other amnesia: Secondary | ICD-10-CM | POA: Diagnosis not present

## 2019-04-07 DIAGNOSIS — E559 Vitamin D deficiency, unspecified: Secondary | ICD-10-CM | POA: Diagnosis not present

## 2019-04-07 DIAGNOSIS — I129 Hypertensive chronic kidney disease with stage 1 through stage 4 chronic kidney disease, or unspecified chronic kidney disease: Secondary | ICD-10-CM | POA: Diagnosis not present

## 2019-04-07 DIAGNOSIS — E785 Hyperlipidemia, unspecified: Secondary | ICD-10-CM | POA: Diagnosis not present

## 2019-04-07 DIAGNOSIS — M353 Polymyalgia rheumatica: Secondary | ICD-10-CM | POA: Diagnosis not present

## 2019-04-07 DIAGNOSIS — N183 Chronic kidney disease, stage 3 unspecified: Secondary | ICD-10-CM | POA: Diagnosis not present

## 2019-04-07 DIAGNOSIS — E1121 Type 2 diabetes mellitus with diabetic nephropathy: Secondary | ICD-10-CM | POA: Diagnosis not present

## 2019-04-14 DIAGNOSIS — R69 Illness, unspecified: Secondary | ICD-10-CM | POA: Diagnosis not present

## 2019-04-19 DIAGNOSIS — R69 Illness, unspecified: Secondary | ICD-10-CM | POA: Diagnosis not present

## 2019-04-29 DIAGNOSIS — M059 Rheumatoid arthritis with rheumatoid factor, unspecified: Secondary | ICD-10-CM | POA: Diagnosis not present

## 2019-04-29 DIAGNOSIS — E785 Hyperlipidemia, unspecified: Secondary | ICD-10-CM | POA: Diagnosis not present

## 2019-04-29 DIAGNOSIS — E1121 Type 2 diabetes mellitus with diabetic nephropathy: Secondary | ICD-10-CM | POA: Diagnosis not present

## 2019-04-29 DIAGNOSIS — E11319 Type 2 diabetes mellitus with unspecified diabetic retinopathy without macular edema: Secondary | ICD-10-CM | POA: Diagnosis not present

## 2019-04-29 DIAGNOSIS — I129 Hypertensive chronic kidney disease with stage 1 through stage 4 chronic kidney disease, or unspecified chronic kidney disease: Secondary | ICD-10-CM | POA: Diagnosis not present

## 2019-05-11 DIAGNOSIS — Z7189 Other specified counseling: Secondary | ICD-10-CM | POA: Diagnosis not present

## 2019-05-11 DIAGNOSIS — B029 Zoster without complications: Secondary | ICD-10-CM | POA: Diagnosis not present

## 2019-05-11 DIAGNOSIS — L219 Seborrheic dermatitis, unspecified: Secondary | ICD-10-CM | POA: Diagnosis not present

## 2019-05-25 ENCOUNTER — Institutional Professional Consult (permissible substitution): Payer: Self-pay | Admitting: Neurology

## 2019-05-25 ENCOUNTER — Telehealth: Payer: Self-pay

## 2019-05-25 NOTE — Telephone Encounter (Signed)
Patient no show for new pt appt last seen 2017.

## 2019-05-28 DIAGNOSIS — M059 Rheumatoid arthritis with rheumatoid factor, unspecified: Secondary | ICD-10-CM | POA: Diagnosis not present

## 2019-05-28 DIAGNOSIS — E1121 Type 2 diabetes mellitus with diabetic nephropathy: Secondary | ICD-10-CM | POA: Diagnosis not present

## 2019-05-28 DIAGNOSIS — E785 Hyperlipidemia, unspecified: Secondary | ICD-10-CM | POA: Diagnosis not present

## 2019-05-28 DIAGNOSIS — E11319 Type 2 diabetes mellitus with unspecified diabetic retinopathy without macular edema: Secondary | ICD-10-CM | POA: Diagnosis not present

## 2019-05-28 DIAGNOSIS — I129 Hypertensive chronic kidney disease with stage 1 through stage 4 chronic kidney disease, or unspecified chronic kidney disease: Secondary | ICD-10-CM | POA: Diagnosis not present

## 2019-05-31 ENCOUNTER — Encounter: Payer: Self-pay | Admitting: Neurology

## 2019-05-31 DIAGNOSIS — L218 Other seborrheic dermatitis: Secondary | ICD-10-CM | POA: Diagnosis not present

## 2019-06-21 DIAGNOSIS — X32XXXD Exposure to sunlight, subsequent encounter: Secondary | ICD-10-CM | POA: Diagnosis not present

## 2019-06-21 DIAGNOSIS — L570 Actinic keratosis: Secondary | ICD-10-CM | POA: Diagnosis not present

## 2019-06-21 DIAGNOSIS — L820 Inflamed seborrheic keratosis: Secondary | ICD-10-CM | POA: Diagnosis not present

## 2019-06-21 DIAGNOSIS — L298 Other pruritus: Secondary | ICD-10-CM | POA: Diagnosis not present

## 2019-07-01 DIAGNOSIS — E11319 Type 2 diabetes mellitus with unspecified diabetic retinopathy without macular edema: Secondary | ICD-10-CM | POA: Diagnosis not present

## 2019-07-01 DIAGNOSIS — M353 Polymyalgia rheumatica: Secondary | ICD-10-CM | POA: Diagnosis not present

## 2019-07-01 DIAGNOSIS — E785 Hyperlipidemia, unspecified: Secondary | ICD-10-CM | POA: Diagnosis not present

## 2019-07-01 DIAGNOSIS — E1121 Type 2 diabetes mellitus with diabetic nephropathy: Secondary | ICD-10-CM | POA: Diagnosis not present

## 2019-07-01 DIAGNOSIS — L299 Pruritus, unspecified: Secondary | ICD-10-CM | POA: Diagnosis not present

## 2019-07-01 DIAGNOSIS — R413 Other amnesia: Secondary | ICD-10-CM | POA: Diagnosis not present

## 2019-07-01 DIAGNOSIS — N183 Chronic kidney disease, stage 3 unspecified: Secondary | ICD-10-CM | POA: Diagnosis not present

## 2019-07-01 DIAGNOSIS — I129 Hypertensive chronic kidney disease with stage 1 through stage 4 chronic kidney disease, or unspecified chronic kidney disease: Secondary | ICD-10-CM | POA: Diagnosis not present

## 2019-07-01 DIAGNOSIS — M059 Rheumatoid arthritis with rheumatoid factor, unspecified: Secondary | ICD-10-CM | POA: Diagnosis not present

## 2019-07-14 DIAGNOSIS — M150 Primary generalized (osteo)arthritis: Secondary | ICD-10-CM | POA: Diagnosis not present

## 2019-07-14 DIAGNOSIS — M2550 Pain in unspecified joint: Secondary | ICD-10-CM | POA: Diagnosis not present

## 2019-07-14 DIAGNOSIS — Z6825 Body mass index (BMI) 25.0-25.9, adult: Secondary | ICD-10-CM | POA: Diagnosis not present

## 2019-07-14 DIAGNOSIS — E663 Overweight: Secondary | ICD-10-CM | POA: Diagnosis not present

## 2019-07-14 DIAGNOSIS — M0579 Rheumatoid arthritis with rheumatoid factor of multiple sites without organ or systems involvement: Secondary | ICD-10-CM | POA: Diagnosis not present

## 2019-07-14 DIAGNOSIS — L299 Pruritus, unspecified: Secondary | ICD-10-CM | POA: Diagnosis not present

## 2019-07-14 DIAGNOSIS — M353 Polymyalgia rheumatica: Secondary | ICD-10-CM | POA: Diagnosis not present

## 2019-07-20 DIAGNOSIS — R69 Illness, unspecified: Secondary | ICD-10-CM | POA: Diagnosis not present

## 2019-08-02 ENCOUNTER — Other Ambulatory Visit: Payer: Self-pay

## 2019-08-02 ENCOUNTER — Telehealth: Payer: Self-pay | Admitting: Neurology

## 2019-08-02 ENCOUNTER — Encounter: Payer: Self-pay | Admitting: Neurology

## 2019-08-02 ENCOUNTER — Ambulatory Visit: Payer: PPO | Admitting: Neurology

## 2019-08-02 ENCOUNTER — Ambulatory Visit (INDEPENDENT_AMBULATORY_CARE_PROVIDER_SITE_OTHER): Payer: Medicare HMO | Admitting: Neurology

## 2019-08-02 VITALS — BP 140/73 | HR 64 | Temp 98.1°F | Ht 63.0 in | Wt 141.0 lb

## 2019-08-02 DIAGNOSIS — G3184 Mild cognitive impairment, so stated: Secondary | ICD-10-CM | POA: Diagnosis not present

## 2019-08-02 DIAGNOSIS — I6930 Unspecified sequelae of cerebral infarction: Secondary | ICD-10-CM | POA: Diagnosis not present

## 2019-08-02 DIAGNOSIS — R413 Other amnesia: Secondary | ICD-10-CM

## 2019-08-02 NOTE — Telephone Encounter (Signed)
[employer] medicare order sent to [facility]. They will obtain the auth and reach out to the patient to schedule.  °
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
## 2019-08-02 NOTE — Patient Instructions (Signed)
I had a long discussion with the patient with regards to her short-term memory difficulties and mild cognitive impairment and risk for Alzheimer's and answered questions.  Check memory panel labs, EEG and MRI scan of the brain.  I encouraged her to increase participation in cognitively challenging activities like solving crossword puzzles, playing bridge and sudoku.  We also discussed memory compensation strategies.  Continue aspirin for stroke prevention and maintain aggressive risk factor modification with strict control of hypertension with blood pressure goal below 130/90, lipids with LDL cholesterol goal below 70 mg percent and diabetes with hemoglobin A1c goal below 6.5%.  She was encouraged to eat a healthy diet with lots of fruits, vegetables, whole grains and cereals and to be active and exercise regularly.  She will return for follow-up in the future in 2 months or call earlier if necessary.  Memory Compensation Strategies  1. Use "WARM" strategy.  W= write it down  A= associate it  R= repeat it  M= make a mental note  2.   You can keep a Social worker.  Use a 3-ring notebook with sections for the following: calendar, important names and phone numbers,  medications, doctors' names/phone numbers, lists/reminders, and a section to journal what you did  each day.   3.    Use a calendar to write appointments down.  4.    Write yourself a schedule for the day.  This can be placed on the calendar or in a separate section of the Memory Notebook.  Keeping a  regular schedule can help memory.  5.    Use medication organizer with sections for each day or morning/evening pills.  You may need help loading it  6.    Keep a basket, or pegboard by the door.  Place items that you need to take out with you in the basket or on the pegboard.  You may also want to  include a message board for reminders.  7.    Use sticky notes.  Place sticky notes with reminders in a place where the task is  performed.  For example: " turn off the  stove" placed by the stove, "lock the door" placed on the door at eye level, " take your medications" on  the bathroom mirror or by the place where you normally take your medications.  8.    Use alarms/timers.  Use while cooking to remind yourself to check on food or as a reminder to take your medicine, or as a  reminder to make a call, or as a reminder to perform another task, etc.

## 2019-08-03 DIAGNOSIS — L298 Other pruritus: Secondary | ICD-10-CM | POA: Diagnosis not present

## 2019-08-03 DIAGNOSIS — D1801 Hemangioma of skin and subcutaneous tissue: Secondary | ICD-10-CM | POA: Diagnosis not present

## 2019-08-03 NOTE — Progress Notes (Signed)
Kindly inform the patient that lab work for reversible causes of memory loss was all normal

## 2019-08-04 DIAGNOSIS — R69 Illness, unspecified: Secondary | ICD-10-CM | POA: Diagnosis not present

## 2019-08-04 LAB — DEMENTIA PANEL
Homocysteine: 8.9 umol/L (ref 0.0–19.2)
RPR Ser Ql: NONREACTIVE
TSH: 2.26 u[IU]/mL (ref 0.450–4.500)
Vitamin B-12: 1421 pg/mL — ABNORMAL HIGH (ref 232–1245)

## 2019-08-07 DIAGNOSIS — E785 Hyperlipidemia, unspecified: Secondary | ICD-10-CM | POA: Diagnosis not present

## 2019-08-07 DIAGNOSIS — I10 Essential (primary) hypertension: Secondary | ICD-10-CM | POA: Diagnosis not present

## 2019-08-07 DIAGNOSIS — Z7984 Long term (current) use of oral hypoglycemic drugs: Secondary | ICD-10-CM | POA: Diagnosis not present

## 2019-08-07 DIAGNOSIS — E119 Type 2 diabetes mellitus without complications: Secondary | ICD-10-CM | POA: Diagnosis not present

## 2019-08-07 DIAGNOSIS — Z7982 Long term (current) use of aspirin: Secondary | ICD-10-CM | POA: Diagnosis not present

## 2019-08-07 DIAGNOSIS — R69 Illness, unspecified: Secondary | ICD-10-CM | POA: Diagnosis not present

## 2019-08-07 DIAGNOSIS — Z79899 Other long term (current) drug therapy: Secondary | ICD-10-CM | POA: Diagnosis not present

## 2019-08-07 DIAGNOSIS — J309 Allergic rhinitis, unspecified: Secondary | ICD-10-CM | POA: Diagnosis not present

## 2019-08-07 DIAGNOSIS — M1990 Unspecified osteoarthritis, unspecified site: Secondary | ICD-10-CM | POA: Diagnosis not present

## 2019-08-07 DIAGNOSIS — M069 Rheumatoid arthritis, unspecified: Secondary | ICD-10-CM | POA: Diagnosis not present

## 2019-08-19 DIAGNOSIS — R928 Other abnormal and inconclusive findings on diagnostic imaging of breast: Secondary | ICD-10-CM | POA: Diagnosis not present

## 2019-08-22 ENCOUNTER — Encounter (HOSPITAL_COMMUNITY): Payer: Self-pay

## 2019-08-22 ENCOUNTER — Emergency Department (HOSPITAL_COMMUNITY): Payer: Medicare HMO

## 2019-08-22 ENCOUNTER — Emergency Department (HOSPITAL_COMMUNITY)
Admission: EM | Admit: 2019-08-22 | Discharge: 2019-08-22 | Disposition: A | Discharge status: Home / Self Care | Payer: Medicare HMO | Attending: Emergency Medicine | Admitting: Emergency Medicine

## 2019-08-22 ENCOUNTER — Other Ambulatory Visit: Payer: Self-pay

## 2019-08-22 DIAGNOSIS — Y9301 Activity, walking, marching and hiking: Secondary | ICD-10-CM | POA: Diagnosis not present

## 2019-08-22 DIAGNOSIS — S60221A Contusion of right hand, initial encounter: Secondary | ICD-10-CM | POA: Insufficient documentation

## 2019-08-22 DIAGNOSIS — Z7982 Long term (current) use of aspirin: Secondary | ICD-10-CM | POA: Diagnosis not present

## 2019-08-22 DIAGNOSIS — I10 Essential (primary) hypertension: Secondary | ICD-10-CM | POA: Insufficient documentation

## 2019-08-22 DIAGNOSIS — S61411A Laceration without foreign body of right hand, initial encounter: Secondary | ICD-10-CM | POA: Insufficient documentation

## 2019-08-22 DIAGNOSIS — S60511A Abrasion of right hand, initial encounter: Secondary | ICD-10-CM | POA: Diagnosis not present

## 2019-08-22 DIAGNOSIS — S6991XA Unspecified injury of right wrist, hand and finger(s), initial encounter: Secondary | ICD-10-CM | POA: Diagnosis not present

## 2019-08-22 DIAGNOSIS — W01198A Fall on same level from slipping, tripping and stumbling with subsequent striking against other object, initial encounter: Secondary | ICD-10-CM | POA: Insufficient documentation

## 2019-08-22 DIAGNOSIS — Z8673 Personal history of transient ischemic attack (TIA), and cerebral infarction without residual deficits: Secondary | ICD-10-CM | POA: Diagnosis not present

## 2019-08-22 DIAGNOSIS — S63501A Unspecified sprain of right wrist, initial encounter: Secondary | ICD-10-CM | POA: Diagnosis not present

## 2019-08-22 DIAGNOSIS — Z87891 Personal history of nicotine dependence: Secondary | ICD-10-CM | POA: Diagnosis not present

## 2019-08-22 DIAGNOSIS — S65311A Laceration of deep palmar arch of right hand, initial encounter: Secondary | ICD-10-CM

## 2019-08-22 DIAGNOSIS — Y929 Unspecified place or not applicable: Secondary | ICD-10-CM | POA: Insufficient documentation

## 2019-08-22 DIAGNOSIS — Z7984 Long term (current) use of oral hypoglycemic drugs: Secondary | ICD-10-CM | POA: Diagnosis not present

## 2019-08-22 DIAGNOSIS — E119 Type 2 diabetes mellitus without complications: Secondary | ICD-10-CM | POA: Diagnosis not present

## 2019-08-22 DIAGNOSIS — Y999 Unspecified external cause status: Secondary | ICD-10-CM | POA: Insufficient documentation

## 2019-08-22 MED ORDER — TRAMADOL HCL 50 MG PO TABS: 50.0000 mg | ORAL_TABLET | Freq: Four times a day (QID) | ORAL | 0 refills | Status: DC | PRN

## 2019-08-22 MED ORDER — TETANUS-DIPHTH-ACELL PERTUSSIS 5-2.5-18.5 LF-MCG/0.5 IM SUSP
0.5000 mL | Freq: Once | INTRAMUSCULAR | Status: AC
  Administered 2019-08-22: 0.5 mL via INTRAMUSCULAR
  Filled 2019-08-22: qty 0.5

## 2019-08-22 MED ORDER — TRAMADOL HCL 50 MG PO TABS
50.0000 mg | ORAL_TABLET | Freq: Once | ORAL | Status: AC
  Administered 2019-08-22: 50 mg via ORAL
  Filled 2019-08-22: qty 1
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
## 2019-08-22 NOTE — ED Triage Notes (Addendum)
Pt reports R wrist pain after a fall this morning. She states that she hit her head and R wrist on the ground. Abrasion noted to nose. Denies LOC or head pain, but reports R wrist pain and laceration noted to the palm of her R hand. A&Ox4.

## 2019-08-22 NOTE — Discharge Instructions (Addendum)
There were no broken bones seen on the x-ray.  To treat the laceration on hand, which you have chosen to not have sutured, remove the bandage and soak in warm water 3 times a day for 30 minutes.  After that wash it well with soap and rinse with water.  Then apply a bandage, and continue this process until healed.  Wear the wrist splint, as needed for comfort.  Try using Tylenol for pain and only use the narcotic pain reliever if needed.

## 2019-08-22 NOTE — ED Notes (Signed)
Right hand laceration cleaned with wound cleaner, Vaseline dressing, and gauze applied, wrapped in coban and then ace bandage for wrist support. PT given wound care instructions.

## 2019-08-23 DIAGNOSIS — R69 Illness, unspecified: Secondary | ICD-10-CM | POA: Diagnosis not present

## 2019-08-25 ENCOUNTER — Ambulatory Visit: Payer: Medicare HMO | Admitting: Neurology

## 2019-08-25 DIAGNOSIS — R410 Disorientation, unspecified: Secondary | ICD-10-CM

## 2019-08-25 DIAGNOSIS — R413 Other amnesia: Secondary | ICD-10-CM

## 2019-08-31 ENCOUNTER — Telehealth: Payer: Self-pay | Admitting: Neurology

## 2019-08-31 NOTE — Telephone Encounter (Signed)
Patient called to check on her EEG results

## 2019-08-31 NOTE — Telephone Encounter (Signed)
Kindly inform the patient that EEG study was normal

## 2019-09-01 NOTE — Telephone Encounter (Signed)
I called pt that EEG was normal.PT verbalized understanding. 

## 2019-09-03 NOTE — Progress Notes (Signed)
Kindly inform the patient that EEG study was normal

## 2019-09-08 DIAGNOSIS — R69 Illness, unspecified: Secondary | ICD-10-CM | POA: Diagnosis not present

## 2019-09-08 DIAGNOSIS — M25531 Pain in right wrist: Secondary | ICD-10-CM | POA: Diagnosis not present

## 2019-09-15 DIAGNOSIS — M25531 Pain in right wrist: Secondary | ICD-10-CM | POA: Diagnosis not present

## 2019-09-20 DIAGNOSIS — M25531 Pain in right wrist: Secondary | ICD-10-CM | POA: Diagnosis not present

## 2019-09-30 DIAGNOSIS — S62101D Fracture of unspecified carpal bone, right wrist, subsequent encounter for fracture with routine healing: Secondary | ICD-10-CM | POA: Diagnosis not present

## 2019-09-30 DIAGNOSIS — M0579 Rheumatoid arthritis with rheumatoid factor of multiple sites without organ or systems involvement: Secondary | ICD-10-CM | POA: Diagnosis not present

## 2019-09-30 DIAGNOSIS — Z6823 Body mass index (BMI) 23.0-23.9, adult: Secondary | ICD-10-CM | POA: Diagnosis not present

## 2019-09-30 DIAGNOSIS — M2550 Pain in unspecified joint: Secondary | ICD-10-CM | POA: Diagnosis not present

## 2019-09-30 DIAGNOSIS — M353 Polymyalgia rheumatica: Secondary | ICD-10-CM | POA: Diagnosis not present

## 2019-09-30 DIAGNOSIS — M150 Primary generalized (osteo)arthritis: Secondary | ICD-10-CM | POA: Diagnosis not present

## 2019-10-11 ENCOUNTER — Ambulatory Visit: Payer: Medicare HMO | Admitting: Neurology

## 2019-10-11 ENCOUNTER — Encounter: Payer: Self-pay | Admitting: Neurology

## 2019-10-11 ENCOUNTER — Other Ambulatory Visit: Payer: Self-pay

## 2019-10-11 VITALS — BP 118/71 | HR 78 | Ht 63.0 in | Wt 137.0 lb

## 2019-10-11 DIAGNOSIS — G3184 Mild cognitive impairment, so stated: Secondary | ICD-10-CM

## 2019-10-11 DIAGNOSIS — M25531 Pain in right wrist: Secondary | ICD-10-CM | POA: Diagnosis not present

## 2019-10-11 DIAGNOSIS — G309 Alzheimer's disease, unspecified: Secondary | ICD-10-CM

## 2019-10-11 NOTE — Patient Instructions (Addendum)
I had a long discussion with the patient regarding her memory and mild cognitive difficulties and risk for Alzheimer's given her strong family history.  Recommend she continue fish oil and increase participation in cognitively challenging activities like solving crossword puzzles, playing bridge and sudoku.  We also discussed memory compensation strategies.  Check MRI scan of the brain with and without contrast for structural lesions.  She was also advised to consider possible participation in the [place] Early Dementia trial if interested and was given information to review and decide.  She will return for follow-up in the future in 6 months or call earlier if necessary  Memory Compensation Strategies  1. Use "WARM" strategy.  W= write it down  A= associate it  R= repeat it  M= make a mental note  2.   You can keep a Social worker.  Use a 3-ring notebook with sections for the following: calendar, important names and phone numbers,  medications, doctors' names/phone numbers, lists/reminders, and a section to journal what you did  each day.   3.    Use a calendar to write appointments down.  4.    Write yourself a schedule for the day.  This can be placed on the calendar or in a separate section of the Memory Notebook.  Keeping a  regular schedule can help memory.  5.    Use medication organizer with sections for each day or morning/evening pills.  You may need help loading it  6.    Keep a basket, or pegboard by the door.  Place items that you need to take out with you in the basket or on the pegboard.  You may also want to  include a message board for reminders.  7.    Use sticky notes.  Place sticky notes with reminders in a place where the task is performed.  For example: " turn off the  stove" placed by the stove, "lock the door" placed on the door at eye level, " take your medications" on  the bathroom mirror or by the place where you normally take your medications.  8.    Use  alarms/timers.  Use while cooking to remind yourself to check on food or as a reminder to take your medicine, or as a  reminder to make a call, or as a reminder to perform another task, etc.
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
## 2019-10-12 ENCOUNTER — Telehealth: Payer: Self-pay | Admitting: Neurology

## 2019-10-12 DIAGNOSIS — R69 Illness, unspecified: Secondary | ICD-10-CM | POA: Diagnosis not present

## 2019-10-12 NOTE — Telephone Encounter (Signed)
[employer] medicare order sent to [facility]. They will obtain the auth and reach out to the patient to schedule.  °

## 2019-10-15 DIAGNOSIS — M85851 Other specified disorders of bone density and structure, right thigh: Secondary | ICD-10-CM | POA: Diagnosis not present

## 2019-10-15 DIAGNOSIS — M85852 Other specified disorders of bone density and structure, left thigh: Secondary | ICD-10-CM | POA: Diagnosis not present

## 2019-10-15 DIAGNOSIS — Z780 Asymptomatic menopausal state: Secondary | ICD-10-CM | POA: Diagnosis not present

## 2019-10-18 DIAGNOSIS — M059 Rheumatoid arthritis with rheumatoid factor, unspecified: Secondary | ICD-10-CM | POA: Diagnosis not present

## 2019-10-18 DIAGNOSIS — E11319 Type 2 diabetes mellitus with unspecified diabetic retinopathy without macular edema: Secondary | ICD-10-CM | POA: Diagnosis not present

## 2019-10-18 DIAGNOSIS — E785 Hyperlipidemia, unspecified: Secondary | ICD-10-CM | POA: Diagnosis not present

## 2019-10-18 DIAGNOSIS — N183 Chronic kidney disease, stage 3 unspecified: Secondary | ICD-10-CM | POA: Diagnosis not present

## 2019-10-18 DIAGNOSIS — E1121 Type 2 diabetes mellitus with diabetic nephropathy: Secondary | ICD-10-CM | POA: Diagnosis not present

## 2019-10-18 DIAGNOSIS — I129 Hypertensive chronic kidney disease with stage 1 through stage 4 chronic kidney disease, or unspecified chronic kidney disease: Secondary | ICD-10-CM | POA: Diagnosis not present

## 2019-11-02 DIAGNOSIS — E785 Hyperlipidemia, unspecified: Secondary | ICD-10-CM | POA: Diagnosis not present

## 2019-11-02 DIAGNOSIS — E1121 Type 2 diabetes mellitus with diabetic nephropathy: Secondary | ICD-10-CM | POA: Diagnosis not present

## 2019-11-02 DIAGNOSIS — M67471 Ganglion, right ankle and foot: Secondary | ICD-10-CM | POA: Diagnosis not present

## 2019-11-02 DIAGNOSIS — H9202 Otalgia, left ear: Secondary | ICD-10-CM | POA: Diagnosis not present

## 2019-11-02 DIAGNOSIS — I129 Hypertensive chronic kidney disease with stage 1 through stage 4 chronic kidney disease, or unspecified chronic kidney disease: Secondary | ICD-10-CM | POA: Diagnosis not present

## 2019-11-02 DIAGNOSIS — M21611 Bunion of right foot: Secondary | ICD-10-CM | POA: Diagnosis not present

## 2019-11-02 DIAGNOSIS — E11319 Type 2 diabetes mellitus with unspecified diabetic retinopathy without macular edema: Secondary | ICD-10-CM | POA: Diagnosis not present

## 2019-11-02 DIAGNOSIS — G4700 Insomnia, unspecified: Secondary | ICD-10-CM | POA: Diagnosis not present

## 2019-11-02 DIAGNOSIS — N183 Chronic kidney disease, stage 3 unspecified: Secondary | ICD-10-CM | POA: Diagnosis not present

## 2019-11-15 ENCOUNTER — Other Ambulatory Visit: Payer: Self-pay

## 2019-11-15 ENCOUNTER — Ambulatory Visit
Admission: RE | Admit: 2019-11-15 | Discharge: 2019-11-15 | Disposition: A | Discharge status: Home / Self Care | Payer: Medicare HMO | Source: Ambulatory Visit | Attending: Neurology | Admitting: Neurology

## 2019-11-15 DIAGNOSIS — G309 Alzheimer's disease, unspecified: Secondary | ICD-10-CM | POA: Diagnosis not present

## 2019-11-15 MED ORDER — GADOBENATE DIMEGLUMINE 529 MG/ML IV SOLN
12.0000 mL | Freq: Once | INTRAVENOUS | Status: AC | PRN
  Administered 2019-11-15: 12 mL via INTRAVENOUS

## 2019-11-16 ENCOUNTER — Telehealth: Payer: Self-pay

## 2019-11-16 NOTE — Progress Notes (Signed)
Kindly inform the patient that MRI scan of the brain shows no new or worrisome findings.  Mild changes of hardening of the arteries and a small stroke on the left in the deep portion is noted.

## 2019-11-16 NOTE — Telephone Encounter (Signed)
I called pt. I discussed her MRI results with her. Pt verbalized understanding of results. Pt had no questions at this time but was encouraged to call back if questions arise.

## 2019-11-16 NOTE — Telephone Encounter (Signed)
-----   Message from Garvin Fila, MD sent at 11/16/2019  4:53 PM EDT ----- Mitchell Heir inform the patient that MRI scan of the brain shows no new or worrisome findings.  Mild changes of hardening of the arteries and a small stroke on the left in the deep portion is noted.

## 2019-12-10 DIAGNOSIS — M792 Neuralgia and neuritis, unspecified: Secondary | ICD-10-CM | POA: Diagnosis not present

## 2019-12-10 DIAGNOSIS — M21612 Bunion of left foot: Secondary | ICD-10-CM | POA: Diagnosis not present

## 2019-12-10 DIAGNOSIS — M21611 Bunion of right foot: Secondary | ICD-10-CM | POA: Diagnosis not present

## 2019-12-10 DIAGNOSIS — M79671 Pain in right foot: Secondary | ICD-10-CM | POA: Diagnosis not present

## 2019-12-13 DIAGNOSIS — R69 Illness, unspecified: Secondary | ICD-10-CM | POA: Diagnosis not present

## 2019-12-24 DIAGNOSIS — R69 Illness, unspecified: Secondary | ICD-10-CM | POA: Diagnosis not present

## 2020-01-04 DIAGNOSIS — R69 Illness, unspecified: Secondary | ICD-10-CM | POA: Diagnosis not present

## 2020-01-17 DIAGNOSIS — H524 Presbyopia: Secondary | ICD-10-CM | POA: Diagnosis not present

## 2020-01-17 DIAGNOSIS — H43813 Vitreous degeneration, bilateral: Secondary | ICD-10-CM | POA: Diagnosis not present

## 2020-01-17 DIAGNOSIS — Z79899 Other long term (current) drug therapy: Secondary | ICD-10-CM | POA: Diagnosis not present

## 2020-01-17 DIAGNOSIS — E113212 Type 2 diabetes mellitus with mild nonproliferative diabetic retinopathy with macular edema, left eye: Secondary | ICD-10-CM | POA: Diagnosis not present

## 2020-02-01 ENCOUNTER — Ambulatory Visit: Payer: Medicare HMO | Attending: Internal Medicine

## 2020-02-01 DIAGNOSIS — Z961 Presence of intraocular lens: Secondary | ICD-10-CM | POA: Diagnosis not present

## 2020-02-01 DIAGNOSIS — E113212 Type 2 diabetes mellitus with mild nonproliferative diabetic retinopathy with macular edema, left eye: Secondary | ICD-10-CM | POA: Diagnosis not present

## 2020-02-01 DIAGNOSIS — H3562 Retinal hemorrhage, left eye: Secondary | ICD-10-CM | POA: Diagnosis not present

## 2020-02-01 DIAGNOSIS — Z23 Encounter for immunization: Secondary | ICD-10-CM

## 2020-02-01 DIAGNOSIS — H35033 Hypertensive retinopathy, bilateral: Secondary | ICD-10-CM | POA: Diagnosis not present

## 2020-02-01 DIAGNOSIS — H3509 Other intraretinal microvascular abnormalities: Secondary | ICD-10-CM | POA: Diagnosis not present

## 2020-02-01 NOTE — Progress Notes (Signed)
   Covid-19 Vaccination Clinic  Name:  [name]    MRN: [id number] DOB: Sep 01, 1945  02/01/2020  [name] was observed post Covid-19 immunization for 15 minutes without incident. She was provided with Vaccine Information Sheet and instruction to access the V-Safe system.   [name] was instructed to call 911 with any severe reactions post vaccine: Marland Kitchen Difficulty breathing  . Swelling of face and throat  . A fast heartbeat  . A bad rash all over body  . Dizziness and weakness

## 2020-02-21 DIAGNOSIS — R29818 Other symptoms and signs involving the nervous system: Secondary | ICD-10-CM | POA: Diagnosis not present

## 2020-02-21 DIAGNOSIS — E11319 Type 2 diabetes mellitus with unspecified diabetic retinopathy without macular edema: Secondary | ICD-10-CM | POA: Diagnosis not present

## 2020-02-21 DIAGNOSIS — Z1159 Encounter for screening for other viral diseases: Secondary | ICD-10-CM | POA: Diagnosis not present

## 2020-02-21 DIAGNOSIS — N183 Chronic kidney disease, stage 3 unspecified: Secondary | ICD-10-CM | POA: Diagnosis not present

## 2020-02-21 DIAGNOSIS — Z0000 Encounter for general adult medical examination without abnormal findings: Secondary | ICD-10-CM | POA: Diagnosis not present

## 2020-02-21 DIAGNOSIS — E559 Vitamin D deficiency, unspecified: Secondary | ICD-10-CM | POA: Diagnosis not present

## 2020-02-21 DIAGNOSIS — E785 Hyperlipidemia, unspecified: Secondary | ICD-10-CM | POA: Diagnosis not present

## 2020-02-21 DIAGNOSIS — H9193 Unspecified hearing loss, bilateral: Secondary | ICD-10-CM | POA: Diagnosis not present

## 2020-02-21 DIAGNOSIS — I129 Hypertensive chronic kidney disease with stage 1 through stage 4 chronic kidney disease, or unspecified chronic kidney disease: Secondary | ICD-10-CM | POA: Diagnosis not present

## 2020-02-21 DIAGNOSIS — E1121 Type 2 diabetes mellitus with diabetic nephropathy: Secondary | ICD-10-CM | POA: Diagnosis not present

## 2020-02-23 DIAGNOSIS — E11319 Type 2 diabetes mellitus with unspecified diabetic retinopathy without macular edema: Secondary | ICD-10-CM | POA: Diagnosis not present

## 2020-02-23 DIAGNOSIS — N183 Chronic kidney disease, stage 3 unspecified: Secondary | ICD-10-CM | POA: Diagnosis not present

## 2020-02-23 DIAGNOSIS — E1121 Type 2 diabetes mellitus with diabetic nephropathy: Secondary | ICD-10-CM | POA: Diagnosis not present

## 2020-02-23 DIAGNOSIS — E785 Hyperlipidemia, unspecified: Secondary | ICD-10-CM | POA: Diagnosis not present

## 2020-02-23 DIAGNOSIS — I129 Hypertensive chronic kidney disease with stage 1 through stage 4 chronic kidney disease, or unspecified chronic kidney disease: Secondary | ICD-10-CM | POA: Diagnosis not present

## 2020-02-23 DIAGNOSIS — M059 Rheumatoid arthritis with rheumatoid factor, unspecified: Secondary | ICD-10-CM | POA: Diagnosis not present

## 2020-02-28 DIAGNOSIS — H35072 Retinal telangiectasis, left eye: Secondary | ICD-10-CM | POA: Diagnosis not present

## 2020-02-28 DIAGNOSIS — Z961 Presence of intraocular lens: Secondary | ICD-10-CM | POA: Diagnosis not present

## 2020-02-28 DIAGNOSIS — H3509 Other intraretinal microvascular abnormalities: Secondary | ICD-10-CM | POA: Diagnosis not present

## 2020-02-28 DIAGNOSIS — H35352 Cystoid macular degeneration, left eye: Secondary | ICD-10-CM | POA: Diagnosis not present

## 2020-03-07 DIAGNOSIS — R3912 Poor urinary stream: Secondary | ICD-10-CM | POA: Diagnosis not present

## 2020-03-07 DIAGNOSIS — R350 Frequency of micturition: Secondary | ICD-10-CM | POA: Diagnosis not present

## 2020-03-07 DIAGNOSIS — N8111 Cystocele, midline: Secondary | ICD-10-CM | POA: Diagnosis not present

## 2020-03-08 DIAGNOSIS — E113212 Type 2 diabetes mellitus with mild nonproliferative diabetic retinopathy with macular edema, left eye: Secondary | ICD-10-CM | POA: Diagnosis not present

## 2020-03-08 DIAGNOSIS — H43813 Vitreous degeneration, bilateral: Secondary | ICD-10-CM | POA: Diagnosis not present

## 2020-03-08 DIAGNOSIS — E113291 Type 2 diabetes mellitus with mild nonproliferative diabetic retinopathy without macular edema, right eye: Secondary | ICD-10-CM | POA: Diagnosis not present

## 2020-03-09 DIAGNOSIS — I129 Hypertensive chronic kidney disease with stage 1 through stage 4 chronic kidney disease, or unspecified chronic kidney disease: Secondary | ICD-10-CM | POA: Diagnosis not present

## 2020-03-09 DIAGNOSIS — G4700 Insomnia, unspecified: Secondary | ICD-10-CM | POA: Diagnosis not present

## 2020-03-09 DIAGNOSIS — E11319 Type 2 diabetes mellitus with unspecified diabetic retinopathy without macular edema: Secondary | ICD-10-CM | POA: Diagnosis not present

## 2020-03-09 DIAGNOSIS — N183 Chronic kidney disease, stage 3 unspecified: Secondary | ICD-10-CM | POA: Diagnosis not present

## 2020-03-09 DIAGNOSIS — M059 Rheumatoid arthritis with rheumatoid factor, unspecified: Secondary | ICD-10-CM | POA: Diagnosis not present

## 2020-03-09 DIAGNOSIS — E1121 Type 2 diabetes mellitus with diabetic nephropathy: Secondary | ICD-10-CM | POA: Diagnosis not present

## 2020-03-09 DIAGNOSIS — E785 Hyperlipidemia, unspecified: Secondary | ICD-10-CM | POA: Diagnosis not present

## 2020-03-27 ENCOUNTER — Encounter: Payer: Self-pay | Admitting: Neurology

## 2020-03-27 ENCOUNTER — Ambulatory Visit: Payer: Medicare HMO | Admitting: Neurology

## 2020-03-27 VITALS — BP 155/73 | HR 68 | Ht 63.0 in | Wt 139.2 lb

## 2020-03-27 DIAGNOSIS — G3184 Mild cognitive impairment, so stated: Secondary | ICD-10-CM

## 2020-03-27 DIAGNOSIS — R413 Other amnesia: Secondary | ICD-10-CM | POA: Diagnosis not present

## 2020-03-27 NOTE — Patient Instructions (Signed)
I had a long discussion with the patient regarding her memory loss and mild cognitive impairment and discussed results of MRI scan, EEG and lab work and answered questions.  I recommend she continue taking fish oil as well as increase participation in cognitively challenging activities like solving crossword puzzles, playing bridge and sodoku.  We also discussed memory compensation strategies.  She will return for follow-up in the future in 6 months with my nurse practitioner Janett Billow or call earlier if necessary. Memory Compensation Strategies  1. Use "WARM" strategy.  W= write it down  A= associate it  R= repeat it  M= make a mental note  2.   You can keep a Social worker.  Use a 3-ring notebook with sections for the following: calendar, important names and phone numbers,  medications, doctors' names/phone numbers, lists/reminders, and a section to journal what you did  each day.   3.    Use a calendar to write appointments down.  4.    Write yourself a schedule for the day.  This can be placed on the calendar or in a separate section of the Memory Notebook.  Keeping a  regular schedule can help memory.  5.    Use medication organizer with sections for each day or morning/evening pills.  You may need help loading it  6.    Keep a basket, or pegboard by the door.  Place items that you need to take out with you in the basket or on the pegboard.  You may also want to  include a message board for reminders.  7.    Use sticky notes.  Place sticky notes with reminders in a place where the task is performed.  For example: " turn off the  stove" placed by the stove, "lock the door" placed on the door at eye level, " take your medications" on  the bathroom mirror or by the place where you normally take your medications.  8.    Use alarms/timers.  Use while cooking to remind yourself to check on food or as a reminder to take your medicine, or as a  reminder to make a call, or as a reminder to  perform another task, etc.
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
## 2020-03-28 DIAGNOSIS — R69 Illness, unspecified: Secondary | ICD-10-CM | POA: Diagnosis not present

## 2020-03-31 DIAGNOSIS — M150 Primary generalized (osteo)arthritis: Secondary | ICD-10-CM | POA: Diagnosis not present

## 2020-03-31 DIAGNOSIS — M720 Palmar fascial fibromatosis [Dupuytren]: Secondary | ICD-10-CM | POA: Diagnosis not present

## 2020-03-31 DIAGNOSIS — Z79899 Other long term (current) drug therapy: Secondary | ICD-10-CM | POA: Diagnosis not present

## 2020-03-31 DIAGNOSIS — M0579 Rheumatoid arthritis with rheumatoid factor of multiple sites without organ or systems involvement: Secondary | ICD-10-CM | POA: Diagnosis not present

## 2020-03-31 DIAGNOSIS — M353 Polymyalgia rheumatica: Secondary | ICD-10-CM | POA: Diagnosis not present

## 2020-03-31 DIAGNOSIS — Z6823 Body mass index (BMI) 23.0-23.9, adult: Secondary | ICD-10-CM | POA: Diagnosis not present

## 2020-03-31 DIAGNOSIS — M2550 Pain in unspecified joint: Secondary | ICD-10-CM | POA: Diagnosis not present

## 2020-04-24 DIAGNOSIS — M059 Rheumatoid arthritis with rheumatoid factor, unspecified: Secondary | ICD-10-CM | POA: Diagnosis not present

## 2020-04-24 DIAGNOSIS — G4700 Insomnia, unspecified: Secondary | ICD-10-CM | POA: Diagnosis not present

## 2020-04-24 DIAGNOSIS — E1121 Type 2 diabetes mellitus with diabetic nephropathy: Secondary | ICD-10-CM | POA: Diagnosis not present

## 2020-04-24 DIAGNOSIS — I129 Hypertensive chronic kidney disease with stage 1 through stage 4 chronic kidney disease, or unspecified chronic kidney disease: Secondary | ICD-10-CM | POA: Diagnosis not present

## 2020-04-24 DIAGNOSIS — E11319 Type 2 diabetes mellitus with unspecified diabetic retinopathy without macular edema: Secondary | ICD-10-CM | POA: Diagnosis not present

## 2020-04-24 DIAGNOSIS — E785 Hyperlipidemia, unspecified: Secondary | ICD-10-CM | POA: Diagnosis not present

## 2020-04-24 DIAGNOSIS — N183 Chronic kidney disease, stage 3 unspecified: Secondary | ICD-10-CM | POA: Diagnosis not present

## 2020-06-07 DIAGNOSIS — E113212 Type 2 diabetes mellitus with mild nonproliferative diabetic retinopathy with macular edema, left eye: Secondary | ICD-10-CM | POA: Diagnosis not present

## 2020-06-07 DIAGNOSIS — E113291 Type 2 diabetes mellitus with mild nonproliferative diabetic retinopathy without macular edema, right eye: Secondary | ICD-10-CM | POA: Diagnosis not present

## 2020-06-07 DIAGNOSIS — H43813 Vitreous degeneration, bilateral: Secondary | ICD-10-CM | POA: Diagnosis not present

## 2020-08-21 DIAGNOSIS — E11319 Type 2 diabetes mellitus with unspecified diabetic retinopathy without macular edema: Secondary | ICD-10-CM | POA: Diagnosis not present

## 2020-08-21 DIAGNOSIS — E1121 Type 2 diabetes mellitus with diabetic nephropathy: Secondary | ICD-10-CM | POA: Diagnosis not present

## 2020-08-21 DIAGNOSIS — N183 Chronic kidney disease, stage 3 unspecified: Secondary | ICD-10-CM | POA: Diagnosis not present

## 2020-08-21 DIAGNOSIS — I129 Hypertensive chronic kidney disease with stage 1 through stage 4 chronic kidney disease, or unspecified chronic kidney disease: Secondary | ICD-10-CM | POA: Diagnosis not present

## 2020-08-22 DIAGNOSIS — Z1231 Encounter for screening mammogram for malignant neoplasm of breast: Secondary | ICD-10-CM | POA: Diagnosis not present

## 2020-08-31 DIAGNOSIS — N8111 Cystocele, midline: Secondary | ICD-10-CM | POA: Diagnosis not present

## 2020-08-31 DIAGNOSIS — R350 Frequency of micturition: Secondary | ICD-10-CM | POA: Diagnosis not present

## 2020-09-26 ENCOUNTER — Ambulatory Visit: Payer: Medicare Other | Admitting: Adult Health

## 2020-09-26 ENCOUNTER — Encounter: Payer: Self-pay | Admitting: Adult Health

## 2020-09-26 VITALS — BP 136/67 | HR 65 | Ht 63.0 in | Wt 138.0 lb

## 2020-09-26 DIAGNOSIS — G3184 Mild cognitive impairment, so stated: Secondary | ICD-10-CM

## 2020-09-26 NOTE — Patient Instructions (Addendum)
Your Plan:  Continue omega-3 daily for cognitive impairment   Continue to follow up with PCP for aggressive stroke risk factor management    Follow up in 1 year or call earlier if needed     Thank you for coming to see [place] at [facility]. I hope we have been able to provide you high quality care today.  You may receive a patient satisfaction survey over the next few weeks. We would appreciate your feedback and comments so that we may continue to improve ourselves and the health of our patients.   Mild Neurocognitive Disorder Mild neurocognitive disorder, formerly known as mild cognitive impairment, is a disorder in which memory does not work as well as it should. This disorder may also cause problems with other mental functions, including thought, communication, behavior, and completion of tasks. These problems can be noticed and measured, but they usually do not interfere with daily activities or the ability to live independently. Mild neurocognitive disorder typically develops after 77 years of age, but it can also develop at younger ages. It is not as serious as major neurocognitive disorder, also known as dementia, but it may be the first sign of it. Generally, symptoms of this condition get worse over time. In rare cases, symptoms can get better. What are the causes? This condition may be caused by:  Brain disorders like Alzheimer's disease, Parkinson's disease, and other conditions that gradually damage nerve cells (neurodegenerative conditions).  Diseases that affect blood vessels in the brain and result in small strokes.  Certain infections, such as HIV.  Traumatic brain injury.  Other medical conditions, such as brain tumors, underactive thyroid (hypothyroidism), and vitamin B12 deficiency.  Use of certain drugs or prescription medicines. What increases the risk? The following factors may make you more likely to develop this condition:  Being older  than 65 years.  Being female.  Low education level.  Diabetes, high blood pressure, high cholesterol, and other conditions that increase the risk for blood vessel diseases.  Untreated or undertreated sleep apnea.  Having a certain type of gene that can be passed from parent to child (inherited).  Chronic health problems such as heart disease, lung disease, liver disease, kidney disease, or depression. What are the signs or symptoms? Symptoms of this condition include:  Difficulty remembering. You may: ? Forget names, phone numbers, or details of recent events. ? Forget social events and appointments. ? Repeatedly forget where you put your car keys or other items.  Difficulty thinking and solving problems. You may have trouble with complex tasks, such as: ? Paying bills. ? Driving in unfamiliar places.  Difficulty communicating. You may have trouble: ? Finding the right word or naming an object. ? Forming a sentence that makes sense, or understanding what you read or hear.  Changes in your behavior or personality. When this happens, you may: ? Lose interest in the things that you used to enjoy. ? Withdraw from social situations. ? Get angry more easily than usual. ? Act before thinking. How is this diagnosed? This condition is diagnosed based on:  Your symptoms. Your health care provider may ask you and the people you spend time with, such as family and friends, about your symptoms.  Evaluation of mental functions (neuropsychological testing). Your health care provider may refer you to a neurologist or mental health specialist to evaluate your mental functions in detail. To identify the cause of your condition, your health care provider may:  Get a detailed medical history.  Ask about use of alcohol, drugs, and prescription medicines.  Do a physical exam.  Order blood tests and brain imaging exams. How is this treated? Mild neurocognitive disorder that is caused by  medicine use, drug use, infection, or another medical condition may improve when the cause is treated, or when medicines or drugs are stopped. If this disorder has another cause, it generally does not improve and may get worse. In these cases, the goal of treatment is to help you manage the loss of mental function. Treatments in these cases include:  Medicine. Medicine mainly helps memory and behavior symptoms.  Talk therapy. Talk therapy provides education, emotional support, memory aids, and other ways of making up for problems with mental function.  Lifestyle changes, including: ? Getting regular exercise. ? Eating a healthy diet that includes omega-3 fatty acids. ? Challenging your thinking and memory skills. ? Having more social interaction. Follow these instructions at home: Eating and drinking  Drink enough fluid to keep your urine pale yellow.  Eat a healthy diet that includes omega-3 fatty acids. These can be found in: ? Fish. ? Nuts. ? Leafy vegetables. ? Vegetable oils.  If you drink alcohol: ? Limit how much you use to:  0-1 drink a day for women.  0-2 drinks a day for men. ? Be aware of how much alcohol is in your drink. In the [place] one drink equals one 12 oz bottle of beer (355 mL), one 5 oz glass of wine (148 mL), or one 1 oz glass of hard liquor (44 mL).   Lifestyle  Get regular exercise as told by your health care provider.  Do not use any products that contain nicotine or tobacco, such as cigarettes, e-cigarettes, and chewing tobacco. If you need help quitting, ask your health care provider.  Practice ways to manage stress. If you need help managing stress, ask your health care provider.  Continue to have social interaction.  Keep your mind active with stimulating activities you enjoy, such as reading or playing games.  Make sure to get quality sleep. Follow these tips: ? Avoid napping during the day. ? Keep your sleeping area dark and cool. ? Avoid  exercising during the few hours before you go to bed. ? Avoid caffeine products in the evening.   General instructions  Take over-the-counter and prescription medicines only as told by your health care provider. Your health care provider may recommend that you avoid taking medicines that can affect thinking, such as pain medicines or sleep medicines.  Work with your health care provider to find out what you need help with and what your safety needs are.  Keep all follow-up visits. This is important. Where to find more information  [employer] on Aging: http://kim-miller.com/ Contact a health care provider if:  You have any new symptoms. Get help right away if:  You develop new confusion or your confusion gets worse.  You act in ways that place you or your family in danger. Summary  Mild neurocognitive disorder is a disorder in which memory does not work as well as it should.  Mild neurocognitive disorder can have many causes. It may be the first stage of dementia.  To manage your condition, get regular exercise, keep your mind active, get quality sleep, and eat a healthy diet. This information is not intended to replace advice given to you by your health care provider. Make sure you discuss any questions you have with your health care provider. Document Revised: 08/30/2019 Document  Reviewed: 08/30/2019 Elsevier Patient Education  [place].
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
## 2020-10-02 NOTE — Progress Notes (Signed)
I agree with the above plan 

## 2020-10-04 DIAGNOSIS — N183 Chronic kidney disease, stage 3 unspecified: Secondary | ICD-10-CM | POA: Diagnosis not present

## 2020-10-04 DIAGNOSIS — U071 COVID-19: Secondary | ICD-10-CM | POA: Diagnosis not present

## 2020-10-04 DIAGNOSIS — I129 Hypertensive chronic kidney disease with stage 1 through stage 4 chronic kidney disease, or unspecified chronic kidney disease: Secondary | ICD-10-CM | POA: Diagnosis not present

## 2020-10-04 DIAGNOSIS — E1121 Type 2 diabetes mellitus with diabetic nephropathy: Secondary | ICD-10-CM | POA: Diagnosis not present

## 2020-10-09 ENCOUNTER — Ambulatory Visit (HOSPITAL_COMMUNITY)
Admission: EM | Admit: 2020-10-09 | Discharge: 2020-10-09 | Disposition: A | Discharge status: Home / Self Care | Payer: Medicare Other | Attending: Emergency Medicine | Admitting: Emergency Medicine

## 2020-10-09 ENCOUNTER — Other Ambulatory Visit: Payer: Self-pay

## 2020-10-09 DIAGNOSIS — U071 COVID-19: Secondary | ICD-10-CM

## 2020-10-09 DIAGNOSIS — H60391 Other infective otitis externa, right ear: Secondary | ICD-10-CM | POA: Diagnosis not present

## 2020-10-09 MED ORDER — PSEUDOEPH-BROMPHEN-DM 30-2-10 MG/5ML PO SYRP: 10.0000 mL | ORAL_SOLUTION | Freq: Four times a day (QID) | ORAL | 0 refills | Status: DC | PRN

## 2020-10-09 MED ORDER — PREDNISONE 50 MG PO TABS: 50.0000 mg | ORAL_TABLET | Freq: Every day | ORAL | 0 refills | Status: DC

## 2020-10-09 MED ORDER — NEOMYCIN-POLYMYXIN-HC 1 % OT SOLN: 3.0000 [drp] | Freq: Four times a day (QID) | OTIC | 0 refills | Status: DC

## 2020-10-09 MED ORDER — ALBUTEROL SULFATE HFA 108 (90 BASE) MCG/ACT IN AERS: 2.0000 | INHALATION_SPRAY | RESPIRATORY_TRACT | 0 refills | Status: DC | PRN

## 2020-10-09 MED ORDER — BENZONATATE 100 MG PO CAPS
100.0000 mg | ORAL_CAPSULE | Freq: Three times a day (TID) | ORAL | 0 refills | Status: DC
Start: 2020-10-09 — End: 2021-05-21
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
## 2020-10-09 NOTE — ED Triage Notes (Addendum)
Right ear pain, that woke patient on Friday night.  Reports unable to hear in this ear and reports having bloody drainage from right .  Positive covid test Symptoms started on 6/3  Complains of cough

## 2020-10-16 DIAGNOSIS — H66001 Acute suppurative otitis media without spontaneous rupture of ear drum, right ear: Secondary | ICD-10-CM | POA: Diagnosis not present

## 2020-10-16 DIAGNOSIS — Z8616 Personal history of COVID-19: Secondary | ICD-10-CM | POA: Diagnosis not present

## 2020-10-20 DIAGNOSIS — H906 Mixed conductive and sensorineural hearing loss, bilateral: Secondary | ICD-10-CM | POA: Diagnosis not present

## 2020-10-20 DIAGNOSIS — H6501 Acute serous otitis media, right ear: Secondary | ICD-10-CM | POA: Diagnosis not present

## 2020-10-20 DIAGNOSIS — J3489 Other specified disorders of nose and nasal sinuses: Secondary | ICD-10-CM | POA: Diagnosis not present

## 2020-10-20 DIAGNOSIS — H6983 Other specified disorders of Eustachian tube, bilateral: Secondary | ICD-10-CM | POA: Diagnosis not present

## 2020-10-23 DIAGNOSIS — I129 Hypertensive chronic kidney disease with stage 1 through stage 4 chronic kidney disease, or unspecified chronic kidney disease: Secondary | ICD-10-CM | POA: Diagnosis not present

## 2020-10-23 DIAGNOSIS — E11319 Type 2 diabetes mellitus with unspecified diabetic retinopathy without macular edema: Secondary | ICD-10-CM | POA: Diagnosis not present

## 2020-10-23 DIAGNOSIS — N183 Chronic kidney disease, stage 3 unspecified: Secondary | ICD-10-CM | POA: Diagnosis not present

## 2020-10-23 DIAGNOSIS — E1121 Type 2 diabetes mellitus with diabetic nephropathy: Secondary | ICD-10-CM | POA: Diagnosis not present

## 2020-11-01 IMAGING — CR DG WRIST COMPLETE 3+V*R*
1 series · 1 of 1 positions shown · non-contrast
Comparison: None.

CLINICAL DATA: Status post fall.

EXAM:
RIGHT WRIST - COMPLETE 3+ VIEW

[x wrist navicular view right]
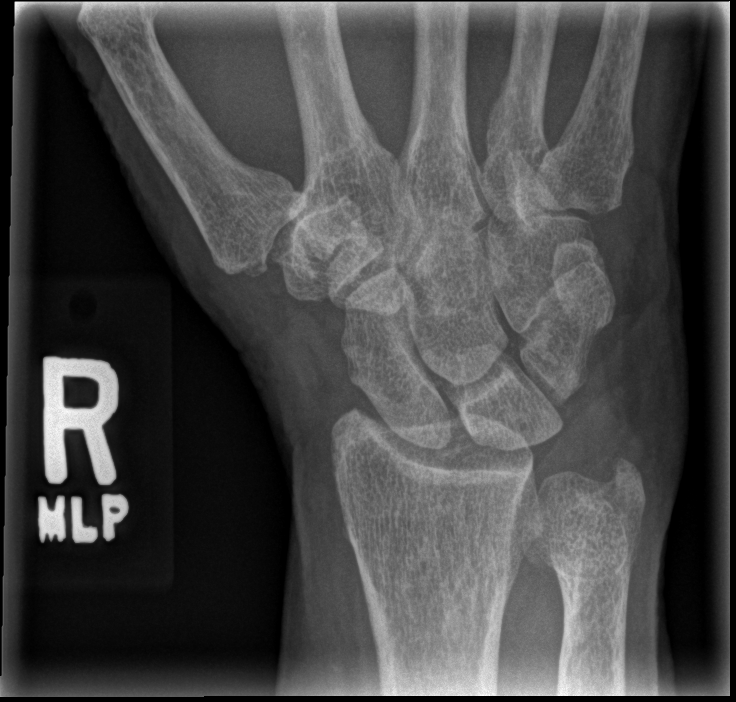

[1 of 1 positions shown; findings below may reference images not displayed]

FINDINGS: There is no evidence of fracture or dislocation. Mild degenerative
changes seen along the carpometacarpal articulation of the right
thumb. Soft tissues are unremarkable.
IMPRESSION: Degenerative changes without evidence of an acute osseous
abnormality.

## 2020-11-01 IMAGING — CR DG HAND COMPLETE 3+V*R*
3 series · 3 of 3 positions shown · non-contrast
Comparison: None.

CLINICAL DATA: Right wrist and hand pain, abrasion

EXAM:
RIGHT HAND - COMPLETE 3+ VIEW

[x hand pa right]
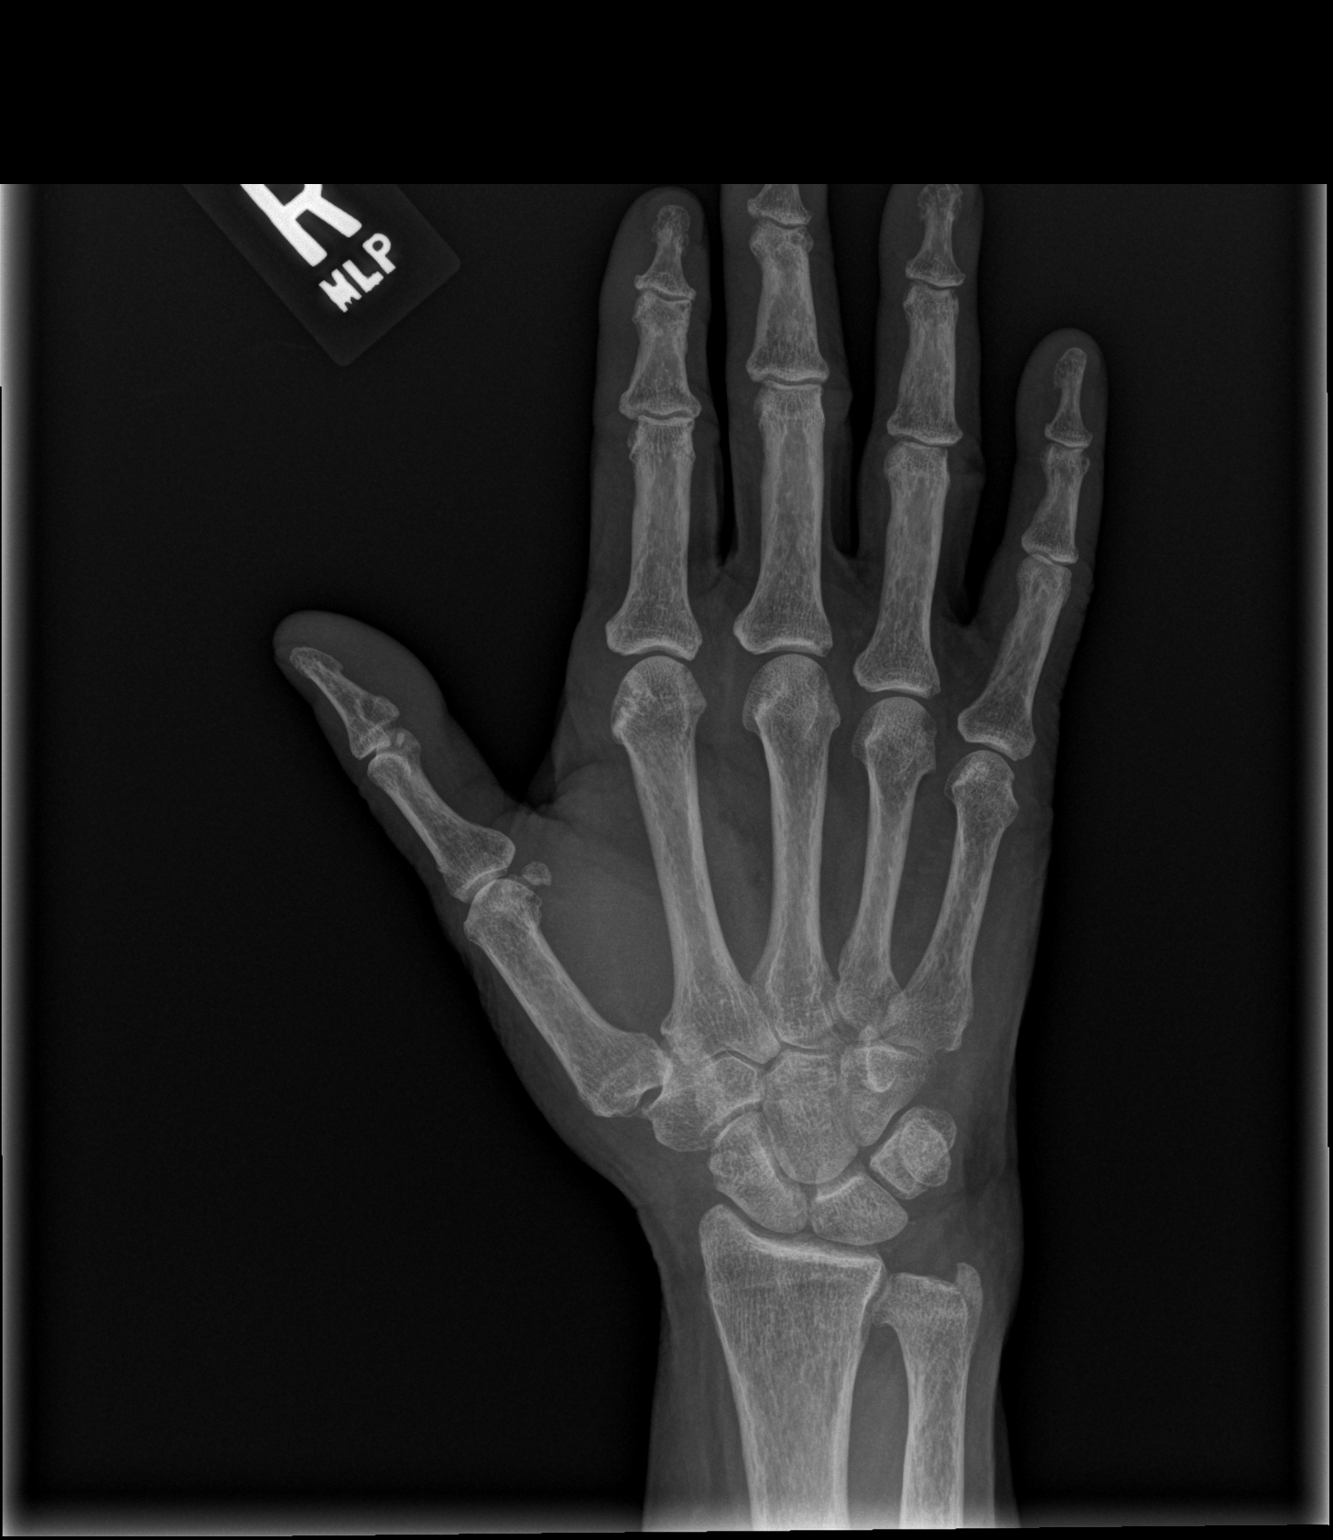

[x hand obl right]
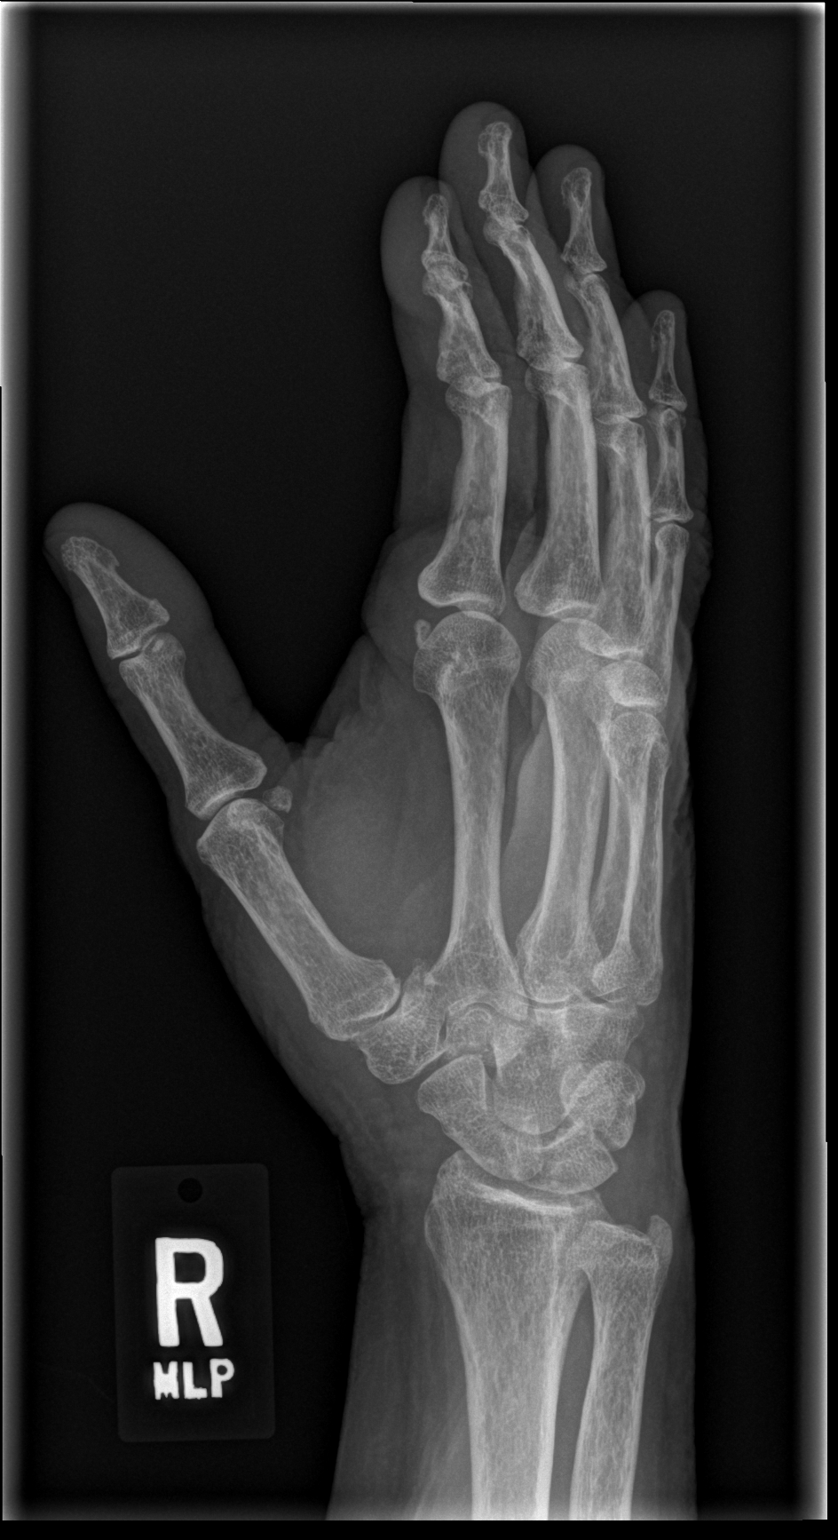

[x hand lat right]
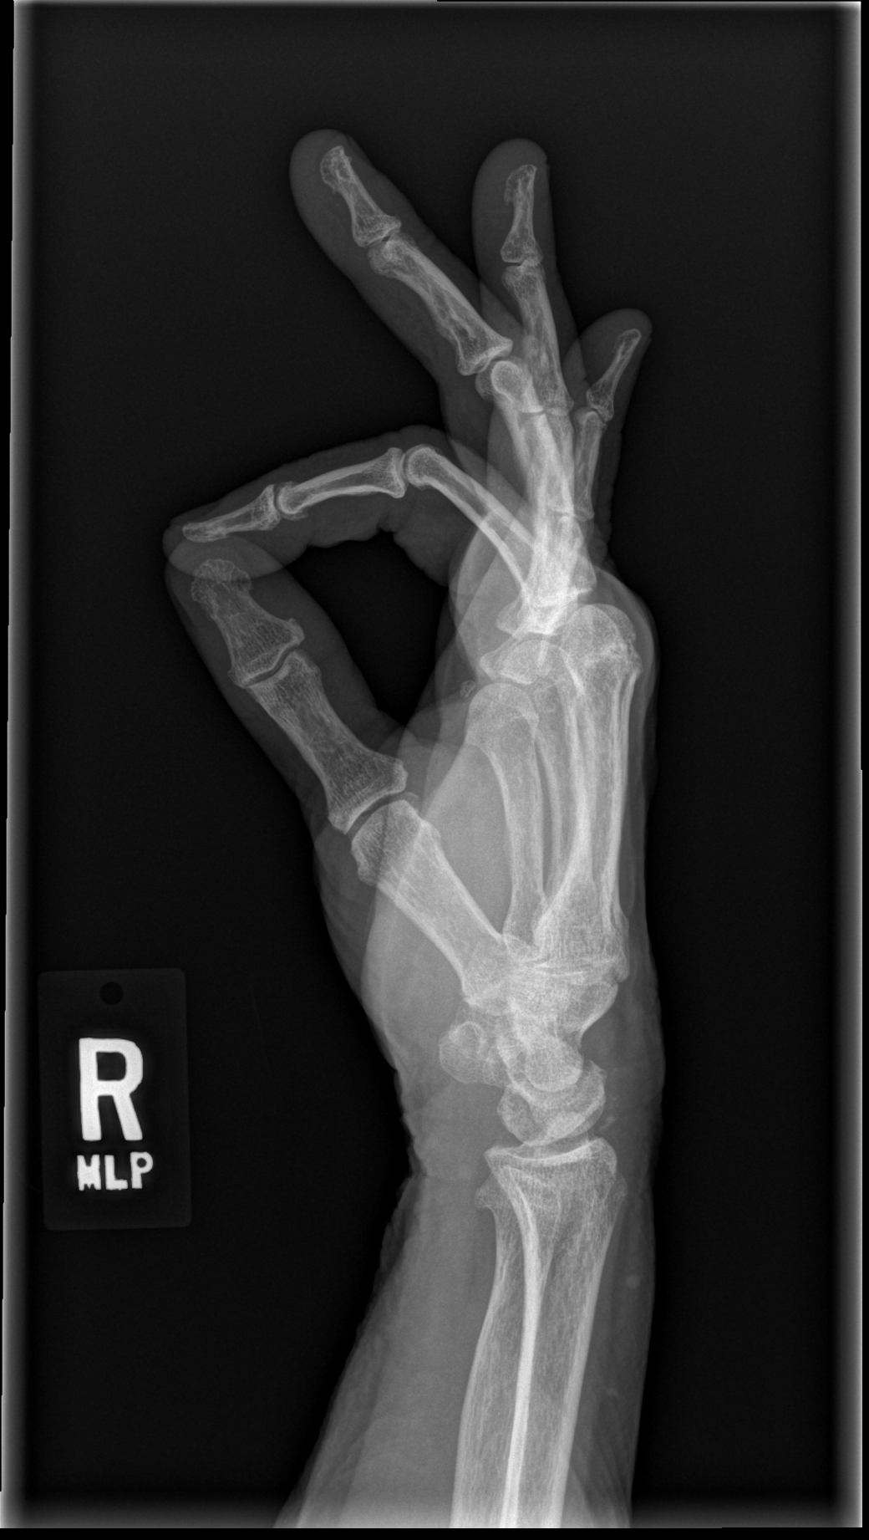

[3 of 3 positions shown; findings below may reference images not displayed]

FINDINGS: Frontal, oblique, lateral views of the right hand are obtained. No
acute displaced fracture. Alignment is anatomic. Mild diffuse
interphalangeal joint space narrowing compatible with
osteoarthritis. Soft tissues are unremarkable.
IMPRESSION: 1. Mild osteoarthritis.  No acute bony abnormality.

## 2020-11-10 DIAGNOSIS — H6501 Acute serous otitis media, right ear: Secondary | ICD-10-CM | POA: Diagnosis not present

## 2020-11-10 DIAGNOSIS — H6981 Other specified disorders of Eustachian tube, right ear: Secondary | ICD-10-CM | POA: Diagnosis not present

## 2020-11-17 DIAGNOSIS — M150 Primary generalized (osteo)arthritis: Secondary | ICD-10-CM | POA: Diagnosis not present

## 2020-11-17 DIAGNOSIS — M2550 Pain in unspecified joint: Secondary | ICD-10-CM | POA: Diagnosis not present

## 2020-11-17 DIAGNOSIS — M0579 Rheumatoid arthritis with rheumatoid factor of multiple sites without organ or systems involvement: Secondary | ICD-10-CM | POA: Diagnosis not present

## 2020-11-17 DIAGNOSIS — M353 Polymyalgia rheumatica: Secondary | ICD-10-CM | POA: Diagnosis not present

## 2020-12-05 DIAGNOSIS — H43813 Vitreous degeneration, bilateral: Secondary | ICD-10-CM | POA: Diagnosis not present

## 2020-12-05 DIAGNOSIS — E113293 Type 2 diabetes mellitus with mild nonproliferative diabetic retinopathy without macular edema, bilateral: Secondary | ICD-10-CM | POA: Diagnosis not present

## 2020-12-15 DIAGNOSIS — I129 Hypertensive chronic kidney disease with stage 1 through stage 4 chronic kidney disease, or unspecified chronic kidney disease: Secondary | ICD-10-CM | POA: Diagnosis not present

## 2020-12-15 DIAGNOSIS — E11319 Type 2 diabetes mellitus with unspecified diabetic retinopathy without macular edema: Secondary | ICD-10-CM | POA: Diagnosis not present

## 2020-12-15 DIAGNOSIS — N183 Chronic kidney disease, stage 3 unspecified: Secondary | ICD-10-CM | POA: Diagnosis not present

## 2020-12-15 DIAGNOSIS — E1121 Type 2 diabetes mellitus with diabetic nephropathy: Secondary | ICD-10-CM | POA: Diagnosis not present

## 2021-01-02 DIAGNOSIS — H9202 Otalgia, left ear: Secondary | ICD-10-CM | POA: Diagnosis not present

## 2021-01-17 DIAGNOSIS — Z111 Encounter for screening for respiratory tuberculosis: Secondary | ICD-10-CM | POA: Diagnosis not present

## 2021-01-17 DIAGNOSIS — Z23 Encounter for immunization: Secondary | ICD-10-CM | POA: Diagnosis not present

## 2021-02-17 DIAGNOSIS — R0981 Nasal congestion: Secondary | ICD-10-CM | POA: Diagnosis not present

## 2021-02-17 DIAGNOSIS — J029 Acute pharyngitis, unspecified: Secondary | ICD-10-CM | POA: Diagnosis not present

## 2021-03-02 DIAGNOSIS — Z0000 Encounter for general adult medical examination without abnormal findings: Secondary | ICD-10-CM | POA: Diagnosis not present

## 2021-03-02 DIAGNOSIS — N183 Chronic kidney disease, stage 3 unspecified: Secondary | ICD-10-CM | POA: Diagnosis not present

## 2021-03-02 DIAGNOSIS — E1121 Type 2 diabetes mellitus with diabetic nephropathy: Secondary | ICD-10-CM | POA: Diagnosis not present

## 2021-03-02 DIAGNOSIS — E785 Hyperlipidemia, unspecified: Secondary | ICD-10-CM | POA: Diagnosis not present

## 2021-03-02 DIAGNOSIS — I129 Hypertensive chronic kidney disease with stage 1 through stage 4 chronic kidney disease, or unspecified chronic kidney disease: Secondary | ICD-10-CM | POA: Diagnosis not present

## 2021-03-02 DIAGNOSIS — E559 Vitamin D deficiency, unspecified: Secondary | ICD-10-CM | POA: Diagnosis not present

## 2021-03-02 DIAGNOSIS — Z7984 Long term (current) use of oral hypoglycemic drugs: Secondary | ICD-10-CM | POA: Diagnosis not present

## 2021-04-06 DIAGNOSIS — M25531 Pain in right wrist: Secondary | ICD-10-CM | POA: Diagnosis not present

## 2021-04-06 DIAGNOSIS — M25511 Pain in right shoulder: Secondary | ICD-10-CM | POA: Diagnosis not present

## 2021-05-15 DIAGNOSIS — L308 Other specified dermatitis: Secondary | ICD-10-CM | POA: Diagnosis not present

## 2021-05-15 DIAGNOSIS — L218 Other seborrheic dermatitis: Secondary | ICD-10-CM | POA: Diagnosis not present

## 2021-05-21 ENCOUNTER — Emergency Department (HOSPITAL_COMMUNITY): Payer: Medicare Other

## 2021-05-21 ENCOUNTER — Other Ambulatory Visit: Payer: Self-pay

## 2021-05-21 ENCOUNTER — Inpatient Hospital Stay (HOSPITAL_COMMUNITY): Payer: Medicare Other

## 2021-05-21 ENCOUNTER — Observation Stay (HOSPITAL_COMMUNITY)
Admission: EM | Admit: 2021-05-21 | Discharge: 2021-05-22 | Disposition: A | Discharge status: Home / Self Care | Payer: Medicare Other | Attending: Cardiology | Admitting: Cardiology

## 2021-05-21 ENCOUNTER — Encounter (HOSPITAL_COMMUNITY)
Admission: EM | Disposition: A | Discharge status: Home / Self Care | Payer: Self-pay | Source: Home / Self Care | Attending: Emergency Medicine

## 2021-05-21 DIAGNOSIS — E119 Type 2 diabetes mellitus without complications: Secondary | ICD-10-CM | POA: Insufficient documentation

## 2021-05-21 DIAGNOSIS — Z79899 Other long term (current) drug therapy: Secondary | ICD-10-CM | POA: Insufficient documentation

## 2021-05-21 DIAGNOSIS — I10 Essential (primary) hypertension: Secondary | ICD-10-CM | POA: Insufficient documentation

## 2021-05-21 DIAGNOSIS — I25110 Atherosclerotic heart disease of native coronary artery with unstable angina pectoris: Secondary | ICD-10-CM | POA: Diagnosis not present

## 2021-05-21 DIAGNOSIS — Z87891 Personal history of nicotine dependence: Secondary | ICD-10-CM | POA: Insufficient documentation

## 2021-05-21 DIAGNOSIS — I214 Non-ST elevation (NSTEMI) myocardial infarction: Principal | ICD-10-CM | POA: Diagnosis present

## 2021-05-21 DIAGNOSIS — I5181 Takotsubo syndrome: Secondary | ICD-10-CM

## 2021-05-21 DIAGNOSIS — Z7984 Long term (current) use of oral hypoglycemic drugs: Secondary | ICD-10-CM | POA: Insufficient documentation

## 2021-05-21 DIAGNOSIS — Z20822 Contact with and (suspected) exposure to covid-19: Secondary | ICD-10-CM | POA: Diagnosis not present

## 2021-05-21 DIAGNOSIS — R079 Chest pain, unspecified: Secondary | ICD-10-CM | POA: Diagnosis not present

## 2021-05-21 HISTORY — PX: LEFT HEART CATH AND CORONARY ANGIOGRAPHY: CATH118249

## 2021-05-21 LAB — BASIC METABOLIC PANEL
Anion gap: 13 (ref 5–15)
BUN: 21 mg/dL (ref 8–23)
CO2: 28 mmol/L (ref 22–32)
Calcium: 9.6 mg/dL (ref 8.9–10.3)
Chloride: 96 mmol/L — ABNORMAL LOW (ref 98–111)
Creatinine, Ser: 0.94 mg/dL (ref 0.44–1.00)
GFR, Estimated: >60 mL/min (ref >60)
Glucose, Bld: 136 mg/dL — ABNORMAL HIGH (ref 70–99)
Potassium: 3.3 mmol/L — ABNORMAL LOW (ref 3.5–5.1)
Sodium: 137 mmol/L (ref 135–145)

## 2021-05-21 LAB — CBC
HCT: 44.9 % (ref 36.0–46.0)
Hemoglobin: 14.6 g/dL (ref 12.0–15.0)
MCH: 28.2 pg (ref 26.0–34.0)
MCHC: 32.5 g/dL (ref 30.0–36.0)
MCV: 86.7 fL (ref 80.0–100.0)
Platelets: 246 10*3/uL (ref 150–400)
RBC: 5.18 MIL/uL — ABNORMAL HIGH (ref 3.87–5.11)
RDW: 12.9 % (ref 11.5–15.5)
WBC: 7 10*3/uL (ref 4.0–10.5)
nRBC: 0 % (ref 0.0–0.2)

## 2021-05-21 LAB — RESP PANEL BY RT-PCR (FLU A&B, COVID) ARPGX2
Influenza A by PCR: NEGATIVE
Influenza B by PCR: NEGATIVE
SARS Coronavirus 2 by RT PCR: NEGATIVE

## 2021-05-21 LAB — MAGNESIUM: Magnesium: 1.9 mg/dL (ref 1.7–2.4)

## 2021-05-21 LAB — GLUCOSE, CAPILLARY: Glucose-Capillary: 87 mg/dL (ref 70–99)

## 2021-05-21 LAB — PROTIME-INR
INR: 0.9 (ref 0.8–1.2)
Prothrombin Time: 12.6 seconds (ref 11.4–15.2)

## 2021-05-21 LAB — TROPONIN I (HIGH SENSITIVITY)
Troponin I (High Sensitivity): 1053 ng/L (ref <18)
Troponin I (High Sensitivity): 1530 ng/L (ref <18)

## 2021-05-21 SURGERY — LEFT HEART CATH AND CORONARY ANGIOGRAPHY
Anesthesia: LOCAL

## 2021-05-21 MED ORDER — HEPARIN BOLUS VIA INFUSION
3700.0000 [IU] | Freq: Once | INTRAVENOUS | Status: AC
  Administered 2021-05-21: 3700 [IU] via INTRAVENOUS
  Filled 2021-05-21: qty 3700

## 2021-05-21 MED ORDER — METOPROLOL SUCCINATE ER 50 MG PO TB24
50.0000 mg | ORAL_TABLET | Freq: Every day | ORAL | Status: DC
  Administered 2021-05-22: 08:00:00 50 mg via ORAL
  Filled 2021-05-21: qty 1

## 2021-05-21 MED ORDER — LIDOCAINE HCL (PF) 1 % IJ SOLN
INTRAMUSCULAR | Status: DC | PRN
  Administered 2021-05-21: 2 mL via INTRADERMAL

## 2021-05-21 MED ORDER — SODIUM CHLORIDE 0.9% FLUSH
3.0000 mL | Freq: Two times a day (BID) | INTRAVENOUS | Status: DC
  Administered 2021-05-21: 3 mL via INTRAVENOUS

## 2021-05-21 MED ORDER — NITROGLYCERIN 2 % TD OINT
1.0000 [in_us] | TOPICAL_OINTMENT | Freq: Once | TRANSDERMAL | Status: AC
  Administered 2021-05-21: 1 [in_us] via TOPICAL
  Filled 2021-05-21: qty 1

## 2021-05-21 MED ORDER — HYDRALAZINE HCL 20 MG/ML IJ SOLN: 10.0000 mg | INTRAMUSCULAR | Status: AC | PRN

## 2021-05-21 MED ORDER — ONDANSETRON HCL 4 MG/2ML IJ SOLN: 4.0000 mg | Freq: Four times a day (QID) | INTRAMUSCULAR | Status: DC | PRN

## 2021-05-21 MED ORDER — ACETAMINOPHEN 325 MG PO TABS
650.0000 mg | ORAL_TABLET | ORAL | Status: DC | PRN
  Administered 2021-05-21: 650 mg via ORAL
  Filled 2021-05-21: qty 2

## 2021-05-21 MED ORDER — HEPARIN SODIUM (PORCINE) 1000 UNIT/ML IJ SOLN
INTRAMUSCULAR | Status: DC | PRN
  Administered 2021-05-21: 5000 [IU] via INTRAVENOUS

## 2021-05-21 MED ORDER — OMEGA-3-ACID ETHYL ESTERS 1 G PO CAPS
1.0000 g | ORAL_CAPSULE | Freq: Two times a day (BID) | ORAL | Status: DC
  Administered 2021-05-21 – 2021-05-22 (×2): 1 g via ORAL
  Filled 2021-05-21 (×2): qty 1

## 2021-05-21 MED ORDER — HYDROCHLOROTHIAZIDE 25 MG PO TABS
25.0000 mg | ORAL_TABLET | Freq: Every day | ORAL | Status: DC
  Administered 2021-05-22: 08:00:00 25 mg via ORAL
  Filled 2021-05-21: qty 1

## 2021-05-21 MED ORDER — POTASSIUM CHLORIDE CRYS ER 20 MEQ PO TBCR
40.0000 meq | EXTENDED_RELEASE_TABLET | Freq: Once | ORAL | Status: DC
  Filled 2021-05-21: qty 2

## 2021-05-21 MED ORDER — SODIUM CHLORIDE 0.9% FLUSH: 3.0000 mL | INTRAVENOUS | Status: DC | PRN

## 2021-05-21 MED ORDER — HYDROXYCHLOROQUINE SULFATE 200 MG PO TABS
200.0000 mg | ORAL_TABLET | Freq: Two times a day (BID) | ORAL | Status: DC
  Administered 2021-05-21 – 2021-05-22 (×2): 200 mg via ORAL
  Filled 2021-05-21 (×2): qty 1

## 2021-05-21 MED ORDER — HEPARIN (PORCINE) IN NACL 1000-0.9 UT/500ML-% IV SOLN
INTRAVENOUS | Status: AC
  Filled 2021-05-21: qty 500

## 2021-05-21 MED ORDER — SODIUM CHLORIDE 0.9 % WEIGHT BASED INFUSION: 1.0000 mL/kg/h | INTRAVENOUS | Status: DC

## 2021-05-21 MED ORDER — LOSARTAN POTASSIUM 50 MG PO TABS
50.0000 mg | ORAL_TABLET | Freq: Every day | ORAL | Status: DC
  Administered 2021-05-22: 08:00:00 50 mg via ORAL
  Filled 2021-05-21: qty 1

## 2021-05-21 MED ORDER — ASPIRIN 81 MG PO CHEW: 81.0000 mg | CHEWABLE_TABLET | ORAL | Status: DC

## 2021-05-21 MED ORDER — FENTANYL CITRATE (PF) 100 MCG/2ML IJ SOLN
INTRAMUSCULAR | Status: AC
  Filled 2021-05-21: qty 2

## 2021-05-21 MED ORDER — FENTANYL CITRATE (PF) 100 MCG/2ML IJ SOLN
INTRAMUSCULAR | Status: DC | PRN
  Administered 2021-05-21: 25 ug via INTRAVENOUS

## 2021-05-21 MED ORDER — SIMVASTATIN 20 MG PO TABS: 40.0000 mg | ORAL_TABLET | Freq: Every day | ORAL | Status: DC

## 2021-05-21 MED ORDER — LISINOPRIL-HYDROCHLOROTHIAZIDE 20-12.5 MG PO TABS: 1.0000 | ORAL_TABLET | Freq: Every morning | ORAL | Status: DC

## 2021-05-21 MED ORDER — NITROGLYCERIN 0.4 MG SL SUBL: 0.4000 mg | SUBLINGUAL_TABLET | SUBLINGUAL | Status: DC | PRN

## 2021-05-21 MED ORDER — HEPARIN (PORCINE) 25000 UT/250ML-% IV SOLN
750.0000 [IU]/h | INTRAVENOUS | Status: DC
  Administered 2021-05-21: 750 [IU]/h via INTRAVENOUS
  Filled 2021-05-21: qty 250

## 2021-05-21 MED ORDER — SODIUM CHLORIDE 0.9% FLUSH: 3.0000 mL | Freq: Two times a day (BID) | INTRAVENOUS | Status: DC

## 2021-05-21 MED ORDER — SODIUM CHLORIDE 0.9 % IV SOLN: 250.0000 mL | INTRAVENOUS | Status: DC | PRN

## 2021-05-21 MED ORDER — ASPIRIN EC 81 MG PO TBEC
81.0000 mg | DELAYED_RELEASE_TABLET | Freq: Every day | ORAL | Status: DC
  Administered 2021-05-22: 08:00:00 81 mg via ORAL
  Filled 2021-05-21: qty 1

## 2021-05-21 MED ORDER — MIDAZOLAM HCL 2 MG/2ML IJ SOLN
INTRAMUSCULAR | Status: AC
  Filled 2021-05-21: qty 2

## 2021-05-21 MED ORDER — HEPARIN SODIUM (PORCINE) 5000 UNIT/ML IJ SOLN
5000.0000 [IU] | Freq: Three times a day (TID) | INTRAMUSCULAR | Status: DC
  Administered 2021-05-21 – 2021-05-22 (×2): 5000 [IU] via SUBCUTANEOUS
  Filled 2021-05-21: qty 1

## 2021-05-21 MED ORDER — IOHEXOL 350 MG/ML SOLN
INTRAVENOUS | Status: DC | PRN
  Administered 2021-05-21: 75 mL via INTRA_ARTERIAL

## 2021-05-21 MED ORDER — HEPARIN (PORCINE) IN NACL 1000-0.9 UT/500ML-% IV SOLN
INTRAVENOUS | Status: DC | PRN
  Administered 2021-05-21 (×2): 500 mL

## 2021-05-21 MED ORDER — SODIUM CHLORIDE 0.9 % IV SOLN: INTRAVENOUS | Status: AC

## 2021-05-21 MED ORDER — VERAPAMIL HCL 2.5 MG/ML IV SOLN
INTRAVENOUS | Status: AC
  Filled 2021-05-21: qty 2

## 2021-05-21 MED ORDER — EZETIMIBE 10 MG PO TABS
10.0000 mg | ORAL_TABLET | Freq: Every morning | ORAL | Status: DC
  Administered 2021-05-22: 08:00:00 10 mg via ORAL
  Filled 2021-05-21: qty 1

## 2021-05-21 MED ORDER — LIDOCAINE HCL (PF) 1 % IJ SOLN
INTRAMUSCULAR | Status: AC
  Filled 2021-05-21: qty 30

## 2021-05-21 MED ORDER — TRAMADOL HCL 50 MG PO TABS: 50.0000 mg | ORAL_TABLET | Freq: Four times a day (QID) | ORAL | Status: DC | PRN

## 2021-05-21 MED ORDER — VERAPAMIL HCL 2.5 MG/ML IV SOLN
INTRAVENOUS | Status: DC | PRN
  Administered 2021-05-21: 10 mL via INTRA_ARTERIAL

## 2021-05-21 MED ORDER — MIDAZOLAM HCL 2 MG/2ML IJ SOLN
INTRAMUSCULAR | Status: DC | PRN
  Administered 2021-05-21: 1 mg via INTRAVENOUS

## 2021-05-21 MED ORDER — LORAZEPAM 0.5 MG PO TABS: 0.5000 mg | ORAL_TABLET | Freq: Three times a day (TID) | ORAL | Status: DC | PRN

## 2021-05-21 MED ORDER — SODIUM CHLORIDE 0.9 % WEIGHT BASED INFUSION: 3.0000 mL/kg/h | INTRAVENOUS | Status: DC

## 2021-05-21 SURGICAL SUPPLY — 11 items
CATH INFINITI 5 FR JL3.5 (CATHETERS) ×2 IMPLANT
CATH INFINITI 5FR ANG PIGTAIL (CATHETERS) ×2 IMPLANT
CATH OPTITORQUE TIG 4.0 5F (CATHETERS) ×2 IMPLANT
DEVICE RAD COMP TR BAND LRG (VASCULAR PRODUCTS) ×2 IMPLANT
GLIDESHEATH SLEND A-KIT 6F 22G (SHEATH) ×2 IMPLANT
GUIDEWIRE INQWIRE 1.5J.035X260 (WIRE) IMPLANT
INQWIRE 1.5J .035X260CM (WIRE) ×3
KIT HEART LEFT (KITS) ×3 IMPLANT
PACK CARDIAC CATHETERIZATION (CUSTOM PROCEDURE TRAY) ×3 IMPLANT
TRANSDUCER W/STOPCOCK (MISCELLANEOUS) ×3 IMPLANT
TUBING CIL FLEX 10 FLL-RA (TUBING) ×3 IMPLANT

## 2021-05-21 NOTE — ED Provider Notes (Signed)
[facility] EMERGENCY DEPARTMENT Provider Note   CSN: [id number] Arrival date & time: 05/21/21  5009     History  Chief Complaint  Patient presents with   Chest Pain    [name] is a 76 y.o. female.   Chest Pain Associated symptoms: weakness (Generalized)   Associated symptoms: no cough, no diaphoresis, no dizziness, no fever, no headache, no nausea, no numbness, no shortness of breath and no vomiting   Patient presenting for chest pain.  Location is substernal without radiation.  Onset was yesterday.  It has been intermittent.  She reports that she notices it only when she is up and moving around.  It resolves when she lays down and rests.  Yesterday, pain subsequently subsided but recurred again this morning.  She endorses generalized weakness but denies any other associated symptoms.  Per chart review, she has a history of HTN, HLD, T2DM, CVA.  She has a strong family history of CAD with multiple first-degree family members who had early heart attacks.  She has no personal cardiac history.  She is not followed by cardiology.  She does take a daily 325 mg aspirin and has taken 1 today prior to arrival.     Home Medications Prior to Admission medications   Medication Sig Start Date End Date Taking? Authorizing Provider  acetaminophen (TYLENOL) 500 MG tablet Take 1,000 mg by mouth every 6 (six) hours as needed for moderate pain, mild pain or headache.   Yes [provider]  aspirin 325 MG tablet Take 325 mg by mouth daily.   Yes [provider]  Cholecalciferol (VITAMIN D3) 1.25 MG (50000 UT) CAPS Take 1 capsule by mouth daily.   Yes [provider]  Ciclopirox 1 % shampoo Apply 1 application topically 2 (two) times a week. 05/31/19  Yes [provider]  diltiazem (DILACOR XR) 240 MG 24 hr capsule Take 240 mg by mouth every morning.   Yes [provider]  ezetimibe (ZETIA) 10 MG tablet Take 10 mg by mouth every  morning.   Yes [provider]  hydroxychloroquine (PLAQUENIL) 200 MG tablet Take by mouth 2 (two) times daily.   Yes [provider]  lisinopril-hydrochlorothiazide (ZESTORETIC) 20-25 MG tablet Take 1 tablet by mouth daily. 03/02/21  Yes [provider]  LORazepam (ATIVAN) 0.5 MG tablet Take 0.5 mg by mouth every 8 (eight) hours as needed for anxiety.   Yes [provider]  metFORMIN (GLUCOPHAGE-XR) 500 MG 24 hr tablet Take 500 mg by mouth in the morning and at bedtime. 01/25/15  Yes [provider]  metoprolol succinate (TOPROL-XL) 25 MG 24 hr tablet Take 25 mg by mouth daily. 07/01/19  Yes [provider]  Omega-3 Fatty Acids (FISH OIL) 1000 MG CAPS Take by mouth.   Yes [provider]  simvastatin (ZOCOR) 20 MG tablet Take 1 tablet (20 mg total) by mouth daily at 6 PM. Patient taking differently: Take 20 mg by mouth daily. 03/03/14  Yes Ghimire, Henreitta Leber, MD  clobetasol (TEMOVATE) 0.05 % external solution SMARTSIG:1 Application Topical Once a Week PRN Patient not taking: Reported on 05/21/2021 05/11/19   [provider]  fluocinonide (LIDEX) 0.05 % external solution APPLY TO AFFECTED AREA(S) ON SCALP UP TO TWICE DAILY AS NEEDED (NOT TO FACE, GROIN, UNDERARMS) Patient not taking: Reported on 05/21/2021 06/21/19   [provider]  lisinopril-hydrochlorothiazide (PRINZIDE,ZESTORETIC) 20-12.5 MG per tablet Take 1 tablet by mouth every morning. Patient not taking: Reported  on 05/21/2021    [provider]  NEOMYCIN-POLYMYXIN-HYDROCORTISONE (CORTISPORIN) 1 % SOLN OTIC solution Place 3 drops into the right ear 4 (four) times daily. Patient not taking: Reported on 05/21/2021 10/09/20   Cuthriell, Charline Bills, PA-C  traMADol (ULTRAM) 50 MG tablet Take 1 tablet (50 mg total) by mouth every 6 (six) hours as needed for moderate pain. Patient not taking: Reported on 05/21/2021 08/22/19   Daleen Bo, MD      Allergies     Vicodin [hydrocodone-acetaminophen], Plavix [clopidogrel], Atorvastatin, Crestor [rosuvastatin calcium], and Lipitor [atorvastatin calcium]    Review of Systems   Review of Systems  Constitutional:  Negative for chills, diaphoresis and fever.  Respiratory:  Negative for cough, choking, shortness of breath and wheezing.   Cardiovascular:  Positive for chest pain.  Gastrointestinal:  Negative for nausea and vomiting.  Musculoskeletal:  Negative for neck pain.  Neurological:  Positive for weakness (Generalized). Negative for dizziness, tremors, syncope, light-headedness, numbness and headaches.  All other systems reviewed and are negative.  Physical Exam Updated Vital Signs BP (!) 112/59 (BP Location: Left Arm)    Pulse 68    Temp 97.8 F (36.6 C) (Oral)    Resp 18    Ht 5\' 3"  (1.6 m)    Wt 63 kg Comment: scale b   SpO2 95%    BMI 24.61 kg/m  Physical Exam Vitals and nursing note reviewed.  Constitutional:      General: She is not in acute distress.    Appearance: She is well-developed. She is not ill-appearing, toxic-appearing or diaphoretic.  HENT:     Head: Normocephalic and atraumatic.  Eyes:     Extraocular Movements: Extraocular movements intact.     Conjunctiva/sclera: Conjunctivae normal.  Cardiovascular:     Rate and Rhythm: Normal rate and regular rhythm.     Heart sounds: No murmur heard. Pulmonary:     Effort: Pulmonary effort is normal. No respiratory distress.     Breath sounds: Normal breath sounds. No decreased breath sounds, wheezing, rhonchi or rales.  Chest:     Chest wall: No tenderness or edema.  Abdominal:     Palpations: Abdomen is soft.     Tenderness: There is no abdominal tenderness.  Musculoskeletal:        General: No swelling.     Cervical back: Normal range of motion and neck supple.     Right lower leg: No edema.     Left lower leg: No edema.  Skin:    General: Skin is warm and dry.     Capillary Refill: Capillary refill takes less than 2  seconds.     Coloration: Skin is not cyanotic or pale.  Neurological:     General: No focal deficit present.     Mental Status: She is alert and oriented to person, place, and time.     Cranial Nerves: No cranial nerve deficit.     Motor: No weakness.  Psychiatric:        Mood and Affect: Mood normal.        Behavior: Behavior normal.    ED Results / Procedures / Treatments   Labs (all labs ordered are listed, but only abnormal results are displayed) Labs Reviewed  BASIC METABOLIC PANEL - Abnormal; Notable for the following components:      Result Value   Potassium 3.3 (*)    Chloride 96 (*)    Glucose, Bld 136 (*)    All other components within normal  limits  CBC - Abnormal; Notable for the following components:   RBC 5.18 (*)    All other components within normal limits  TROPONIN I (HIGH SENSITIVITY) - Abnormal; Notable for the following components:   Troponin I (High Sensitivity) 1,053 (*)    All other components within normal limits  TROPONIN I (HIGH SENSITIVITY) - Abnormal; Notable for the following components:   Troponin I (High Sensitivity) 1,530 (*)    All other components within normal limits  RESP PANEL BY RT-PCR (FLU A&B, COVID) ARPGX2  MAGNESIUM  PROTIME-INR  GLUCOSE, CAPILLARY  CBC  CBG MONITORING, ED    EKG EKG Interpretation  Date/Time:  Monday May 21 2021 11:35:17 EST Ventricular Rate:  78 PR Interval:  164 QRS Duration: 147 QT Interval:  411 QTC Calculation: 469 R Axis:   242 Text Interpretation: Sinus or ectopic atrial rhythm Nonspecific IVCD with LAD Lateral infarct, old LBBB Confirmed by Godfrey Pick [phone]) on 05/21/2021 11:59:30 AM  Radiology DG Chest 2 View  Result Date: 05/21/2021 CLINICAL DATA:  Chest pain EXAM: CHEST - 2 VIEW COMPARISON:  February 2017 FINDINGS: The heart size and mediastinal contours are within normal limits. Both lungs are clear. No pleural effusion or pneumothorax. No acute osseous abnormality. IMPRESSION: No acute  process in the chest. Electronically Signed   By: Macy Mis M.D.   On: 05/21/2021 09:01   CARDIAC CATHETERIZATION  Result Date: 05/21/2021   Prox LAD to Mid LAD lesion is 50% stenosed.   Mid Cx lesion is 30% stenosed.   Prox RCA to Mid RCA lesion is 40% stenosed.   1st Mrg lesion is 30% stenosed.   There is mild left ventricular systolic dysfunction.   LV end diastolic pressure is mildly elevated.   The left ventricular ejection fraction is 45-50% by visual estimate.   There is no mitral valve regurgitation. Left Heart Catheterization 05/21/21: LV: 137/5, EDP 15 mmHg.  Initially EDP was 22 mmHg.  Ao: 140/60, mean 90 mmHg.  There was no pressure gradient across aortic valve. LVEF 50%, apical akinesis. LM: Large vessel, mildly calcified. CX: Large vessel.  Gives origin to a small to moderate-sized OM1 which has ostial 30% stenosis.  Circumflex at the origin of the OM1 has a focal 30 to 40% stenosis. LAD: Mid LAD has moderate luminal irregularity constituting a 50% mildly calcific stenosis.  Gives origin to a very large D1 which traverses all the way to the apex.  LAD ends at the apex. RCA: Dominant.  Mid right has a 30 to 40% stenosis. 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  We will obtain echocardiogram to confirm this.  Medical management for CAD as there is no culprit vessel.    Procedures Procedures    Medications Ordered in ED Medications  sodium chloride flush (NS) 0.9 % injection 3 mL (3 mLs Intravenous Given 05/21/21 2044)  sodium chloride flush (NS) 0.9 % injection 3 mL (has no administration in time range)  0.9 %  sodium chloride infusion (has no administration in time range)  hydroxychloroquine (PLAQUENIL) tablet 200 mg (200 mg Oral Given 05/21/21 2040)  ezetimibe (ZETIA) tablet 10 mg (has no administration in time range)  metoprolol succinate (TOPROL-XL) 24 hr tablet 50 mg (50 mg  Oral Not Given 05/21/21 1857)  omega-3 acid ethyl esters (LOVAZA) capsule 1 g (1 g Oral Given 05/21/21 2040)  aspirin EC tablet 81 mg (has no administration in time range)  nitroGLYCERIN (NITROSTAT) SL tablet 0.4 mg (has no administration in time range)  ondansetron (ZOFRAN) injection 4 mg (has no administration in time range)  potassium chloride SA (KLOR-CON M) CR tablet 40 mEq ( Oral MAR Unhold 05/21/21 1712)  losartan (COZAAR) tablet 50 mg (50 mg Oral Not Given 05/21/21 1858)  hydrALAZINE (APRESOLINE) injection 10 mg (has no administration in time range)  acetaminophen (TYLENOL) tablet 650 mg (650 mg Oral Given 05/21/21 1904)  heparin injection 5,000 Units (5,000 Units Subcutaneous Given 05/22/21 0551)  0.9 %  sodium chloride infusion ( Intravenous Rate/Dose Change 05/21/21 1443)  sodium chloride flush (NS) 0.9 % injection 3 mL (3 mLs Intravenous Not Given 05/21/21 2143)  sodium chloride flush (NS) 0.9 % injection 3 mL (has no administration in time range)  0.9 %  sodium chloride infusion (has no administration in time range)  simvastatin (ZOCOR) tablet 40 mg (has no administration in time range)  hydrochlorothiazide (HYDRODIURIL) tablet 25 mg (has no administration in time range)  nitroGLYCERIN (NITROGLYN) 2 % ointment 1 inch (1 inch Topical Given 05/21/21 1209)  heparin bolus via infusion 3,700 Units (3,700 Units Intravenous Bolus from Bag 05/21/21 1212)    ED Course/ Medical Decision Making/ A&P                           Medical Decision Making Amount and/or Complexity of Data Reviewed Labs: ordered. Radiology: ordered.  Risk Prescription drug management. Decision regarding hospitalization.   This patient presents to the ED for concern of chest pain, this involves an extensive number of treatment options, and is a complaint that carries with it a high risk of complications and morbidity.  The differential diagnosis includes ACS, pericarditis, myocarditis, PE, aortic dissection, GERD,  PUD   Co morbidities that complicate the patient evaluation  HTN, HLD, T2DM   Additional history obtained:  Additional history obtained from patient's husband External records from outside source obtained and reviewed including EMR   Lab Tests:  I Ordered, and personally interpreted labs.  The pertinent results include: Elevation in troponin   Imaging Studies ordered:  I ordered imaging studies including chest x-ray I independently visualized and interpreted imaging which showed no acute findings I agree with the radiologist interpretation   Cardiac Monitoring:  The patient was maintained on a cardiac monitor.  I personally viewed and interpreted the cardiac monitored which showed an underlying rhythm of: Sinus rhythm   Medicines ordered and prescription drug management:  I ordered medication including ASA, NTG, and heparin for NSTEMI Reevaluation of the patient after these medicines showed that the patient improved I have reviewed the patients home medicines and have made adjustments as needed  Critical Interventions:  ASA, NTG, and heparin for treatment of NSTEMI.  Consult with cardiology.   Consultations Obtained:  I requested consultation with the cardiologist,  and discussed lab and imaging findings as well as pertinent plan - they recommend: Admission for cath.   Problem List / ED Course:  76 year old female presenting for chest pain.  Prior to being bedded in the ED, diagnostic work-up was initiated.  On patient's EKG, [place] branch block is present, which was also present on prior EKG from 2020.  On patient's lab work, she had initial troponin that was elevated at 1053.  Upon being bedded in the ED, patient reports very mild chest pain that she only notices when she is up and moving around.  She did take 325 ASA earlier today.  Heparin and nitroglycerin ointment were ordered.  Repeat EKG was obtained.  On repeat EKG, there is what appears to be initiation of  Q waves in aVL.  I discussed this with STEMI doctor on-call, Dr. Einar Gip.  He was secondary to lead placement and agrees that EKG is not consistent with STEMI.  Patient will need left heart cath and admission for NSTEMI.  Dr. Einar Gip states that he will be happy to come see her in the ED and admit her.   Reevaluation:  After the interventions noted above, I reevaluated the patient and found that they have :improved   Social Determinants of Health:  Patient has primary care doctor and access to outpatient care.   Dispostion:  After consideration of the diagnostic results and the patients response to treatment, I feel that the patent would benefit from admission to cardiology.   CRITICAL CARE Performed by: Godfrey Pick   Total critical care time: 32 minutes  Critical care time was exclusive of separately billable procedures and treating other patients.  Critical care was necessary to treat or prevent imminent or life-threatening deterioration.  Critical care was time spent personally by me on the following activities: development of treatment plan with patient and/or surrogate as well as nursing, discussions with consultants, evaluation of patient's response to treatment, examination of patient, obtaining history from patient or surrogate, ordering and performing treatments and interventions, ordering and review of laboratory studies, ordering and review of radiographic studies, pulse oximetry and re-evaluation of patient's condition.          Final Clinical Impression(s) / ED Diagnoses Final diagnoses:  NSTEMI (non-ST elevated myocardial infarction) [facility])    Rx / DC Orders ED Discharge Orders     None         Godfrey Pick, MD 05/22/21 [id number]
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
## 2021-05-21 NOTE — Progress Notes (Signed)
Husband took all belongings home prior to cardiac cath. Pt has cell phone and panties and bra at the bedside that husband brought back to the hospital room 325.

## 2021-05-21 NOTE — Plan of Care (Signed)

## 2021-05-21 NOTE — Progress Notes (Signed)
Held pressure to r wrist; Dr Einar Gip at bedside- requested radial band to be repositioned. Radial band repositioned  with the assistance of Tally Joe, Therapist, sports. Band repositioned. 15 cc in band, radial waveform positive, Barbeu is positive, hand discolored 3 sec cap refill less than 3 seconds. Pt. Denies pain, por numbness. Echo at bedside to complete echo study.

## 2021-05-21 NOTE — Progress Notes (Signed)
°  Echocardiogram 2D Echocardiogram has been performed.  Darlina Sicilian M 05/21/2021, 3:20 PM
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
## 2021-05-21 NOTE — ED Triage Notes (Signed)
Pt developed central chest pain without radiation while riding in the car yesterday around lunch time. Pain eventually subsided and she slept well last night but pain recurred this am and has some associated generalized weakness.

## 2021-05-21 NOTE — ED Notes (Signed)
Patient transported to X-ray 

## 2021-05-21 NOTE — Progress Notes (Signed)
R radial area with ecchymosis ;hematoma resolved. Pt. Neurovascular status intact to r hand.

## 2021-05-21 NOTE — Interval H&P Note (Signed)
History and Physical Interval Note:  05/21/2021 1:13 PM  [name]  has presented today for surgery, with the diagnosis of nstemi - urgent.  The various methods of treatment have been discussed with the patient and family. After consideration of risks, benefits and other options for treatment, the patient has consented to  Procedure(s): LEFT HEART CATH AND CORONARY ANGIOGRAPHY (N/A) and possible coronary angioplasty as a surgical intervention.  The patient's history has been reviewed, patient examined, no change in status, stable for surgery.  I have reviewed the patient's chart and labs.  Questions were answered to the patient's satisfaction.     Adrian Prows

## 2021-05-22 ENCOUNTER — Encounter (HOSPITAL_COMMUNITY): Payer: Self-pay | Admitting: Cardiology

## 2021-05-22 DIAGNOSIS — I25110 Atherosclerotic heart disease of native coronary artery with unstable angina pectoris: Secondary | ICD-10-CM | POA: Diagnosis not present

## 2021-05-22 DIAGNOSIS — Z20822 Contact with and (suspected) exposure to covid-19: Secondary | ICD-10-CM | POA: Diagnosis not present

## 2021-05-22 DIAGNOSIS — I214 Non-ST elevation (NSTEMI) myocardial infarction: Secondary | ICD-10-CM | POA: Diagnosis not present

## 2021-05-22 DIAGNOSIS — I10 Essential (primary) hypertension: Secondary | ICD-10-CM | POA: Diagnosis not present

## 2021-05-22 LAB — ECHOCARDIOGRAM COMPLETE
Area-P 1/2: 2.62 cm2
Calc EF: 41.4 %
Height: 63 in
S' Lateral: 2.6 cm
Single Plane A2C EF: 50.5 %
Single Plane A4C EF: 33.2 %
Weight: 2224 oz

## 2021-05-22 LAB — CBC
HCT: 39 % (ref 36.0–46.0)
Hemoglobin: 12.8 g/dL (ref 12.0–15.0)
MCH: 28.1 pg (ref 26.0–34.0)
MCHC: 32.8 g/dL (ref 30.0–36.0)
MCV: 85.7 fL (ref 80.0–100.0)
Platelets: 209 10*3/uL (ref 150–400)
RBC: 4.55 MIL/uL (ref 3.87–5.11)
RDW: 13 % (ref 11.5–15.5)
WBC: 5.9 10*3/uL (ref 4.0–10.5)
nRBC: 0 % (ref 0.0–0.2)

## 2021-05-22 MED ORDER — METOPROLOL SUCCINATE ER 100 MG PO TB24: 100.0000 mg | ORAL_TABLET | Freq: Every day | ORAL | 2 refills | Status: DC

## 2021-05-22 MED ORDER — ASPIRIN 81 MG PO CHEW: 81.0000 mg | CHEWABLE_TABLET | Freq: Every day | ORAL | Status: AC

## 2021-05-22 MED ORDER — LOSARTAN POTASSIUM 50 MG PO TABS: 50.0000 mg | ORAL_TABLET | Freq: Every day | ORAL | 2 refills | Status: DC

## 2021-05-22 MED ORDER — SIMVASTATIN 40 MG PO TABS: 40.0000 mg | ORAL_TABLET | Freq: Every day | ORAL | 1 refills | Status: DC

## 2021-05-22 NOTE — Discharge Summary (Addendum)
Physician Discharge Summary  Patient ID: RETTA PITCHER MRN: [id number] DOB/AGE: 1945-07-05 76 y.o.  Admit date: 05/21/2021 Discharge date: 05/22/2021  Admission Diagnoses:  Discharge Diagnoses:  Principal Problem:   NSTEMI (non-ST elevated myocardial infarction) [facility]) Active Problems:   Coronary artery disease involving native coronary artery of native heart with unstable angina pectoris ([place])   Takotsubo cardiomyopathy   Discharged Condition: good  Hospital Course: Ms. Michela Herst is a 76 year old Caucasian female patient with hypertension, hyperlipidemia, diet-controlled diabetes mellitus, admitted to the hospital with chest pain, she has underlying left bundle branch block.  Upon presentation, she had positive cardiac markers for non-STEMI.  In view of ongoing chest pain, she underwent fairly urgent cardiac catheterization essentially revealing moderate coronary disease and moderate to severe LV systolic dysfunction with anterior and apical akinesis to severe hypokinesis suggestive of Takotsubo syndrome.  He was medically managed, the following morning she was stable without chest pain, no clinical evidence of heart failure, hence felt stable for discharge.  Consults:  Outpatient cardiac rehab referral has been sent.  Significant Diagnostic Studies: labs: Positive S. Troponin, radiology: CXR: normal, angiography:  Left Heart Catheterization 05/21/21:  LV: 137/5, EDP 15 mmHg.  Initially EDP was 22 mmHg.  Ao: 140/60, mean 90 mmHg.  There was no pressure gradient across aortic valve. LVEF 50%, apical akinesis. LM: Large vessel, mildly calcified. CX: Large vessel.  Gives origin to a small to moderate-sized OM1 which has ostial 30% stenosis.  Circumflex at the origin of the OM1 has a focal 30 to 40% stenosis. LAD: Mid LAD has moderate luminal irregularity constituting a 50% mildly calcific stenosis.  Gives origin to a very large D1 which traverses all the way to the apex.  LAD ends at  the apex. RCA: Dominant.  Mid right has a 30 to 40% stenosis.  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  We will obtain echocardiogram to confirm this.  Medical management for CAD as there is no culprit vessel.  Echocardiogram 05/21/2021:  1. Left ventricular ejection fraction, by estimation, is 35 to 40%. The left ventricle has moderately decreased function. The left ventricle demonstrates regional wall motion abnormalities (see scoring diagram/findings for description). Left ventricular  diastolic parameters are consistent with Grade II diastolic dysfunction (pseudonormalization). Elevated left ventricular end-diastolic pressure. There is severe hypokinesis of the left ventricular, mid-apical septal wall, anterior wall and apical  segment. There is severe hypokinesis of the left ventricular, apical inferior segment and apical segment.  2. Right ventricular systolic function is normal. The right ventricular size is normal. There is normal pulmonary artery systolic pressure. The estimated right ventricular systolic pressure is 56.3 mmHg.  3. Left atrial size was mildly dilated.  4. The mitral valve is normal in structure. Mild mitral valve regurgitation.  5. The aortic valve is normal in structure. Aortic valve regurgitation is not visualized. No aortic stenosis is present.   Treatments:   Diltiazem CD was discontinued, metoprolol succinate was increased from 25 mg to 100 mg daily as she was on diltiazem 240 mg daily for hypertension.  Lisinopril HCT was discontinued and switched to losartan HCT 50 mg in the morning.  Patient will be discharged home today with outpatient follow-up within a week, expect complete recovery of her LV systolic function.  She will need repeat echocardiogram in 3 months.   Discharge Exam: Blood pressure (!) 112/59, pulse 78, temperature (!) 97.3 F (36.3 C),  temperature source Oral,  resp. rate 18, height 5\' 3"  (1.6 m), weight 63 kg, SpO2 96 %.  Physical Exam Constitutional:      Appearance: Normal appearance. She is normal weight.  HENT:     Mouth/Throat:     Mouth: Mucous membranes are moist.  Eyes:     Extraocular Movements: Extraocular movements intact.  Neck:     Vascular: No carotid bruit or JVD.  Cardiovascular:     Rate and Rhythm: Normal rate and regular rhythm.     Pulses: Intact distal pulses.     Heart sounds: Normal heart sounds. No murmur heard.   No gallop.  Pulmonary:     Effort: Pulmonary effort is normal.     Breath sounds: Normal breath sounds.  Abdominal:     General: Bowel sounds are normal.     Palpations: Abdomen is soft.  Musculoskeletal:        General: No swelling.     Cervical back: Normal range of motion.  Skin:    General: Skin is warm.     Capillary Refill: Capillary refill takes less than 2 seconds.     Comments: Mild bruising at the right radial arterial access site.  Neurological:     General: No focal deficit present.     Mental Status: She is oriented to person, place, and time.     Disposition: Discharge disposition: 01-Home or Self Care      Discharge Instructions     AMB referral to cardiac rehabilitation   Complete by: As directed    Diagnosis: Heart Failure (see criteria below if ordering Phase II)   Heart Failure Type: Chronic Systolic Comment - New onset cardiomyopathy      Allergies as of 05/22/2021       Reactions   Vicodin [hydrocodone-acetaminophen] Swelling   Pt tolerates plain Tylenol   Plavix [clopidogrel] Hives   Severe itching   Atorvastatin Rash   Crestor [rosuvastatin Calcium] Other (See Comments)   MYALGIA   Lipitor [atorvastatin Calcium] Other (See Comments)   MYALGIA        Medication List     STOP taking these medications    aspirin 325 MG tablet Replaced by: aspirin 81 MG chewable tablet   diltiazem 240 MG 24 hr capsule Commonly known as:  DILACOR XR   lisinopril-hydrochlorothiazide 20-12.5 MG tablet Commonly known as: ZESTORETIC   lisinopril-hydrochlorothiazide 20-25 MG tablet Commonly known as: ZESTORETIC       TAKE these medications    acetaminophen 500 MG tablet Commonly known as: TYLENOL Take 1,000 mg by mouth every 6 (six) hours as needed for moderate pain, mild pain or headache.   aspirin 81 MG chewable tablet Commonly known as: Aspirin Childrens Chew 1 tablet (81 mg total) by mouth daily. Replaces: aspirin 325 MG tablet   Ciclopirox 1 % shampoo Apply 1 application topically 2 (two) times a week.   ezetimibe 10 MG tablet Commonly known as: ZETIA Take 10 mg by mouth every morning.   Fish Oil 1000 MG Caps Take by mouth.   hydroxychloroquine 200 MG tablet Commonly known as: PLAQUENIL Take by mouth 2 (two) times daily.   LORazepam 0.5 MG tablet Commonly known as: ATIVAN Take 0.5 mg by mouth every 8 (eight) hours as needed for anxiety.   losartan 50 MG tablet Commonly known as: COZAAR Take 1 tablet (50 mg total) by mouth daily. Start taking on: May 23, 2021   metFORMIN 500 MG 24 hr tablet Commonly known as: GLUCOPHAGE-XR Take 500 mg  by mouth in the morning and at bedtime.   metoprolol succinate 100 MG 24 hr tablet Commonly known as: TOPROL-XL Take 1 tablet (100 mg total) by mouth daily. Take with or immediately following a meal. Start taking on: May 23, 2021 What changed:  medication strength how much to take additional instructions   simvastatin 40 MG tablet Commonly known as: ZOCOR Take 1 tablet (40 mg total) by mouth daily at 6 PM. What changed:  medication strength how much to take   Vitamin D3 1.25 MG (50000 UT) Caps Take 1 capsule by mouth daily.        Follow-up Information     Alethia Berthold, PA-C Follow up on 06/01/2021.   Specialty: Cardiology Why: 9:30 AM . Bring all medications. Dr. Einar Gip will also see you Contact information: [place] [address] [phone]         Harlan Stains, MD. Go on 05/25/2021.   Specialty: Family Medicine Why: @11 :15am Contact information: [place] [address] [phone]                 Adrian Prows, MD, [facility] 05/22/2021, 11:10 AM Office: [phone] Fax: [phone] Pager: [phone]

## 2021-05-22 NOTE — Care Management CC44 (Signed)
Condition Code 44 Documentation Completed  Patient Details  Name: [name] MRN: [id number] Date of Birth: [birth year]   Condition Code 44 given:  Yes Patient signature on Condition Code 44 notice:  Yes Documentation of 2 MD's agreement:  Yes Code 44 added to claim:  Yes    Zenon Mayo, RN 05/22/2021, 10:47 AM

## 2021-05-22 NOTE — Care Management Obs Status (Signed)
[facility] NOTIFICATION   Patient Details  Name: CHERINE DRUMGOOLE MRN: [id number] Date of Birth: 1946/03/03   Medicare Observation Status Notification Given:  Yes    Zenon Mayo, RN 05/22/2021, 10:47 AM

## 2021-05-22 NOTE — Progress Notes (Signed)
PIV removed. Discharge instructions completed. Patient verbalized understanding of medication regimen, follow up appointments and discharge instructions. Patient belongings gathered and packed to discharge.  

## 2021-05-23 ENCOUNTER — Telehealth: Payer: Self-pay

## 2021-05-23 NOTE — Telephone Encounter (Signed)
Location of hospitalization: [facility] Reason for hospitalization: Chest pain Date of discharge: 05/22/2021 Date of first communication with patient: today Person contacting patient: Me Current symptoms: very mild chest pain, but not like before Do you understand why you were in the Hospital: Yes Questions regarding discharge instructions: None Where were you discharged to: Home Medications reviewed: Yes Allergies reviewed: Yes Dietary changes reviewed: Yes. Discussed low fat and low salt diet.  Referals reviewed: NA Activities of Daily Living: Able to with mild limitations Any transportation issues/concerns: None Any patient concerns: None Confirmed importance & date/time of Follow up appt: Yes Confirmed with patient if condition begins to worsen call. Pt was given the office number and encouraged to call back with questions or concerns: Yes

## 2021-05-24 ENCOUNTER — Telehealth: Payer: Self-pay

## 2021-05-24 NOTE — Telephone Encounter (Signed)
Patient is aware 

## 2021-05-24 NOTE — Telephone Encounter (Signed)
Yes she can resume activity if no symptoms Start 1 mile per day until seen

## 2021-05-25 DIAGNOSIS — I25110 Atherosclerotic heart disease of native coronary artery with unstable angina pectoris: Secondary | ICD-10-CM | POA: Diagnosis not present

## 2021-05-25 DIAGNOSIS — Z09 Encounter for follow-up examination after completed treatment for conditions other than malignant neoplasm: Secondary | ICD-10-CM | POA: Diagnosis not present

## 2021-05-25 DIAGNOSIS — I5181 Takotsubo syndrome: Secondary | ICD-10-CM | POA: Diagnosis not present

## 2021-05-25 DIAGNOSIS — I447 Left bundle-branch block, unspecified: Secondary | ICD-10-CM | POA: Diagnosis not present

## 2021-05-25 DIAGNOSIS — I214 Non-ST elevation (NSTEMI) myocardial infarction: Secondary | ICD-10-CM | POA: Diagnosis not present

## 2021-05-30 ENCOUNTER — Telehealth (HOSPITAL_COMMUNITY): Payer: Self-pay | Admitting: Family Medicine
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
## 2021-06-01 ENCOUNTER — Other Ambulatory Visit: Payer: Self-pay

## 2021-06-01 ENCOUNTER — Encounter: Payer: Self-pay | Admitting: Student

## 2021-06-01 ENCOUNTER — Ambulatory Visit: Payer: Medicare Other | Admitting: Student

## 2021-06-01 VITALS — BP 153/82 | HR 77 | Temp 98.0°F | Resp 16 | Ht 63.0 in | Wt 142.8 lb

## 2021-06-01 DIAGNOSIS — I5181 Takotsubo syndrome: Secondary | ICD-10-CM | POA: Diagnosis not present

## 2021-06-01 DIAGNOSIS — I2510 Atherosclerotic heart disease of native coronary artery without angina pectoris: Secondary | ICD-10-CM | POA: Diagnosis not present

## 2021-06-01 DIAGNOSIS — I214 Non-ST elevation (NSTEMI) myocardial infarction: Secondary | ICD-10-CM | POA: Diagnosis not present

## 2021-06-01 MED ORDER — LOSARTAN POTASSIUM 100 MG PO TABS: 100.0000 mg | ORAL_TABLET | Freq: Every day | ORAL | 3 refills | Status: DC

## 2021-06-01 MED ORDER — DULOXETINE HCL 30 MG PO CPEP: 30.0000 mg | ORAL_CAPSULE | Freq: Every evening | ORAL | 3 refills | Status: DC

## 2021-06-05 ENCOUNTER — Encounter (HOSPITAL_COMMUNITY): Payer: Self-pay | Admitting: *Deleted

## 2021-06-05 ENCOUNTER — Other Ambulatory Visit: Payer: Self-pay

## 2021-06-05 ENCOUNTER — Inpatient Hospital Stay (HOSPITAL_COMMUNITY): Payer: Medicare Other

## 2021-06-05 ENCOUNTER — Encounter (HOSPITAL_COMMUNITY): Payer: Medicare Other

## 2021-06-05 ENCOUNTER — Emergency Department (HOSPITAL_COMMUNITY): Payer: Medicare Other

## 2021-06-05 ENCOUNTER — Inpatient Hospital Stay (HOSPITAL_COMMUNITY)
Admission: EM | Admit: 2021-06-05 | Discharge: 2021-06-07 | DRG: 291 | Disposition: A | Discharge status: Home / Self Care | Payer: Medicare Other | Attending: Internal Medicine | Admitting: Internal Medicine

## 2021-06-05 DIAGNOSIS — G3184 Mild cognitive impairment, so stated: Secondary | ICD-10-CM | POA: Diagnosis present

## 2021-06-05 DIAGNOSIS — Z7984 Long term (current) use of oral hypoglycemic drugs: Secondary | ICD-10-CM | POA: Diagnosis not present

## 2021-06-05 DIAGNOSIS — F419 Anxiety disorder, unspecified: Secondary | ICD-10-CM | POA: Diagnosis present

## 2021-06-05 DIAGNOSIS — Z20822 Contact with and (suspected) exposure to covid-19: Secondary | ICD-10-CM | POA: Diagnosis present

## 2021-06-05 DIAGNOSIS — I25110 Atherosclerotic heart disease of native coronary artery with unstable angina pectoris: Secondary | ICD-10-CM | POA: Diagnosis present

## 2021-06-05 DIAGNOSIS — R778 Other specified abnormalities of plasma proteins: Secondary | ICD-10-CM

## 2021-06-05 DIAGNOSIS — E1169 Type 2 diabetes mellitus with other specified complication: Secondary | ICD-10-CM | POA: Diagnosis not present

## 2021-06-05 DIAGNOSIS — E876 Hypokalemia: Secondary | ICD-10-CM | POA: Diagnosis not present

## 2021-06-05 DIAGNOSIS — Z87891 Personal history of nicotine dependence: Secondary | ICD-10-CM | POA: Diagnosis not present

## 2021-06-05 DIAGNOSIS — M1990 Unspecified osteoarthritis, unspecified site: Secondary | ICD-10-CM | POA: Diagnosis not present

## 2021-06-05 DIAGNOSIS — E559 Vitamin D deficiency, unspecified: Secondary | ICD-10-CM | POA: Diagnosis not present

## 2021-06-05 DIAGNOSIS — M069 Rheumatoid arthritis, unspecified: Secondary | ICD-10-CM | POA: Diagnosis present

## 2021-06-05 DIAGNOSIS — Z90710 Acquired absence of both cervix and uterus: Secondary | ICD-10-CM | POA: Diagnosis not present

## 2021-06-05 DIAGNOSIS — I5043 Acute on chronic combined systolic (congestive) and diastolic (congestive) heart failure: Secondary | ICD-10-CM | POA: Diagnosis not present

## 2021-06-05 DIAGNOSIS — Z7982 Long term (current) use of aspirin: Secondary | ICD-10-CM

## 2021-06-05 DIAGNOSIS — J9601 Acute respiratory failure with hypoxia: Secondary | ICD-10-CM | POA: Diagnosis not present

## 2021-06-05 DIAGNOSIS — Z79899 Other long term (current) drug therapy: Secondary | ICD-10-CM | POA: Diagnosis not present

## 2021-06-05 DIAGNOSIS — E785 Hyperlipidemia, unspecified: Secondary | ICD-10-CM | POA: Diagnosis present

## 2021-06-05 DIAGNOSIS — I2510 Atherosclerotic heart disease of native coronary artery without angina pectoris: Secondary | ICD-10-CM | POA: Diagnosis not present

## 2021-06-05 DIAGNOSIS — I213 ST elevation (STEMI) myocardial infarction of unspecified site: Secondary | ICD-10-CM | POA: Diagnosis not present

## 2021-06-05 DIAGNOSIS — J810 Acute pulmonary edema: Secondary | ICD-10-CM | POA: Diagnosis not present

## 2021-06-05 DIAGNOSIS — I10 Essential (primary) hypertension: Secondary | ICD-10-CM | POA: Diagnosis present

## 2021-06-05 DIAGNOSIS — M353 Polymyalgia rheumatica: Secondary | ICD-10-CM | POA: Diagnosis not present

## 2021-06-05 DIAGNOSIS — J9602 Acute respiratory failure with hypercapnia: Secondary | ICD-10-CM

## 2021-06-05 DIAGNOSIS — Z888 Allergy status to other drugs, medicaments and biological substances status: Secondary | ICD-10-CM

## 2021-06-05 DIAGNOSIS — Z981 Arthrodesis status: Secondary | ICD-10-CM | POA: Diagnosis not present

## 2021-06-05 DIAGNOSIS — Z885 Allergy status to narcotic agent status: Secondary | ICD-10-CM

## 2021-06-05 DIAGNOSIS — I110 Hypertensive heart disease with heart failure: Principal | ICD-10-CM | POA: Diagnosis present

## 2021-06-05 DIAGNOSIS — Z8673 Personal history of transient ischemic attack (TIA), and cerebral infarction without residual deficits: Secondary | ICD-10-CM

## 2021-06-05 DIAGNOSIS — I160 Hypertensive urgency: Secondary | ICD-10-CM

## 2021-06-05 DIAGNOSIS — Z820 Family history of epilepsy and other diseases of the nervous system: Secondary | ICD-10-CM

## 2021-06-05 DIAGNOSIS — I5181 Takotsubo syndrome: Secondary | ICD-10-CM | POA: Diagnosis not present

## 2021-06-05 DIAGNOSIS — I252 Old myocardial infarction: Secondary | ICD-10-CM | POA: Diagnosis not present

## 2021-06-05 DIAGNOSIS — E119 Type 2 diabetes mellitus without complications: Secondary | ICD-10-CM

## 2021-06-05 DIAGNOSIS — R0602 Shortness of breath: Secondary | ICD-10-CM | POA: Diagnosis not present

## 2021-06-05 DIAGNOSIS — R110 Nausea: Secondary | ICD-10-CM | POA: Diagnosis not present

## 2021-06-05 DIAGNOSIS — Z8249 Family history of ischemic heart disease and other diseases of the circulatory system: Secondary | ICD-10-CM

## 2021-06-05 DIAGNOSIS — R7989 Other specified abnormal findings of blood chemistry: Secondary | ICD-10-CM

## 2021-06-05 DIAGNOSIS — Z823 Family history of stroke: Secondary | ICD-10-CM

## 2021-06-05 LAB — CBC WITH DIFFERENTIAL/PLATELET
Abs Immature Granulocytes: 0.06 10*3/uL (ref 0.00–0.07)
Basophils Absolute: 0 10*3/uL (ref 0.0–0.1)
Basophils Relative: 0 %
Eosinophils Absolute: 0.3 10*3/uL (ref 0.0–0.5)
Eosinophils Relative: 2 %
HCT: 54 % — ABNORMAL HIGH (ref 36.0–46.0)
Hemoglobin: 17.6 g/dL — ABNORMAL HIGH (ref 12.0–15.0)
Immature Granulocytes: 0 %
Lymphocytes Relative: 39 %
Lymphs Abs: 5.3 10*3/uL — ABNORMAL HIGH (ref 0.7–4.0)
MCH: 28.8 pg (ref 26.0–34.0)
MCHC: 32.6 g/dL (ref 30.0–36.0)
MCV: 88.4 fL (ref 80.0–100.0)
Monocytes Absolute: 1.2 10*3/uL — ABNORMAL HIGH (ref 0.1–1.0)
Monocytes Relative: 9 %
Neutro Abs: 6.6 10*3/uL (ref 1.7–7.7)
Neutrophils Relative %: 50 %
Platelets: 373 10*3/uL (ref 150–400)
RBC: 6.11 MIL/uL — ABNORMAL HIGH (ref 3.87–5.11)
RDW: 13.2 % (ref 11.5–15.5)
WBC: 13.5 10*3/uL — ABNORMAL HIGH (ref 4.0–10.5)
nRBC: 0 % (ref 0.0–0.2)

## 2021-06-05 LAB — BASIC METABOLIC PANEL
Anion gap: 14 (ref 5–15)
BUN: 16 mg/dL (ref 8–23)
CO2: 22 mmol/L (ref 22–32)
Calcium: 9.4 mg/dL (ref 8.9–10.3)
Chloride: 103 mmol/L (ref 98–111)
Creatinine, Ser: 0.89 mg/dL (ref 0.44–1.00)
GFR, Estimated: >60 mL/min (ref >60)
Glucose, Bld: 217 mg/dL — ABNORMAL HIGH (ref 70–99)
Potassium: 3.7 mmol/L (ref 3.5–5.1)
Sodium: 139 mmol/L (ref 135–145)

## 2021-06-05 LAB — LIPID PANEL
Cholesterol: 140 mg/dL (ref 0–200)
HDL: 67 mg/dL (ref >40)
LDL Cholesterol: 64 mg/dL (ref 0–99)
Total CHOL/HDL Ratio: 2.1 RATIO
Triglycerides: 47 mg/dL (ref <150)
VLDL: 9 mg/dL (ref 0–40)

## 2021-06-05 LAB — I-STAT ARTERIAL BLOOD GAS, ED
Acid-base deficit: 3 mmol/L — ABNORMAL HIGH (ref 0.0–2.0)
Bicarbonate: 24.9 mmol/L (ref 20.0–28.0)
Calcium, Ion: 1.23 mmol/L (ref 1.15–1.40)
HCT: 47 % — ABNORMAL HIGH (ref 36.0–46.0)
Hemoglobin: 16 g/dL — ABNORMAL HIGH (ref 12.0–15.0)
O2 Saturation: 95 %
Patient temperature: 98.6
Potassium: 3.2 mmol/L — ABNORMAL LOW (ref 3.5–5.1)
Sodium: 137 mmol/L (ref 135–145)
TCO2: 26 mmol/L (ref 22–32)
pCO2 arterial: 54.4 mmHg — ABNORMAL HIGH (ref 32.0–48.0)
pH, Arterial: 7.268 — ABNORMAL LOW (ref 7.350–7.450)
pO2, Arterial: 87 mmHg (ref 83.0–108.0)

## 2021-06-05 LAB — TROPONIN I (HIGH SENSITIVITY)
Troponin I (High Sensitivity): 22 ng/L — ABNORMAL HIGH (ref <18)
Troponin I (High Sensitivity): 49 ng/L — ABNORMAL HIGH (ref <18)
Troponin I (High Sensitivity): 71 ng/L — ABNORMAL HIGH (ref <18)
Troponin I (High Sensitivity): 59 ng/L — ABNORMAL HIGH (ref <18)

## 2021-06-05 LAB — ECHOCARDIOGRAM LIMITED
AR max vel: 2.35 cm2
AV Peak grad: 7 mmHg
Ao pk vel: 1.32 m/s
Area-P 1/2: 3.17 cm2
Calc EF: 39.7 %
S' Lateral: 2.7 cm
Single Plane A2C EF: 40.6 %
Single Plane A4C EF: 37.1 %

## 2021-06-05 LAB — RESP PANEL BY RT-PCR (FLU A&B, COVID) ARPGX2
Influenza A by PCR: NEGATIVE
Influenza B by PCR: NEGATIVE
SARS Coronavirus 2 by RT PCR: NEGATIVE

## 2021-06-05 LAB — BRAIN NATRIURETIC PEPTIDE: B Natriuretic Peptide: 1894.1 pg/mL — ABNORMAL HIGH (ref 0.0–100.0)

## 2021-06-05 LAB — GLUCOSE, CAPILLARY
Glucose-Capillary: 82 mg/dL (ref 70–99)
Glucose-Capillary: 173 mg/dL — ABNORMAL HIGH (ref 70–99)

## 2021-06-05 LAB — HEMOGLOBIN A1C
Hgb A1c MFr Bld: 6 % — ABNORMAL HIGH (ref 4.8–5.6)
Mean Plasma Glucose: 125.5 mg/dL

## 2021-06-05 LAB — CBG MONITORING, ED: Glucose-Capillary: 109 mg/dL — ABNORMAL HIGH (ref 70–99)

## 2021-06-05 MED ORDER — BISACODYL 5 MG PO TBEC: 5.0000 mg | DELAYED_RELEASE_TABLET | Freq: Every day | ORAL | Status: DC | PRN

## 2021-06-05 MED ORDER — SIMVASTATIN 20 MG PO TABS
40.0000 mg | ORAL_TABLET | Freq: Every day | ORAL | Status: DC
  Administered 2021-06-05 – 2021-06-06 (×2): 40 mg via ORAL
  Filled 2021-06-05 (×2): qty 2

## 2021-06-05 MED ORDER — PERFLUTREN LIPID MICROSPHERE
1.0000 mL | INTRAVENOUS | Status: AC | PRN
  Administered 2021-06-05: 2 mL via INTRAVENOUS
  Filled 2021-06-05: qty 10

## 2021-06-05 MED ORDER — METOPROLOL SUCCINATE ER 100 MG PO TB24
100.0000 mg | ORAL_TABLET | Freq: Every day | ORAL | Status: DC
  Administered 2021-06-06 – 2021-06-07 (×2): 100 mg via ORAL
  Filled 2021-06-05 (×2): qty 1

## 2021-06-05 MED ORDER — POTASSIUM CHLORIDE CRYS ER 10 MEQ PO TBCR
20.0000 meq | EXTENDED_RELEASE_TABLET | Freq: Two times a day (BID) | ORAL | Status: DC
  Administered 2021-06-05 – 2021-06-07 (×5): 20 meq via ORAL
  Filled 2021-06-05 (×10): qty 2

## 2021-06-05 MED ORDER — MORPHINE SULFATE (PF) 4 MG/ML IV SOLN
INTRAVENOUS | Status: AC
  Filled 2021-06-05: qty 1

## 2021-06-05 MED ORDER — DOCUSATE SODIUM 100 MG PO CAPS
100.0000 mg | ORAL_CAPSULE | Freq: Two times a day (BID) | ORAL | Status: DC
  Administered 2021-06-05 – 2021-06-06 (×4): 100 mg via ORAL
  Filled 2021-06-05 (×5): qty 1

## 2021-06-05 MED ORDER — ENOXAPARIN SODIUM 40 MG/0.4ML IJ SOSY
40.0000 mg | PREFILLED_SYRINGE | INTRAMUSCULAR | Status: DC
  Administered 2021-06-05 – 2021-06-07 (×3): 40 mg via SUBCUTANEOUS
  Filled 2021-06-05 (×3): qty 0.4

## 2021-06-05 MED ORDER — ACETAMINOPHEN 325 MG PO TABS: 650.0000 mg | ORAL_TABLET | Freq: Four times a day (QID) | ORAL | Status: DC | PRN

## 2021-06-05 MED ORDER — HYDRALAZINE HCL 20 MG/ML IJ SOLN: 5.0000 mg | INTRAMUSCULAR | Status: DC | PRN

## 2021-06-05 MED ORDER — INSULIN ASPART 100 UNIT/ML IJ SOLN: 0.0000 [IU] | Freq: Every day | INTRAMUSCULAR | Status: DC

## 2021-06-05 MED ORDER — TRAZODONE HCL 50 MG PO TABS: 25.0000 mg | ORAL_TABLET | Freq: Every evening | ORAL | Status: DC | PRN

## 2021-06-05 MED ORDER — FUROSEMIDE 10 MG/ML IJ SOLN
80.0000 mg | Freq: Two times a day (BID) | INTRAMUSCULAR | Status: DC
  Administered 2021-06-05 (×2): 80 mg via INTRAVENOUS
  Filled 2021-06-05 (×2): qty 8

## 2021-06-05 MED ORDER — ONDANSETRON HCL 4 MG PO TABS: 4.0000 mg | ORAL_TABLET | Freq: Four times a day (QID) | ORAL | Status: DC | PRN

## 2021-06-05 MED ORDER — MORPHINE SULFATE (PF) 2 MG/ML IV SOLN: 2.0000 mg | INTRAVENOUS | Status: DC | PRN

## 2021-06-05 MED ORDER — HYDROXYCHLOROQUINE SULFATE 200 MG PO TABS
200.0000 mg | ORAL_TABLET | Freq: Two times a day (BID) | ORAL | Status: DC
  Administered 2021-06-05 – 2021-06-07 (×5): 200 mg via ORAL
  Filled 2021-06-05 (×7): qty 1

## 2021-06-05 MED ORDER — FUROSEMIDE 10 MG/ML IJ SOLN: 40.0000 mg | Freq: Two times a day (BID) | INTRAMUSCULAR | Status: DC

## 2021-06-05 MED ORDER — NITROGLYCERIN IN D5W 200-5 MCG/ML-% IV SOLN
INTRAVENOUS | Status: AC
  Administered 2021-06-05: 10 ug/min via INTRAVENOUS
  Filled 2021-06-05: qty 250

## 2021-06-05 MED ORDER — ASPIRIN 81 MG PO CHEW
81.0000 mg | CHEWABLE_TABLET | Freq: Every day | ORAL | Status: DC
  Administered 2021-06-05 – 2021-06-07 (×3): 81 mg via ORAL
  Filled 2021-06-05 (×3): qty 1

## 2021-06-05 MED ORDER — SACUBITRIL-VALSARTAN 49-51 MG PO TABS
1.0000 | ORAL_TABLET | Freq: Two times a day (BID) | ORAL | Status: DC
  Administered 2021-06-05 – 2021-06-07 (×4): 1 via ORAL
  Filled 2021-06-05 (×6): qty 1

## 2021-06-05 MED ORDER — CLONAZEPAM 0.5 MG PO TABS
0.5000 mg | ORAL_TABLET | Freq: Two times a day (BID) | ORAL | Status: DC
  Administered 2021-06-05 – 2021-06-07 (×5): 0.5 mg via ORAL
  Filled 2021-06-05 (×5): qty 1

## 2021-06-05 MED ORDER — ONDANSETRON HCL 4 MG/2ML IJ SOLN: 4.0000 mg | Freq: Four times a day (QID) | INTRAMUSCULAR | Status: DC | PRN

## 2021-06-05 MED ORDER — EZETIMIBE 10 MG PO TABS
10.0000 mg | ORAL_TABLET | Freq: Every morning | ORAL | Status: DC
  Administered 2021-06-05 – 2021-06-07 (×3): 10 mg via ORAL
  Filled 2021-06-05 (×2): qty 1

## 2021-06-05 MED ORDER — SERTRALINE HCL 50 MG PO TABS
25.0000 mg | ORAL_TABLET | Freq: Every day | ORAL | Status: DC
  Administered 2021-06-05 – 2021-06-07 (×3): 25 mg via ORAL
  Filled 2021-06-05 (×3): qty 1

## 2021-06-05 MED ORDER — ACETAMINOPHEN 650 MG RE SUPP: 650.0000 mg | Freq: Four times a day (QID) | RECTAL | Status: DC | PRN

## 2021-06-05 MED ORDER — NITROGLYCERIN IN D5W 200-5 MCG/ML-% IV SOLN: 0.0000 ug/min | INTRAVENOUS | Status: DC

## 2021-06-05 MED ORDER — INSULIN ASPART 100 UNIT/ML IJ SOLN
0.0000 [IU] | Freq: Three times a day (TID) | INTRAMUSCULAR | Status: DC
  Administered 2021-06-06 – 2021-06-07 (×3): 2 [IU] via SUBCUTANEOUS
  Administered 2021-06-07: 3 [IU] via SUBCUTANEOUS

## 2021-06-05 MED ORDER — MORPHINE SULFATE (PF) 4 MG/ML IV SOLN
4.0000 mg | Freq: Once | INTRAVENOUS | Status: AC
  Administered 2021-06-05: 4 mg via INTRAVENOUS

## 2021-06-05 MED ORDER — FUROSEMIDE 10 MG/ML IJ SOLN
40.0000 mg | Freq: Once | INTRAMUSCULAR | Status: AC
  Administered 2021-06-05: 40 mg via INTRAVENOUS
  Filled 2021-06-05: qty 4

## 2021-06-05 MED ORDER — SODIUM CHLORIDE 0.9% FLUSH
3.0000 mL | Freq: Two times a day (BID) | INTRAVENOUS | Status: DC
  Administered 2021-06-05 – 2021-06-06 (×4): 3 mL via INTRAVENOUS

## 2021-06-05 MED ORDER — LORAZEPAM 2 MG/ML IJ SOLN
0.5000 mg | Freq: Once | INTRAMUSCULAR | Status: AC
  Administered 2021-06-05: 0.5 mg via INTRAVENOUS
  Filled 2021-06-05: qty 1

## 2021-06-05 MED ORDER — POLYETHYLENE GLYCOL 3350 17 G PO PACK: 17.0000 g | PACK | Freq: Every day | ORAL | Status: DC | PRN

## 2021-06-05 MED ORDER — LOSARTAN POTASSIUM 50 MG PO TABS: 100.0000 mg | ORAL_TABLET | Freq: Every day | ORAL | Status: DC

## 2021-06-05 NOTE — Assessment & Plan Note (Addendum)
-  Continue Zocor, Zetia -Check lipids - appear to have last been checked in 2017

## 2021-06-05 NOTE — ED Triage Notes (Signed)
Pt arrived with EMS for SOB. Pt reported indigestion and sob that woke her from sleep. Denies chest pain, c/o nausea and emesis x1, initially clammy then diaphoretic. Initial oxygen sat 87%, unable to tolerate cpap enroute. EMS called STEMI, cancelled per [facility]. On arrival, pt in resp distress, unable to speak in short sentences. Bipap placed

## 2021-06-05 NOTE — Assessment & Plan Note (Addendum)
Patient with no chest pain, no acute coronary syndrome.  Patient will continue with medical with antiplatelet therapy and B blockade.  Continue statin therapy.

## 2021-06-05 NOTE — Assessment & Plan Note (Deleted)
-  Patient with recent admission for NSTEMI and probable Takotsubo cardiomyopathy presenting with worsening SOB and hypoxia to the 80s -She did have chest tightness, as well -CXR consistent with pulmonary edema -Elevated BNP without known baseline -With elevated BNP and abnl CXR, acute decompensated CHF seems probable as diagnosis -Will admit, as per the Emergency HF Mortality Risk Grade.  The patient has:  severe pulmonary edema requiring new O2 therapy - but she has been able to wean off BIPAP and is currently on 4L [place] O2 -Cardiology Dr. Einar Gip is consulting -She had recent echocardiogram - will defer to cardiology about whether to repeat -In order to further reduce morbidity/mortality, the patient has been started on Entresto -Continue ASA -CHF order set utilized -Was given Lasix 40 mg x 1 in ER and will be started on Lasix 80 mg IV BID per cardiology -Continue [place] O2 for now -Consider addition of Bidil or Digoxin if still symptomatic despite guideline-directed therapies, as above -Treatment with SGLT-2-inhibitors reduces CHF-associated hospitalizations and should be considered -Ivabradine and IV iron have also been shown to reduce hospitalizations

## 2021-06-05 NOTE — Assessment & Plan Note (Addendum)
Patient was placed on sertraline with good toleration.  At home patient was taking duloxetine and lorazepam.

## 2021-06-05 NOTE — ED Provider Notes (Signed)
[facility] EMERGENCY DEPARTMENT Provider Note   CSN: [id number] Arrival date & time: 06/05/21  0350     History  Chief Complaint  Patient presents with   Respiratory Distress    [name] is a 76 y.o. female.  The history is provided by the patient and the EMS personnel. The history is limited by the condition of the patient (Severe respiratory distress).  She has history of hypertension, diabetes, hyperlipidemia, coronary artery disease, stroke, Takotsubo cardiomyopathy and comes in because of difficulty breathing which started tonight.  Symptoms are severe.  She denies chest pain, heaviness, tightness, pressure.  There has been n nausea and she vomited once.  There has been minimal cough.  EMS noted initial oxygen saturation of 87% and tried to put her on CPAP, but she was unable to tolerate it.  ECG was obtained and code STEMI activated, canceled by her cardiologist.   Home Medications Prior to Admission medications   Medication Sig Start Date End Date Taking? Authorizing Provider  acetaminophen (TYLENOL) 500 MG tablet Take 1,000 mg by mouth every 6 (six) hours as needed for moderate pain, mild pain or headache.    [provider]  aspirin (ASPIRIN CHILDRENS) 81 MG chewable tablet Chew 1 tablet (81 mg total) by mouth daily. 05/22/21   Adrian Prows, MD  Cholecalciferol (VITAMIN D3) 1.25 MG (50000 UT) CAPS Take 1 capsule by mouth daily.    [provider]  Ciclopirox 1 % shampoo Apply 1 application topically 2 (two) times a week. 05/31/19   [provider]  DULoxetine (CYMBALTA) 30 MG capsule Take 1 capsule (30 mg total) by mouth at bedtime. 06/01/21   Cantwell, Celeste C, PA-C  ezetimibe (ZETIA) 10 MG tablet Take 10 mg by mouth every morning.    [provider]  hydroxychloroquine (PLAQUENIL) 200 MG tablet Take by mouth 2 (two) times daily.    [provider]  LORazepam (ATIVAN) 0.5 MG tablet Take 0.5 mg by mouth every 8  (eight) hours as needed for anxiety.    [provider]  losartan (COZAAR) 100 MG tablet Take 1 tablet (100 mg total) by mouth daily. 06/01/21   Cantwell, Celeste C, PA-C  metFORMIN (GLUCOPHAGE-XR) 500 MG 24 hr tablet Take 500 mg by mouth in the morning and at bedtime. 01/25/15   [provider]  metoprolol succinate (TOPROL-XL) 100 MG 24 hr tablet Take 1 tablet (100 mg total) by mouth daily. Take with or immediately following a meal. 05/23/21   Adrian Prows, MD  Omega-3 Fatty Acids (FISH OIL) 1000 MG CAPS Take by mouth.    [provider]  simvastatin (ZOCOR) 40 MG tablet Take 1 tablet (40 mg total) by mouth daily at 6 PM. 05/22/21   Adrian Prows, MD      Allergies    Vicodin [hydrocodone-acetaminophen], Plavix [clopidogrel], Atorvastatin, Crestor [rosuvastatin calcium], and Lipitor [atorvastatin calcium]    Review of Systems   Review of Systems  Unable to perform ROS: Severe respiratory distress   Physical Exam Updated Vital Signs BP (!) 192/119    Pulse (!) 138    Temp (!) 97.1 F (36.2 C) (Temporal)    Resp (!) 36    SpO2 92%  Physical Exam Vitals and nursing note reviewed.  76 year old female, in acute respiratory distress. Vital signs are significant for elevated heart rate, respiratory rate, blood pressure. Oxygen saturation is 92%, which is normal. Head is normocephalic and atraumatic. PERRLA, EOMI. Oropharynx is clear.  Neck is nontender and supple without adenopathy.  JVD is present at 90 degrees. Back is nontender and there is no CVA tenderness. Lungs have coarse inspiratory and expiratory wheezes and pattern suggestive of cardiac asthma. Chest is nontender. Heart is tachycardic without murmur. Abdomen is soft, flat, nontender without masses or hepatosplenomegaly and peristalsis is normoactive. Extremities have no cyanosis or edema, full range of motion is present. Skin is warm and dry without rash. Neurologic: Mental status is normal, cranial nerves are  intact, moves all extremities equally.  ED Results / Procedures / Treatments   Labs (all labs ordered are listed, but only abnormal results are displayed) Labs Reviewed  RESP PANEL BY RT-PCR (FLU A&B, COVID) ARPGX2  BRAIN NATRIURETIC PEPTIDE  BASIC METABOLIC PANEL  CBC WITH DIFFERENTIAL/PLATELET  I-STAT ARTERIAL BLOOD GAS, ED  TROPONIN I (HIGH SENSITIVITY)    EKG EKG Interpretation  Date/Time:  Tuesday June 05 2021 03:58:17 EST Ventricular Rate:  130 PR Interval:  134 QRS Duration: 135 QT Interval:  299 QTC Calculation: 440 R Axis:   -77 Text Interpretation: Sinus tachycardia Left bundle branch block When compared with ECG of 05/22/2021, HEART RATE has increased Confirmed by Delora Fuel ([id number]) on 06/05/2021 4:01:35 AM  Radiology No results found.  Procedures Procedures  Per my interpretation, cardiac monitor shows sinus tachycardia.  Medications Ordered in ED Medications  nitroGLYCERIN 50 mg in dextrose 5 % 250 mL (0.2 mg/mL) infusion (10 mcg/min Intravenous New Bag/Given 06/05/21 0359)  morphine (PF) 4 MG/ML injection (has no administration in time range)  LORazepam (ATIVAN) injection 0.5 mg (has no administration in time range)  morphine (PF) 4 MG/ML injection 4 mg (4 mg Intravenous Given 06/05/21 0357)    ED Course/ Medical Decision Making/ A&P                           Medical Decision Making Amount and/or Complexity of Data Reviewed Labs: ordered. Radiology: ordered.  Risk Prescription drug management. Decision regarding hospitalization.   Acute respiratory distress.  Physical exam is strongly suggestive of acute pulmonary edema, chest x-ray is ordered.  She is placed on BiPAP with some improvement in respiratory status.  She is noted to be markedly hypertensive, is given a dose of morphine and lorazepam and started on nitroglycerin infusion.  ECG shows left bundle branch block, no evidence of STEMI.  Old records are reviewed showing hospitalization 1/23-1/24  with diagnosis of non-STEMI and Takotsubo cardiomyopathy.  Cardiac catheterization showed no occlusions greater than 50%.  Echocardiogram showed decreased ejection fraction of 60-45%, grade 2 diastolic dysfunction.  I have reviewed the chest x-ray, and I feel that it shows evidence of pulmonary edema.  Radiologist interpretation is pending.  She is given a dose of furosemide.  Patient's blood pressure came down dramatically.  As a matter fact, it got to hypertensive range.  Nitroglycerin infusion was stopped.  Patient was reevaluated and she is feeling much better.  She is no longer using accessory muscles of respiration, but is still on BiPAP.  Initial ABG did show acute respiratory acidosis with PCO2 of 54 and pH of 7.27.  It is mildly elevated at 22.  This may represent demand ischemia, may represent residual of her STEMI since troponin was 1530 on 1/23.  I did discuss the case with her cardiologist, Dr. Einar Gip, who requests patient be admitted to the hospitalist service and states he will see her in consult.  Case is discussed with Dr. Bridgett Larsson  of [facility], who agrees to admit the patient.  CRITICAL CARE Performed by: Delora Fuel Total critical care time: 130 minutes Critical care time was exclusive of separately billable procedures and treating other patients. Critical care was necessary to treat or prevent imminent or life-threatening deterioration. Critical care was time spent personally by me on the following activities: development of treatment plan with patient and/or surrogate as well as nursing, discussions with consultants, evaluation of patient's response to treatment, examination of patient, obtaining history from patient or surrogate, ordering and performing treatments and interventions, ordering and review of laboratory studies, ordering and review of radiographic studies, pulse oximetry and re-evaluation of patient's condition.       Final Clinical Impression(s) / ED  Diagnoses Final diagnoses:  Acute pulmonary edema ([place])  Acute respiratory acidosis ([facility])  Hypertensive urgency  Elevated troponin    Rx / DC Orders ED Discharge Orders     None         Delora Fuel, MD 37/16/96 [phone]
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
## 2021-06-05 NOTE — ED Notes (Signed)
Pink frothy sputum noted in bipap mask and on pt's chin. Oxygen noted to be 88% on bipap; MD and RT aware

## 2021-06-05 NOTE — ED Notes (Signed)
RT at bedside. Pt states she is feeling much better. Removed Bi-Pap to assess if pt able to tolerate. Placed on 2 liters [place]. Pt verbalized comfort at this time.

## 2021-06-05 NOTE — Progress Notes (Signed)
Heart Failure Navigator Progress Note  Assessed for Heart & Vascular TOC clinic readiness.  Patient follows with [facility] and Dr. Einar Gip. If unable to get close cardiology follow up upon DC can consider HV TOC clinic appt.  EF: 35-40%, mild LVH, G1DD  Pricilla Holm, MSN, RN Heart Failure Nurse Navigator [phone]

## 2021-06-05 NOTE — Assessment & Plan Note (Addendum)
Her glucose remained well controlled during her hospitalization. Fasting glucose at discharge is 106.  At home will continue with metformin and empagliflozin has been added to her medical regimen.

## 2021-06-05 NOTE — Assessment & Plan Note (Addendum)
Blood pressure control with metoprolol and entresto Losartan has been discontinued.

## 2021-06-05 NOTE — Assessment & Plan Note (Addendum)
Remained stable 
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
## 2021-06-06 ENCOUNTER — Inpatient Hospital Stay (HOSPITAL_COMMUNITY): Payer: Medicare Other

## 2021-06-06 ENCOUNTER — Encounter (HOSPITAL_COMMUNITY): Payer: Self-pay | Admitting: Internal Medicine

## 2021-06-06 DIAGNOSIS — E785 Hyperlipidemia, unspecified: Secondary | ICD-10-CM

## 2021-06-06 DIAGNOSIS — E1169 Type 2 diabetes mellitus with other specified complication: Secondary | ICD-10-CM

## 2021-06-06 DIAGNOSIS — I160 Hypertensive urgency: Secondary | ICD-10-CM

## 2021-06-06 DIAGNOSIS — G3184 Mild cognitive impairment, so stated: Secondary | ICD-10-CM

## 2021-06-06 DIAGNOSIS — I25110 Atherosclerotic heart disease of native coronary artery with unstable angina pectoris: Secondary | ICD-10-CM

## 2021-06-06 DIAGNOSIS — I10 Essential (primary) hypertension: Secondary | ICD-10-CM

## 2021-06-06 DIAGNOSIS — F419 Anxiety disorder, unspecified: Secondary | ICD-10-CM

## 2021-06-06 LAB — CBC
HCT: 43.4 % (ref 36.0–46.0)
Hemoglobin: 14.5 g/dL (ref 12.0–15.0)
MCH: 28.7 pg (ref 26.0–34.0)
MCHC: 33.4 g/dL (ref 30.0–36.0)
MCV: 85.9 fL (ref 80.0–100.0)
Platelets: 272 10*3/uL (ref 150–400)
RBC: 5.05 MIL/uL (ref 3.87–5.11)
RDW: 13.4 % (ref 11.5–15.5)
WBC: 7.1 10*3/uL (ref 4.0–10.5)
nRBC: 0 % (ref 0.0–0.2)

## 2021-06-06 LAB — BASIC METABOLIC PANEL
Anion gap: 11 (ref 5–15)
BUN: 22 mg/dL (ref 8–23)
CO2: 29 mmol/L (ref 22–32)
Calcium: 8.9 mg/dL (ref 8.9–10.3)
Chloride: 100 mmol/L (ref 98–111)
Creatinine, Ser: 1.14 mg/dL — ABNORMAL HIGH (ref 0.44–1.00)
GFR, Estimated: 50 mL/min — ABNORMAL LOW (ref >60)
Glucose, Bld: 156 mg/dL — ABNORMAL HIGH (ref 70–99)
Potassium: 3.5 mmol/L (ref 3.5–5.1)
Sodium: 140 mmol/L (ref 135–145)

## 2021-06-06 LAB — GLUCOSE, CAPILLARY
Glucose-Capillary: 131 mg/dL — ABNORMAL HIGH (ref 70–99)
Glucose-Capillary: 99 mg/dL (ref 70–99)
Glucose-Capillary: 144 mg/dL — ABNORMAL HIGH (ref 70–99)
Glucose-Capillary: 85 mg/dL (ref 70–99)

## 2021-06-06 LAB — TSH: TSH: 1.714 u[IU]/mL (ref 0.350–4.500)

## 2021-06-06 LAB — BRAIN NATRIURETIC PEPTIDE: B Natriuretic Peptide: 576.8 pg/mL — ABNORMAL HIGH (ref 0.0–100.0)

## 2021-06-06 MED ORDER — FUROSEMIDE 20 MG PO TABS: 20.0000 mg | ORAL_TABLET | Freq: Every day | ORAL | Status: DC

## 2021-06-06 MED ORDER — FUROSEMIDE 20 MG PO TABS
20.0000 mg | ORAL_TABLET | Freq: Two times a day (BID) | ORAL | Status: DC
  Administered 2021-06-06 (×2): 20 mg via ORAL
  Filled 2021-06-06 (×2): qty 1

## 2021-06-06 NOTE — Progress Notes (Signed)
Renal artery duplex has been completed.  Results can be found under chart review under CV PROC. 06/06/2021 10:13 AM Talaya Lamprecht RVT, RDMS

## 2021-06-06 NOTE — TOC Progression Note (Signed)
Transition of Care [facility]) - Progression Note    Patient Details  Name: [name] MRN: [id number] Date of Birth: January 16, 1946  Transition of Care [facility]) CM/SW Contact  Zenon Mayo, RN Phone Number: 06/06/2021, 8:41 AM  Clinical Narrative:     Transition of Care [facility]) Screening Note   Patient Details  Name: [name] Date of Birth: Jul 28, 1945   [facility]) CM/SW Contact:    Zenon Mayo, RN Phone Number: 06/06/2021, 8:41 AM    Patient from home with wife, NSTEMI, CHF TOC  will continue to monitor patient advancement through interdisciplinary progression rounds. If new patient transition needs arise, please place a TOC consult.          Expected Discharge Plan and Services                                                 Social Determinants of Health (SDOH) Interventions    Readmission Risk Interventions No flowsheet data found.

## 2021-06-06 NOTE — Progress Notes (Signed)
Nutrition Brief Note  RD received a consult for Nutritional Goals.   Wt Readings from Last 5 Encounters:  06/06/21 61.2 kg  06/01/21 64.8 kg  05/22/21 63 kg  09/26/20 62.6 kg  03/27/20 63.1 kg    Body mass index is 23.91 kg/m. Patient meets criteria for Normal based on current BMI.   Pt resting in bed, family at bedside.   Pt reports that her appetite has been good and that she was currently "starving" and waiting for her breakfast to be delivered. Pt reports that she has not had any weight loss, except for 3# since being here (pt is on Lasix's). Pt reports that she uses no assistance while ambulating at home.   Current diet order is Heart Healthy, patient is consuming approximately 50% of meals at this time. Labs and medications reviewed.   No nutrition interventions warranted at this time. If nutrition issues arise, please consult RD.     Roxana Hires, RD, LDN Clinical Dietitian See [facility] for contact information.

## 2021-06-06 NOTE — Evaluation (Signed)
Physical Therapy Evaluation Patient Details Name: [name] MRN: [id number] DOB: 09-Mar-1946 Today's Date: 06/06/2021  History of Present Illness  The pt is a 76 yo female presenting 2/7 with SOB, placed on bipap upon arrival. Pt found to have acute pulmonary edema with acute on chronic CHF exacerbation. PMH includes: HTN, DM II, HLD, CAD, CVA, and Takotsubo cardiomyopathy.   Clinical Impression  Pt in bed upon arrival of PT, agreeable to evaluation at this time. Prior to admission the pt was completely independent without need for DME, still driving and volunteering in community, awaiting start of cardiac rehab (no opening until end of Feb/March per pt). The pt now presents with minor limitations in functional mobility, endurance, and dynamic stability, but is safe to return home with intermittent assist from her daughter. The pt was able to demo good mobility for hallway ambulation and stairs, and maintained SpO2 > 90% with all rest and mobility at this time. She will continue to benefit from skilled PT acutely to maintain mobility and progress endurance training, will be safe to return home without follow up therapies at this time. Pt and her daughter encouraged to initiate progressive walking program at home and both expressed understanding.   Gait Speed: 0.27m/s (Gait speed <0.71m/s indicates increased risk of falls and dependence in ADLs)         Recommendations for follow up therapy are one component of a multi-disciplinary discharge planning process, led by the attending physician.  Recommendations may be updated based on patient status, additional functional criteria and insurance authorization.  Follow Up Recommendations No PT follow up    Assistance Recommended at Discharge PRN  Patient can return home with the following       Equipment Recommendations None recommended by PT  Recommendations for Other Services       Functional Status Assessment Patient has had a recent  decline in their functional status and demonstrates the ability to make significant improvements in function in a reasonable and predictable amount of time.     Precautions / Restrictions Precautions Precautions: Other (comment) Precaution Comments: watch SpO2, low fall Restrictions Weight Bearing Restrictions: No      Mobility  Bed Mobility Overal bed mobility: Independent                  Transfers Overall transfer level: Independent Equipment used: None                    Ambulation/Gait Ambulation/Gait assistance: Supervision Gait Distance (Feet): 250 Feet Assistive device: None Gait Pattern/deviations: Step-through pattern Gait velocity: 0.56 m/s Gait velocity interpretation: 1.31 - 2.62 ft/sec, indicative of limited community ambulator   General Gait Details: x2 instances of drifting R/L, but otherwise stable and improved with continued mobility. pt reports gait it slightly slowed. SpO2 94-98% on RA  Stairs Stairs: Yes Stairs assistance: Supervision Stair Management: One rail Right, Step to pattern, Forwards Number of Stairs: 5 General stair comments: VSS on RA     Balance Overall balance assessment: Mild deficits observed, not formally tested                                           Pertinent Vitals/Pain Pain Assessment Pain Assessment: No/denies pain    Home Living Family/patient expects to be discharged to:: Private residence Living Arrangements: Spouse/significant other (he is currently at [facility]) Available Help at  Discharge: Family;Available PRN/intermittently Type of Home: House Home Access: Stairs to enter Entrance Stairs-Rails: Psychiatric nurse of Steps: 3   Home Layout: One level Home Equipment: Conservation officer, nature (2 wheels);BSC/3in1;Crutches Additional Comments: pt spouse currently at SNF following knee replacement, family lives nearby and can assist as needed after    Prior Function Prior Level  of Function : Independent/Modified Independent;Driving             Mobility Comments: pt independent without DME, was exercising at [facility] until NSTEMI, had not yet started cardiac rehab ADLs Comments: independent, driving, volunteers at [place] [facility]     Hand Dominance        Extremity/Trunk Assessment   Upper Extremity Assessment Upper Extremity Assessment: Overall WFL for tasks assessed    Lower Extremity Assessment Lower Extremity Assessment: Overall WFL for tasks assessed    Cervical / Trunk Assessment Cervical / Trunk Assessment: Normal  Communication   Communication: HOH  Cognition Arousal/Alertness: Awake/alert Behavior During Therapy: WFL for tasks assessed/performed Overall Cognitive Status: Within Functional Limits for tasks assessed                                 General Comments: slightly flat affect, but able to answer all questions appropriately and good safety insight        General Comments General comments (skin integrity, edema, etc.): pt on 2L upon my arrival, tolerated RA well with mobility and at rest. left on RA with RN aware    Exercises     Assessment/Plan    PT Assessment Patient needs continued PT services  PT Problem List Cardiopulmonary status limiting activity;Decreased activity tolerance       PT Treatment Interventions DME instruction;Gait training;Stair training;Functional mobility training;Therapeutic activities;Therapeutic exercise;Patient/family education    PT Goals (Current goals can be found in the Care Plan section)  Acute Rehab PT Goals Patient Stated Goal: return to activity/exercise PT Goal Formulation: With patient/family Time For Goal Achievement: 06/20/21 Potential to Achieve Goals: Good    Frequency Min 2X/week        AM-PAC PT "6 Clicks" Mobility  Outcome Measure Help needed turning from your back to your side while in a flat bed without using bedrails?: None Help needed  moving from lying on your back to sitting on the side of a flat bed without using bedrails?: None Help needed moving to and from a bed to a chair (including a wheelchair)?: None Help needed standing up from a chair using your arms (e.g., wheelchair or bedside chair)?: None Help needed to walk in hospital room?: A Little Help needed climbing 3-5 steps with a railing? : A Little 6 Click Score: 22    End of Session Equipment Utilized During Treatment: Gait belt Activity Tolerance: Patient tolerated treatment well Patient left: in bed;with call bell/phone within reach;with family/visitor present Nurse Communication: Mobility status PT Visit Diagnosis: Other abnormalities of gait and mobility (R26.89)    Time: 8921-1941 PT Time Calculation (min) (ACUTE ONLY): 21 min   Charges:   PT Evaluation $PT Eval Low Complexity: 1 Low          West Carbo, PT, DPT   Acute Rehabilitation Department Pager #: [phone]  Sandra Cockayne 06/06/2021, 9:24 AM

## 2021-06-06 NOTE — Evaluation (Signed)
Occupational Therapy Evaluation Patient Details Name: [name] MRN: [id number] DOB: [birth year] Today's Date: 06/06/2021   History of Present Illness The pt is a 76 yo female presenting 2/7 with SOB, placed on bipap upon arrival. Pt found to have acute pulmonary edema with acute on chronic CHF exacerbation. PMH includes: HTN, DM II, HLD, CAD, CVA, and Takotsubo cardiomyopathy.   Clinical Impression   PTA patient was living with her spouse (current at SNF rehab) and was grossly I with ADLs/IADLs without AD. Patient currently functioning near baseline demonstrating observed ADLs with supervision to Min guard A. Patient also limited by deficits listed below including generalized weakness and mild balance deficits and would benefit from continued acute OT services in prep for safe d/c home. Daughter present at bedside confirms families ability to provide assist at time of d/c.       Recommendations for follow up therapy are one component of a multi-disciplinary discharge planning process, led by the attending physician.  Recommendations may be updated based on patient status, additional functional criteria and insurance authorization.   Follow Up Recommendations  No OT follow up    Assistance Recommended at Discharge Intermittent Supervision/Assistance  Patient can return home with the following      Functional Status Assessment  Patient has had a recent decline in their functional status and demonstrates the ability to make significant improvements in function in a reasonable and predictable amount of time.  Equipment Recommendations  None recommended by OT    Recommendations for Other Services       Precautions / Restrictions Precautions Precautions: Other (comment) Precaution Comments: watch SpO2, low fall Restrictions Weight Bearing Restrictions: No      Mobility Bed Mobility Overal bed mobility: Independent                  Transfers Overall transfer level:  Independent Equipment used: None                      Balance Overall balance assessment: Mild deficits observed, not formally tested                                         ADL either performed or assessed with clinical judgement   ADL Overall ADL's : Needs assistance/impaired Eating/Feeding: Independent   Grooming: Supervision/safety;Standing   Upper Body Bathing: Supervision/ safety;Sitting   Lower Body Bathing: Min guard;Sit to/from stand   Upper Body Dressing : Supervision/safety;Sitting   Lower Body Dressing: Min guard;Sit to/from stand   Toilet Transfer: Copy Details (indicate cue type and reason): To standard height commode in bathroom without AD.                 Vision   Vision Assessment?: No apparent visual deficits     Perception     Praxis      Pertinent Vitals/Pain Pain Assessment Pain Assessment: No/denies pain     Hand Dominance     Extremity/Trunk Assessment Upper Extremity Assessment Upper Extremity Assessment: Overall WFL for tasks assessed   Lower Extremity Assessment Lower Extremity Assessment: Overall WFL for tasks assessed   Cervical / Trunk Assessment Cervical / Trunk Assessment: Normal   Communication Communication Communication: HOH   Cognition Arousal/Alertness: Awake/alert Behavior During Therapy: WFL for tasks assessed/performed Overall Cognitive Status: Within Functional Limits for tasks assessed  General Comments  pt on 2L upon my arrival, tolerated RA well with mobility and at rest. left on RA with RN aware    Exercises     Shoulder Instructions      Home Living Family/patient expects to be discharged to:: Private residence Living Arrangements: Spouse/significant other (he is currently at [facility]) Available Help at Discharge: Family;Available PRN/intermittently Type of Home: House Home Access: Stairs to  enter [employer] of Steps: 3 Entrance Stairs-Rails: Right;Left Home Layout: One level     Bathroom Shower/Tub: Tub/shower unit;Walk-in shower   Bathroom Toilet: Standard     Home Equipment: Conservation officer, nature (2 wheels);BSC/3in1;Crutches   Additional Comments: pt spouse currently at SNF following knee replacement, family lives nearby and can assist as needed after      Prior Functioning/Environment Prior Level of Function : Independent/Modified Independent;Driving             Mobility Comments: pt independent without DME, was exercising at [facility] until NSTEMI, had not yet started cardiac rehab ADLs Comments: independent, driving, 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 OT Goal Formulation: With patient Time For Goal Achievement: 06/20/21 Potential to Achieve Goals: Good ADL Goals Additional ADL Goal #1: Patient will completed ADLs with Mod I and AD/DME in prep for safe d/c home. Additional ADL Goal #2: Patient will recall and demo 2 energy conservation techniques in prep for ADLs/IADLs.  OT Frequency: Min 2X/week    Co-evaluation              AM-PAC OT "6 Clicks" Daily Activity     Outcome Measure Help from another person eating meals?: None Help from another person taking care of personal grooming?: A Little Help from another person toileting, which includes using toliet, bedpan, or urinal?: A Little Help from another person bathing (including washing, rinsing, drying)?: A Little Help from another person to put on and taking off regular upper body  clothing?: A Little Help from another person to put on and taking off regular lower body clothing?: A Little 6 Click Score: 19   End of Session Equipment Utilized During Treatment: Gait belt Nurse Communication: Mobility status  Activity Tolerance: Patient tolerated treatment well Patient left: in chair;with call bell/phone within reach;with family/visitor present  OT Visit Diagnosis: Unsteadiness on feet (R26.81);Muscle weakness (generalized) (M62.81)                Time: 1113-1130 OT Time Calculation (min): 17 min Charges:  OT General Charges $OT Visit: 1 Visit OT Evaluation $OT Eval Low Complexity: 1 Low  Alayiah Fontes H. OTR/L Supplemental OT, Department of rehab services [phone]  Heily Carlucci R H. 06/06/2021, 11:43 AM

## 2021-06-06 NOTE — Hospital Course (Addendum)
Mrs. Shevlin was admitted to the hospital with the working diagnosis of acute heart failure decompensation.   76 yo female with the past medical history of HTN, dyslipidemia, CAD, T2DM and chronic heart failure who presented with dyspnea. Recent hospitalization 01/23 to 01/24 for NSTEMI and stress induced cardiomyopathy. At home patient continue to have progressive dyspnea that became acutely worse on the day of hospitalization. EMS was called and patient was transported to the ED. On her initial physical examination her blood pressure was 93/65, HR 87. RR 16 to 19 and oxygen saturation 91%. Her lungs had no wheezing, heart with S1 and S2 present and rhythmic, abdomen soft and no lower extremity edema.   Na 139, K 3,7, Cl 103, bicarbonate 22, glucose 217 BUN 16 and cr 0,89 ABG 7.26, 54.4, 87, 26 and 95% saturation of oxygen  Wbc 13,5. Hgb 17,6, hct 54 and plt 373  SARS COVID 19 negative   Chest radiograph with bilateral interstitial infiltrates, predominantly at lower lobes, positive hilar vascular congestion.   EKG 130 bpm, left axis deviation, left anterior fascicular block, left bundle branch block, sinus rhythm with q wave V1 to V3, no significant ST segment or T wave changes.   Patient was placed on iv furosemide for diuresis with good toleration.  Increased work of work of breathing and patient was placed on non invasive mechanical ventilation.   Oxygenation and dyspnea improved, patient was transitioned to nasal cannula and then room air with good toleration.  Continue diuretic therapy at home.

## 2021-06-06 NOTE — Assessment & Plan Note (Addendum)
Patient was admitted to the cardiac ward, she was placed on aggressive diuresis, negative fluid was achieved with significant improvement in her symptoms.   Echocardiogram with LV EF 35 to 40%, moderate hypokinesis of the left ventricular basal mid inferior and inferior septal wall. No significant valvular disease.   Patient will continue heart failure management with metoprolol, entresto and empagliflozin. Continue diuresis with furosemide and close follow up renal function and electrolytes as outpatient.   Acute hypercapnic respiratory failure due to heart failure exacerbation, clinically resolved.

## 2021-06-06 NOTE — Progress Notes (Signed)
Progress Note   Patient: [name] QQV:[id number] DOB: [birth year] DOA: 06/05/2021     1 DOS: the patient was seen and examined on 06/06/2021   Brief hospital course: [name] was admitted to the hospital with the working diagnosis of acute heart failure decompensation.   76 yo female with the past medical history of HTN, dyslipidemia, CAD, T2DM and chronic heart failure who presented with dyspnea. Recent hospitalization 01/23 to 01/24 for NSTEMI and stress induced cardiomyopathy. At home patient continue to have progressive dyspnea that became acutely worse on the day of hospitalization. EMS was called and patient was transported to the ED. On her initial physical examination her blood pressure was 93/65, HR 87. RR 16 to 19 and oxygen saturation 91%. Her lungs had no wheezing, heart with S1 and S2 present and rhythmic, abdomen soft and no lower extremity edema.   Na 139, K 3,7, Cl 103, bicarbonate 22, glucose 217 BUN 16 and cr 0,89 ABG 7.26, 54.4, 87, 26 and 95% saturation of oxygen  Wbc 13,5. Hgb 17,6, hct 54 and plt 373  SARS COVID 19 negative   Chest radiograph with bilateral interstitial infiltrates, predominantly at lower lobes, positive hilar vascular congestion.   EKG 130 bpm, left axis deviation, left anterior fascicular block, left bundle branch block, sinus rhythm with q wave V1 to V3, no significant ST segment or T wave changes.   Patient has been placed on iv furosemide for diuresis with good toleration.  Increased work of work of breathing and patient was placed on non invasive mechanical ventilation.    Assessment and Plan Assessment and Plan: * Acute on chronic combined systolic and diastolic CHF (congestive heart failure) ([place])- (present on admission) Patient with improvement in volume status,  Documented urine output is 2,200 cc  Echocardiogram with LV EF 35 to 40%, moderate hypokinesis of the left ventricular basal mid inferior and inferior septal wall. No  significant valvular disease.   Blood pressure 105 to 115 mmHg.   Plan to continue diuresis with furosemide 20 mg po bid (patient at home was not taking diuretic therapy) Continue with metoprolol succinate and entresto.   Acute hypercapnic respiratory failure due to heart failure exacerbation, clinically resolved.   Coronary artery disease involving native coronary artery of native heart with unstable angina pectoris ([place])- (present on admission) Patient with no chest pain. Continue medical therapy with antiplatelet therapy and B blockade.  Continue statin therapy.   Essential hypertension- (present on admission) Continue blood pressure control with metoprolol and entresto.   Type 2 diabetes mellitus with hyperlipidemia (HCC) Continue glucose cover and monitoring with insulin sliding scale.  Fasting glucose this am 156  Continue with ezetimibe and simvastatin.   MCI (mild cognitive impairment) with memory loss- (present on admission) No confusion or agitation.   Anxiety- (present on admission) Continue with setraline and as needed clonazepam.   Hypokalemia- (present on admission) Continue K correction to target K of 4,  Renal function stable with serum cr at 1,14 and bicarbonate at 29. Follow up renal function in am, check Mg.   Hyperlipidemia-resolved as of 06/06/2021, (present on admission) -Continue Zocor, Zetia -Check lipids - appear to have last been checked in 2017        Subjective: Patient is out of bed to the chair, her daughter is at the bedside, her dyspnea has improved but not back to baseline no chest pain no edema  Objective BP (!) 105/59 (BP Location: Left Arm)    Pulse 76  Temp 98.6 F (37 C) (Oral)    Resp 18    Ht 5\' 3"  (1.6 m)    Wt 61.2 kg    SpO2 93%    BMI 23.91 kg/m   Neurology awake and alert ENT no pallor  Cardiovascular heart with S1 and S2 present and rhythmic, no gallops or murmurs No JVD No lower extremity edema  Pulmonary  positive  rales at bases, no wheezing or rhonchi  Abdominal soft and non tender Skin Musculoskeletal   Data Reviewed:    Family Communication: I spoke with patient's daughter at the bedside, we talked in detail about patient's condition, plan of care and prognosis and all questions were addressed.   Disposition: Status is: Inpatient Remains inpatient appropriate because: heart failure management.                Author: Tawni Millers, MD 06/06/2021 1:00 PM  For on call review [email or web address].

## 2021-06-06 NOTE — Progress Notes (Signed)
Mobility Specialist Progress Note:   06/06/21 1620  Mobility  Activity Ambulated independently in hallway  Level of Assistance Independent  Assistive Device None  Distance Ambulated (ft) 470 ft  Activity Response Tolerated well  $Mobility charge 1 Mobility   Pt eager for ambulation this afternoon. Asx throughout session, pt left sitting up in chair with daughter present.   Nelta Numbers Mobility Specialist  Phone [phone]

## 2021-06-06 NOTE — Assessment & Plan Note (Addendum)
Renal function remained stable, her K was corrected with KCL. At her discharge her serum cr is 1,0 with K at 3,6 and serum bicarbonate at 28. Continue diuresis with furosemide along with KCL supplementation. Follow up renal function and electrolytes as outpatient.

## 2021-06-06 NOTE — Progress Notes (Addendum)
Subjective:  Patient's daughter is present at the bedside.  Patient is currently doing well, states that she is ready to go home.  Dyspnea significantly improved and essentially asymptomatic and is walking in the hallway without any discomfort or dyspnea.  Intake/Output from previous day:  I/O last 3 completed shifts: In: 1610 [P.O.:1137; I.V.:0] Out: 1400 [Urine:1400] No intake/output data recorded.  Blood pressure (!) 107/54, pulse 71, temperature (!) 97.5 F (36.4 C), temperature source Oral, resp. rate 18, height 5' 3"  (1.6 m), weight 134 lb 12.8 oz (61.1 kg), SpO2 96 %. Physical Exam Neck:     Vascular: No carotid bruit or JVD.  Cardiovascular:     Rate and Rhythm: Normal rate and regular rhythm.     Pulses: Intact distal pulses.     Heart sounds: Murmur heard.  Early systolic murmur is present with a grade of 2/6 at the upper right sternal border.    No gallop.  Pulmonary:     Effort: Pulmonary effort is normal.     Breath sounds: Normal breath sounds.  Abdominal:     General: Bowel sounds are normal.     Palpations: Abdomen is soft.  Musculoskeletal:     Right lower leg: No edema.     Left lower leg: No edema.    Lab Results: BMP BNP (last 3 results) Recent Labs    06/05/21 0355 06/06/21 0436  BNP 1,894.1* 576.8*    ProBNP (last 3 results) No results for input(s): PROBNP in the last 8760 hours. BMP Latest Ref Rng & Units 06/07/2021 06/06/2021 06/05/2021  Glucose 70 - 99 mg/dL 106(H) 156(H) -  BUN 8 - 23 mg/dL 27(H) 22 -  Creatinine 0.44 - 1.00 mg/dL 1.07(H) 1.14(H) -  Sodium 135 - 145 mmol/L 140 140 137  Potassium 3.5 - 5.1 mmol/L 3.6 3.5 3.2(L)  Chloride 98 - 111 mmol/L 103 100 -  CO2 22 - 32 mmol/L 28 29 -  Calcium 8.9 - 10.3 mg/dL 8.9 8.9 -   Hepatic Function Latest Ref Rng & Units 06/21/2015  Total Protein 6.5 - 8.1 g/dL 6.9  Albumin 3.5 - 5.0 g/dL 4.0  AST 15 - 41 U/L 22  ALT 14 - 54 U/L 22  Alk Phosphatase 38 - 126 U/L 72  Total Bilirubin 0.3 - 1.2  mg/dL 0.2(L)   CBC Latest Ref Rng & Units 06/06/2021 06/05/2021 06/05/2021  WBC 4.0 - 10.5 K/uL 7.1 - 13.5(H)  Hemoglobin 12.0 - 15.0 g/dL 14.5 16.0(H) 17.6(H)  Hematocrit 36.0 - 46.0 % 43.4 47.0(H) 54.0(H)  Platelets 150 - 400 K/uL 272 - 373   Lipid Panel     Component Value Date/Time   CHOL 140 06/05/2021 0741   TRIG 47 06/05/2021 0741   HDL 67 06/05/2021 0741   CHOLHDL 2.1 06/05/2021 0741   VLDL 9 06/05/2021 0741   LDLCALC 64 06/05/2021 0741   Cardiac Panel (last 3 results) No results for input(s): CKTOTAL, CKMB, TROPONINI, RELINDX in the last 72 hours.  HEMOGLOBIN A1C Lab Results  Component Value Date   HGBA1C 6.0 (H) 06/05/2021   MPG 125.5 06/05/2021   TSH Recent Labs    06/06/21 0436  TSH 1.714   Radiology:     Imaging Results (Last 69 hours)[] Expand by Default  DG Chest Port 1 View   Result Date: 06/05/2021 CLINICAL DATA:  Shortness of breath EXAM: PORTABLE CHEST 1 VIEW COMPARISON:  06/22/2015 FINDINGS: Confluent bilateral airspace disease. Stable heart size and mediastinal contours. No visible effusion or air  leak. IMPRESSION: Confluent airspace disease, symmetric and likely CHF. Electronically Signed   By: Jorje Guild M.D.   On: 06/05/2021 04:38       Personally reviewed. Cardiac Studies:    Left Heart Catheterization 05/21/21:  LV: 137/5, EDP 15 mmHg.  Initially EDP was 22 mmHg.  Ao: 140/60, mean 90 mmHg.  There was no pressure gradient across aortic valve. LVEF 50%, apical akinesis. LM: Large vessel, mildly calcified. CX: Large vessel.  Gives origin to a small to moderate-sized OM1 which has ostial 30% stenosis.  Circumflex at the origin of the OM1 has a focal 30 to 40% stenosis. LAD: Mid LAD has moderate luminal irregularity constituting a 50% mildly calcific stenosis.  Gives origin to a very large D1 which traverses all the way to the apex.  LAD ends at the apex. RCA: Dominant.  Mid right has a 30 to 40% stenosis.  Impression: Findings are consistent  with moderate coronary artery disease with wall motion abnormality consistent with Takotsubo cardiomyopathy.  We will obtain echocardiogram to confirm this.  Medical management for CAD as there is no culprit vessel.   Echocardiogram 05/21/2021:  1. Left ventricular ejection fraction, by estimation, is 35 to 40%. The left ventricle has moderately decreased function. The left ventricle demonstrates regional wall motion abnormalities (see scoring diagram/findings for description). Left ventricular  diastolic parameters are consistent with Grade II diastolic dysfunction (pseudonormalization). Elevated left ventricular end-diastolic pressure. There is severe hypokinesis of the left ventricular, mid-apical septal wall, anterior wall and apical  segment. There is severe hypokinesis of the left ventricular, apical inferior segment and apical segment.  2. Right ventricular systolic function is normal. The right ventricular size is normal. There is normal pulmonary artery systolic pressure. The estimated right ventricular systolic pressure is 47.6 mmHg.  3. Left atrial size was mildly dilated.  4. The mitral valve is normal in structure. Mild mitral valve regurgitation.  5. The aortic valve is normal in structure. Aortic valve regurgitation is not visualized. No aortic stenosis is present.  Echocardiogram 06/01/2021:    1. Left ventricular ejection fraction, by estimation, is 35 to 40%. The left ventricle has moderately decreased function. The left ventricle demonstrates regional wall motion abnormalities (see scoring diagram/findings for description). Left ventricular  diastolic parameters are consistent with Grade II diastolic dysfunction (pseudonormalization). Elevated left ventricular end-diastolic pressure. There is severe hypokinesis of the left ventricular, mid-apical septal wall, anterior wall and apical  segment. There is severe hypokinesis of the left ventricular, apical inferior segment and apical  segment.  2. Right ventricular systolic function is normal. The right ventricular size is normal. There is normal pulmonary artery systolic pressure. The estimated right ventricular systolic pressure is 54.6 mmHg.  3. Left atrial size was mildly dilated.  4. The mitral valve is normal in structure. Mild mitral valve regurgitation.  5. The aortic valve is normal in structure. Aortic valve regurgitation is not visualized. No aortic stenosis is present.  Renal artery duplex 06/06/2021: Right: Abnormal right Resistive Index. No evidence of right renal artery stenosis. RRV flow present. Normal size right kidney.  Left:  1-59% stenosis of the left renal artery. Abnormal left  Resisitve Index. LRV flow present. Cyst(s) noted. Normal size of left kidney.  Mesenteric: Normal Celiac artery and Superior Mesenteric artery  Findings:  1-59% stenosis seen in mid-distal portion of aorta (heavily calcified).      EKG:    EKG 06/05/2021: Sinus tachycardia at rate of 130 bpm, left axis deviation, left anterior  fascicular block.  IVCD, anterolateral infarct old.  Compared to 05/21/2021, sinus tachycardia new.   However compared to 06/03/2021, incomplete left bundle branch block or atypical left bundle branch block not present however there was marked ST-T wave abnormality suggestive of inferior and anterolateral subendocardial infarct versus ischemia.    Medications and allergies    Allergies  Allergen Reactions   Vicodin [Hydrocodone-Acetaminophen] Swelling    Pt tolerates plain Tylenol   Plavix [Clopidogrel] Hives    Severe itching   Atorvastatin Rash   Crestor [Rosuvastatin Calcium] Other (See Comments)    MYALGIA   Lipitor [Atorvastatin Calcium] Other (See Comments)    MYALGIA     aspirin  81 mg Oral Daily   clonazePAM  0.5 mg Oral BID   docusate sodium  100 mg Oral BID   enoxaparin (LOVENOX) injection  40 mg Subcutaneous Q24H   ezetimibe  10 mg Oral q morning   furosemide  20 mg Oral BID    hydroxychloroquine  200 mg Oral BID   insulin aspart  0-15 Units Subcutaneous TID WC   insulin aspart  0-5 Units Subcutaneous QHS   metoprolol succinate  100 mg Oral Daily   potassium chloride  20 mEq Oral BID   sacubitril-valsartan  1 tablet Oral BID   sertraline  25 mg Oral Daily   simvastatin  40 mg Oral q1800   sodium chloride flush  3 mL Intravenous Q12H   Continuous Infusions:  nitroGLYCERIN Stopped (06/05/21 0400)   PRN Meds:.acetaminophen **OR** acetaminophen, bisacodyl, hydrALAZINE, morphine injection, ondansetron **OR** ondansetron (ZOFRAN) IV, polyethylene glycol, traZODone  Assessment/Plan:  [name]  is a 76 y.o. Caucasian female patient with mild cognitive disorder, hypertension, hyperlipidemia, diet-controlled diabetes mellitus, history of remote left thalamic lacunar infarct in 2015, polymyalgia rheumatica who presented with ACS by picture on 05/21/2021 and coronary angiography at that time at urgent basis revealed moderate coronary disease and findings consistent with Takotsubo cardiomyopathy.  Admitted yesterday on 06/05/2021 with acute pulm edema.  Impression  1.  Acute on chronic systolic heart failure 2.  Primary hypertension 3.  Acute renal failure stage IIIa in view of cardiorenal syndrome and aggressive diuresis. 4.  Nonhemodynamically significant aortic atherosclerosis and calcific abdominal aortic stenosis and left renal artery stenosis.  Recommendation: Discontinue IV Lasix, clinical symptoms of heart failure is improved.  Change to p.o. Lasix 20 mg twice daily, continue potassium supplements in view of hypokalemia.  Continue Entresto for now, metoprolol succinate for symptoms.  Renal function remains stable, will start Jardiance 10 mg daily, decrease furosemide from 20 mg twice daily oral to 20 mg daily.  Can be discharged home with outpatient follow-up, we will see her back in the office in 1 week.  I do not think left renal artery stenosis is the etiology  for her presentation, continue medical management for now and if she has recurrence, could consider renal angiography.  D/W Primary team.     Adrian Prows, MD, [facility] 06/07/2021, 7:25 AM Office: [phone] Fax: [phone] Pager: [phone]

## 2021-06-07 ENCOUNTER — Other Ambulatory Visit (HOSPITAL_COMMUNITY): Payer: Self-pay

## 2021-06-07 ENCOUNTER — Telehealth: Payer: Self-pay

## 2021-06-07 DIAGNOSIS — M069 Rheumatoid arthritis, unspecified: Secondary | ICD-10-CM | POA: Diagnosis present

## 2021-06-07 LAB — BASIC METABOLIC PANEL
Anion gap: 9 (ref 5–15)
BUN: 27 mg/dL — ABNORMAL HIGH (ref 8–23)
CO2: 28 mmol/L (ref 22–32)
Calcium: 8.9 mg/dL (ref 8.9–10.3)
Chloride: 103 mmol/L (ref 98–111)
Creatinine, Ser: 1.07 mg/dL — ABNORMAL HIGH (ref 0.44–1.00)
GFR, Estimated: 53 mL/min — ABNORMAL LOW (ref >60)
Glucose, Bld: 106 mg/dL — ABNORMAL HIGH (ref 70–99)
Potassium: 3.6 mmol/L (ref 3.5–5.1)
Sodium: 140 mmol/L (ref 135–145)

## 2021-06-07 LAB — GLUCOSE, CAPILLARY
Glucose-Capillary: 139 mg/dL — ABNORMAL HIGH (ref 70–99)
Glucose-Capillary: 184 mg/dL — ABNORMAL HIGH (ref 70–99)

## 2021-06-07 LAB — MAGNESIUM: Magnesium: 1.9 mg/dL (ref 1.7–2.4)

## 2021-06-07 MED ORDER — SERTRALINE HCL 25 MG PO TABS
25.0000 mg | ORAL_TABLET | Freq: Every day | ORAL | 0 refills | Status: DC
  Filled 2021-06-07: qty 30, 30d supply, fill #0

## 2021-06-07 MED ORDER — EMPAGLIFLOZIN 10 MG PO TABS
10.0000 mg | ORAL_TABLET | Freq: Every day | ORAL | 0 refills | Status: DC
  Filled 2021-06-07: qty 30, 30d supply, fill #0

## 2021-06-07 MED ORDER — SACUBITRIL-VALSARTAN 49-51 MG PO TABS
1.0000 | ORAL_TABLET | Freq: Two times a day (BID) | ORAL | 0 refills | Status: DC
Start: 2021-06-07 — End: 2021-07-04
  Filled 2021-06-07: qty 60, 30d supply, fill #0

## 2021-06-07 MED ORDER — EMPAGLIFLOZIN 10 MG PO TABS
10.0000 mg | ORAL_TABLET | Freq: Every day | ORAL | Status: DC
  Administered 2021-06-07: 10 mg via ORAL
  Filled 2021-06-07: qty 1

## 2021-06-07 MED ORDER — CLONAZEPAM 0.5 MG PO TABS
0.5000 mg | ORAL_TABLET | Freq: Two times a day (BID) | ORAL | 0 refills | Status: DC | PRN
  Filled 2021-06-07: qty 10, 5d supply, fill #0

## 2021-06-07 MED ORDER — FUROSEMIDE 20 MG PO TABS
20.0000 mg | ORAL_TABLET | Freq: Every day | ORAL | 0 refills | Status: DC
  Filled 2021-06-07: qty 30, 30d supply, fill #0

## 2021-06-07 MED ORDER — POTASSIUM CHLORIDE CRYS ER 20 MEQ PO TBCR
20.0000 meq | EXTENDED_RELEASE_TABLET | Freq: Every day | ORAL | 0 refills | Status: DC
  Filled 2021-06-07: qty 30, 30d supply, fill #0

## 2021-06-07 MED ORDER — FUROSEMIDE 20 MG PO TABS
20.0000 mg | ORAL_TABLET | Freq: Every day | ORAL | Status: DC
  Administered 2021-06-07: 20 mg via ORAL
  Filled 2021-06-07: qty 1

## 2021-06-07 NOTE — Telephone Encounter (Signed)
Location of hospitalization: [facility] Reason for hospitalization: Monday morning pt woke up at 3 am and was SOB and felt funny, when she went to lay back down. She was having a hard time falling asleep because she was scare due to her previous heart attack  Date of discharge: 06/07/2021 Date of first communication with patient: today Person contacting patient: Me Current symptoms: tired and legs are weak Do you understand why you were in the Hospital: Yes Questions regarding discharge instructions: None Where were you discharged to: Home Medications reviewed: Yes Allergies reviewed: Yes Dietary changes reviewed: Yes. Discussed low fat and low salt diet. Heart healthy diet. Referals reviewed: NA Activities of Daily Living: Able to with mild limitations Any transportation issues/concerns: None Any patient concerns: None Confirmed importance & date/time of Follow up appt: Yes Confirmed with patient if condition begins to worsen call. Pt was given the office number and encouraged to call back with questions or concerns: Yes

## 2021-06-07 NOTE — Progress Notes (Signed)
Went over discharge instructions with patient's and patient's daughter at bedside. Went over medication changes and follow up appointments with patient and patient's daughter. Patient verbalize understanding of medication changes and follow-up appointments. RN educated patient and patient's daughter on the importance of daily weights and BP monitoring. Patient and patient's daughter verbalize understanding. PIV has been removed by NT and tele monitor has been removed. CCMD has been notified of discharge. Patient is getting dressed, transportation is in the room. Awaiting TOC meds.

## 2021-06-07 NOTE — Assessment & Plan Note (Signed)
Continue with hydroxychloroquine

## 2021-06-07 NOTE — Discharge Summary (Signed)
Physician Discharge Summary   Patient: [name] MRN: [id number] DOB: 19-Jul-1945  Admit date:     06/05/2021  Discharge date: 06/07/21  Discharge Physician: Jimmy Picket Kyron Schlitt   PCP: Harlan Stains, MD   Recommendations at discharge:    Patient has been placed on empagliflozin Added furosemide 20 mg daily along with K supplementation Changed losartan to Entresto Follow up renal function in 7 days.   Discharge Diagnoses: Principal Problem:   Acute on chronic combined systolic and diastolic CHF (congestive heart failure) (HCC) Active Problems:   Coronary artery disease involving native coronary artery of native heart with unstable angina pectoris (HCC)   Essential hypertension   Type 2 diabetes mellitus with hyperlipidemia (HCC)   MCI (mild cognitive impairment) with memory loss   Anxiety   Hypokalemia  Resolved Problems:   Hypertension   Hyperlipidemia   Hospital Course: [name] was admitted to the hospital with the working diagnosis of acute heart failure decompensation.   76 yo female with the past medical history of HTN, dyslipidemia, CAD, T2DM and chronic heart failure who presented with dyspnea. Recent hospitalization 01/23 to 01/24 for NSTEMI and stress induced cardiomyopathy. At home patient continue to have progressive dyspnea that became acutely worse on the day of hospitalization. EMS was called and patient was transported to the ED. On her initial physical examination her blood pressure was 93/65, HR 87. RR 16 to 19 and oxygen saturation 91%. Her lungs had no wheezing, heart with S1 and S2 present and rhythmic, abdomen soft and no lower extremity edema.   Na 139, K 3,7, Cl 103, bicarbonate 22, glucose 217 BUN 16 and cr 0,89 ABG 7.26, 54.4, 87, 26 and 95% saturation of oxygen  Wbc 13,5. Hgb 17,6, hct 54 and plt 373  SARS COVID 19 negative   Chest radiograph with bilateral interstitial infiltrates, predominantly at lower lobes, positive hilar vascular  congestion.   EKG 130 bpm, left axis deviation, left anterior fascicular block, left bundle branch block, sinus rhythm with q wave V1 to V3, no significant ST segment or T wave changes.   Patient has been placed on iv furosemide for diuresis with good toleration.  Increased work of work of breathing and patient was placed on non invasive mechanical ventilation.    Assessment and Plan: * Acute on chronic combined systolic and diastolic CHF (congestive heart failure) ([place])- (present on admission) Patient was admitted to the cardiac ward, she was placed on aggressive diuresis, negative fluid was achieved with significant improvement in her symptoms.   Echocardiogram with LV EF 35 to 40%, moderate hypokinesis of the left ventricular basal mid inferior and inferior septal wall. No significant valvular disease.   Patient will continue heart failure management with metoprolol, entresto and empagliflozin. Continue diuresis with furosemide and close follow up renal function and electrolytes as outpatient.   Acute hypercapnic respiratory failure due to heart failure exacerbation, clinically resolved.   Coronary artery disease involving native coronary artery of native heart with unstable angina pectoris ([place])- (present on admission) Patient with no chest pain, no acute coronary syndrome.  Patient will continue with medical with antiplatelet therapy and B blockade.  Continue statin therapy.   Essential hypertension- (present on admission) Blood pressure control with metoprolol and entresto Losartan has been discontinued.   Type 2 diabetes mellitus with hyperlipidemia (HCC) Her glucose remained well controlled during her hospitalization. Fasting glucose at discharge is 106.  At home will continue with metformin and empagliflozin has been added to her  medical regimen.   MCI (mild cognitive impairment) with memory loss- (present on admission) Remained stable.   Anxiety- (present on  admission) Patient was placed on sertraline with good toleration.  At home patient was taking duloxetine and lorazepam.   RA (rheumatoid arthritis) ([place])- (present on admission) Continue with hydroxychloroquine   Hypokalemia- (present on admission) Renal function remained stable, her K was corrected with KCL. At her discharge her serum cr is 1,0 with K at 3,6 and serum bicarbonate at 28. Continue diuresis with furosemide along with KCL supplementation. Follow up renal function and electrolytes as outpatient.   Hyperlipidemia-resolved as of 06/06/2021, (present on admission) -Continue Zocor, Zetia -Check lipids - appear to have last been checked in 2017           Consultants: cardiology  Procedures performed: none   Disposition: Home Diet recommendation:  Cardiac diet  DISCHARGE MEDICATION: Allergies as of 06/07/2021       Reactions   Vicodin [hydrocodone-acetaminophen] Swelling   Pt tolerates plain Tylenol   Plavix [clopidogrel] Hives   Severe itching   Atorvastatin Rash   Crestor [rosuvastatin Calcium] Other (See Comments)   MYALGIA   Lipitor [atorvastatin Calcium] Other (See Comments)   MYALGIA        Medication List     STOP taking these medications    DULoxetine 30 MG capsule Commonly known as: Cymbalta   LORazepam 0.5 MG tablet Commonly known as: ATIVAN   losartan 100 MG tablet Commonly known as: COZAAR       TAKE these medications    acetaminophen 500 MG tablet Commonly known as: TYLENOL Take 1,000 mg by mouth every 6 (six) hours as needed for moderate pain, mild pain or headache.   aspirin 81 MG chewable tablet Commonly known as: Aspirin Childrens Chew 1 tablet (81 mg total) by mouth daily.   Ciclopirox 1 % shampoo Apply 1 application topically 2 (two) times a week.   clonazePAM 0.5 MG tablet Commonly known as: KLONOPIN Take 1 tablet (0.5 mg total) by mouth 2 (two) times daily as needed for anxiety.   empagliflozin 10 MG Tabs  tablet Commonly known as: JARDIANCE Take 1 tablet (10 mg total) by mouth daily.   ezetimibe 10 MG tablet Commonly known as: ZETIA Take 10 mg by mouth every morning.   Fish Oil 1000 MG Caps Take 1,000 mg by mouth every evening.   furosemide 20 MG tablet Commonly known as: LASIX Take 1 tablet (20 mg total) by mouth daily.   hydroxychloroquine 200 MG tablet Commonly known as: PLAQUENIL Take 200 mg by mouth 2 (two) times daily.   metFORMIN 500 MG 24 hr tablet Commonly known as: GLUCOPHAGE-XR Take 500 mg by mouth in the morning and at bedtime.   metoprolol succinate 100 MG 24 hr tablet Commonly known as: TOPROL-XL Take 1 tablet (100 mg total) by mouth daily. Take with or immediately following a meal.   potassium chloride SA 20 MEQ tablet Commonly known as: KLOR-CON M Take 1 tablet (20 mEq total) by mouth daily.   sacubitril-valsartan 49-51 MG Commonly known as: ENTRESTO Take 1 tablet by mouth 2 (two) times daily.   sertraline 25 MG tablet Commonly known as: ZOLOFT Take 1 tablet (25 mg total) by mouth daily. Start taking on: June 08, 2021   simvastatin 40 MG tablet Commonly known as: ZOCOR Take 1 tablet (40 mg total) by mouth daily at 6 PM.   vitamin B-12 1000 MCG tablet Commonly known as: CYANOCOBALAMIN Take 1,000 mcg  by mouth daily.   vitamin C 500 MG tablet Commonly known as: ASCORBIC ACID Take 500 mg by mouth daily.   Vitamin D3 1.25 MG (50000 UT) Caps Take 1 capsule by mouth daily.   zinc gluconate 50 MG tablet Take 50 mg by mouth daily.        Follow-up Information     Alethia Berthold, PA-C Follow up on 06/14/2021.   Specialty: Cardiology Why: 9:15 AM Contact information: [place] [id number] [phone]                 Discharge Exam: Danley Danker Weights   06/05/21 1700 06/06/21 0415 06/07/21 [id number]  Weight: 61.8 kg 61.2 kg 61.1 kg   Neurology patient is awake and alert ENT no pallor Cardiovascular Herat  with S1 and S2 present and rhythmic, no gallops or murmurs No JVD No lower extremity edema Respiratory, lungs clear to auscultation with no wheezing Abdomen soft and non tender  Condition at discharge: stable  The results of significant diagnostics from this hospitalization (including imaging, microbiology, ancillary and laboratory) are listed below for reference.   Imaging Studies: DG Chest 2 View  Result Date: 05/21/2021 CLINICAL DATA:  Chest pain EXAM: CHEST - 2 VIEW COMPARISON:  February 2017 FINDINGS: The heart size and mediastinal contours are within normal limits. Both lungs are clear. No pleural effusion or pneumothorax. No acute osseous abnormality. IMPRESSION: No acute process in the chest. Electronically Signed   By: Macy Mis M.D.   On: 05/21/2021 09:01   CARDIAC CATHETERIZATION  Result Date: 05/21/2021   Prox LAD to Mid LAD lesion is 50% stenosed.   Mid Cx lesion is 30% stenosed.   Prox RCA to Mid RCA lesion is 40% stenosed.   1st Mrg lesion is 30% stenosed.   There is mild left ventricular systolic dysfunction.   LV end diastolic pressure is mildly elevated.   The left ventricular ejection fraction is 45-50% by visual estimate.   There is no mitral valve regurgitation. Left Heart Catheterization 05/21/21: LV: 137/5, EDP 15 mmHg.  Initially EDP was 22 mmHg.  Ao: 140/60, mean 90 mmHg.  There was no pressure gradient across aortic valve. LVEF 50%, apical akinesis. LM: Large vessel, mildly calcified. CX: Large vessel.  Gives origin to a small to moderate-sized OM1 which has ostial 30% stenosis.  Circumflex at the origin of the OM1 has a focal 30 to 40% stenosis. LAD: Mid LAD has moderate luminal irregularity constituting a 50% mildly calcific stenosis.  Gives origin to a very large D1 which traverses all the way to the apex.  LAD ends at the apex. RCA: Dominant.  Mid right has a 30 to 40% stenosis. 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  We will obtain echocardiogram to confirm this.  Medical management for CAD as there is no culprit vessel.   DG Chest Port 1 View  Result Date: 06/05/2021 CLINICAL DATA:  Shortness of breath EXAM: PORTABLE CHEST 1 VIEW COMPARISON:  06/22/2015 FINDINGS: Confluent bilateral airspace disease. Stable heart size and mediastinal contours. No visible effusion or air leak. IMPRESSION: Confluent airspace disease, symmetric and likely CHF. Electronically Signed   By: Jorje Guild M.D.   On: 06/05/2021 04:38   ECHOCARDIOGRAM COMPLETE  Result Date: 05/22/2021    ECHOCARDIOGRAM REPORT   Patient Name:   [name] Date of Exam: 05/21/2021 Medical Rec #:  [id number]  Height:       63.0 in Accession #:    [id number]      Weight:       139.0 lb Date of Birth:  08-13-1945       BSA:          1.657 m Patient Age:    54 years        BP:           129/56 mmHg Patient Gender: F               HR:           69 bpm. Exam Location:  Inpatient Procedure: 2D Echo, Cardiac Doppler, Color Doppler and Strain Analysis Indications:     CAD Native Vessel I25.10  History:         Patient has prior history of Echocardiogram examinations, most                  recent 06/22/2015. Risk Factors:Hypertension, Dyslipidemia and                  Diabetes.  Sonographer:     Darlina Sicilian RDCS Referring Phys:  [place] Diagnosing Phys: Adrian Prows MD IMPRESSIONS  1. Left ventricular ejection fraction, by estimation, is 35 to 40%. The left ventricle has moderately decreased function. The left ventricle demonstrates regional wall motion abnormalities (see scoring diagram/findings for description). Left ventricular  diastolic parameters are consistent with Grade II diastolic dysfunction (pseudonormalization). Elevated left ventricular end-diastolic pressure. There is severe hypokinesis of the left ventricular, mid-apical septal wall, anterior wall and apical segment. There is severe  hypokinesis of the left ventricular, apical inferior segment and apical segment.  2. Right ventricular systolic function is normal. The right ventricular size is normal. There is normal pulmonary artery systolic pressure. The estimated right ventricular systolic pressure is 31.4 mmHg.  3. Left atrial size was mildly dilated.  4. The mitral valve is normal in structure. Mild mitral valve regurgitation.  5. The aortic valve is normal in structure. Aortic valve regurgitation is not visualized. No aortic stenosis is present. FINDINGS  Left Ventricle: Left ventricular ejection fraction, by estimation, is 35 to 40%. The left ventricle has moderately decreased function. The left ventricle demonstrates regional wall motion abnormalities. Severe hypokinesis of the left ventricular, mid-apical septal wall, anterior wall and apical segment. Severe hypokinesis of the left ventricular, apical inferior segment and apical segment. The left ventricular internal cavity size was normal in size. There is no left ventricular hypertrophy. Left  ventricular diastolic parameters are consistent with Grade II diastolic dysfunction (pseudonormalization). Elevated left ventricular end-diastolic pressure. Right Ventricle: The right ventricular size is normal. No increase in right ventricular wall thickness. Right ventricular systolic function is normal. There is normal pulmonary artery systolic pressure. The tricuspid regurgitant velocity is 2.80 m/s, and  with an assumed right atrial pressure of 3 mmHg, the estimated right ventricular systolic pressure is 38.8 mmHg. Left Atrium: Left atrial size was mildly dilated. Right Atrium: Right atrial size was normal in size. Pericardium: There is no evidence of pericardial effusion. Mitral Valve: The mitral valve is normal in structure. Mild mitral valve regurgitation. Tricuspid Valve: The tricuspid valve is normal in structure. Tricuspid valve regurgitation is mild. Aortic Valve: The aortic valve is  normal in structure. Aortic valve regurgitation is not visualized. No aortic stenosis is present. Pulmonic Valve: The pulmonic valve was normal in structure. Pulmonic valve regurgitation is not visualized. No evidence of pulmonic  stenosis. Aorta: The aortic root is normal in size and structure. IAS/Shunts: No atrial level shunt detected by color flow Doppler.  LEFT VENTRICLE PLAX 2D LVIDd:         4.50 cm      Diastology LVIDs:         2.60 cm      LV e' medial:    4.03 cm/s LV PW:         0.90 cm      LV E/e' medial:  19.8 LV IVS:        1.00 cm      LV e' lateral:   5.87 cm/s LVOT diam:     1.60 cm      LV E/e' lateral: 13.6 LV SV:         40 LV SV Index:   24 LVOT Area:     2.01 cm  LV Volumes (MOD) LV vol d, MOD A2C: 114.0 ml LV vol d, MOD A4C: 97.7 ml LV vol s, MOD A2C: 56.4 ml LV vol s, MOD A4C: 65.3 ml LV SV MOD A2C:     57.6 ml LV SV MOD A4C:     97.7 ml LV SV MOD BP:      44.5 ml RIGHT VENTRICLE RV S prime:     14.40 cm/s TAPSE (M-mode): 1.6 cm LEFT ATRIUM             Index        RIGHT ATRIUM           Index LA diam:        2.80 cm 1.69 cm/m   RA Area:     10.60 cm LA Vol (A2C):   23.2 ml 14.00 ml/m  RA Volume:   20.50 ml  12.37 ml/m LA Vol (A4C):   38.3 ml 23.12 ml/m LA Biplane Vol: 30.0 ml 18.11 ml/m  AORTIC VALVE LVOT Vmax:   104.00 cm/s LVOT Vmean:  66.500 cm/s LVOT VTI:    0.201 m  AORTA Ao Root diam: 3.10 cm Ao Asc diam:  3.30 cm MITRAL VALVE                TRICUSPID VALVE MV Area (PHT): 2.62 cm     TR Peak grad:   31.4 mmHg MV Decel Time: 290 msec     TR Vmax:        280.00 cm/s MV E velocity: 79.80 cm/s MV A velocity: 104.00 cm/s  SHUNTS MV E/A ratio:  0.77         Systemic VTI:  0.20 m                             Systemic Diam: 1.60 cm Adrian Prows MD Electronically signed by Adrian Prows MD Signature Date/Time: 05/22/2021/9:25:20 AM    Final    VAS US RENAL ARTERY DUPLEX  Result Date: 06/06/2021 ABDOMINAL VISCERAL Patient Name:  [name]  Date of Exam:   06/06/2021 Medical Rec #:  [id number]        Accession #:    [id number] Date of Birth: March 21, 1946        Patient Gender: F Patient Age:   62 years Exam Location:  [facility] Procedure:      VAS US RENAL ARTERY DUPLEX Referring Phys: [id number] JENNIFER YATES -------------------------------------------------------------------------------- Indications: HTN High Risk Factors: Hypertension, hyperlipidemia, Diabetes, past history of  smoking, prior MI, coronary artery disease, prior CVA. Other Factors: CHF, TIA. Comparison Study: No previous exams Performing Technologist: Jody Hill RVT, RDMS  Examination Guidelines: A complete evaluation includes B-mode imaging, spectral Doppler, color Doppler, and power Doppler as needed of all accessible portions of each vessel. Bilateral testing is considered an integral part of a complete examination. Limited examinations for reoccurring indications may be performed as noted.  Duplex Findings: +--------------------+--------+--------+--------+--------+  Mesenteric           PSV cm/s EDV cm/s  Plaque  Comments  +--------------------+--------+--------+--------+--------+  Aorta Prox              83       11                       +--------------------+--------+--------+--------+--------+  Aorta Mid              152       0     calcific           +--------------------+--------+--------+--------+--------+  Aorta Distal           108       0     calcific           +--------------------+--------+--------+--------+--------+  Celiac Artery Origin   147                                +--------------------+--------+--------+--------+--------+  SMA Proximal           179       22                       +--------------------+--------+--------+--------+--------+    +------------------+--------+--------+-------+  Right Renal Artery PSV cm/s EDV cm/s Comment  +------------------+--------+--------+-------+  Origin                85       18             +------------------+--------+--------+-------+  Proximal               83       17             +------------------+--------+--------+-------+  Mid                  113       25             +------------------+--------+--------+-------+  Distal               122       23             +------------------+--------+--------+-------+ +-----------------+--------+--------+-------+  Left Renal Artery PSV cm/s EDV cm/s Comment  +-----------------+--------+--------+-------+  Origin              195       32             +-----------------+--------+--------+-------+  Proximal            103       16             +-----------------+--------+--------+-------+  Mid                 106       23             +-----------------+--------+--------+-------+  Distal  133       33             +-----------------+--------+--------+-------+ +------------+--------+--------+----+-----------+--------+--------+----+  Right Kidney PSV cm/s EDV cm/s RI   Left Kidney PSV cm/s EDV cm/s RI    +------------+--------+--------+----+-----------+--------+--------+----+  Upper Pole   18       4        0.76 Upper Pole  23       5        0.78  +------------+--------+--------+----+-----------+--------+--------+----+  Mid          20       5        0.75 Mid         28       9        0.68  +------------+--------+--------+----+-----------+--------+--------+----+  Lower Pole   13       5        0.64 Lower Pole  19       5        0.75  +------------+--------+--------+----+-----------+--------+--------+----+  Hilar        36       9        0.76 Hilar       36       10       0.71  +------------+--------+--------+----+-----------+--------+--------+----+ +------------------+-----+------------------+-----+  Right Kidney             Left Kidney               +------------------+-----+------------------+-----+  RAR                      RAR                       +------------------+-----+------------------+-----+  RAR (manual)       0.80  RAR (manual)       1.28   +------------------+-----+------------------+-----+  Cortex              0.74  Cortex             0.54   +------------------+-----+------------------+-----+  Cortex thickness         Corex thickness           +------------------+-----+------------------+-----+  Kidney length (cm) 10.09 Kidney length (cm) 10.04  +------------------+-----+------------------+-----+  Summary: Renal:  Right: Abnormal right Resistive Index. No evidence of right renal        artery stenosis. RRV flow present. Normal size right kidney. Left:  1-59% stenosis of the left renal artery. Abnormal left        Resisitve Index. LRV flow present. Cyst(s) noted. Normal size        of left kidney. Mesenteric: Normal Celiac artery and Superior Mesenteric artery findings. 1-59% stenosis seen in mid-distal portion of aorta (heavily calcified).  *See table(s) above for measurements and observations.  Diagnosing physician: Harold Barban MD  Electronically signed by Harold Barban MD on 06/06/2021 at 8:51:14 PM.    Final    ECHOCARDIOGRAM LIMITED  Result Date: 06/05/2021    ECHOCARDIOGRAM REPORT   Patient Name:   [name] Date of Exam: 06/05/2021 Medical Rec #:  [id number]       Height:       63.0 in Accession #:    [id number]      Weight:       142.8 lb Date of Birth:  [birth year]       BSA:  1.676 m Patient Age:    54 years        BP:           111/64 mmHg Patient Gender: F               HR:           79 bpm. Exam Location:  Inpatient Procedure: 2D Echo, Limited Echo, Cardiac Doppler, Color Doppler and            Intracardiac Opacification Agent Indications:    CHF  History:        Patient has prior history of Echocardiogram examinations. CAD,                 TIA; Risk Factors:Hypertension and Diabetes.  Sonographer:    Jyl Heinz Referring Phys: [place]  1. No left ventricular thrombus is seen (Definity contrast given). Left ventricular ejection fraction, by estimation, is 35 to 40%. The left ventricle has moderately decreased function. The left ventricle demonstrates regional  wall motion abnormalities (see scoring diagram/findings for description). There is mild left ventricular hypertrophy of the basal-septal segment. Left ventricular diastolic parameters are consistent with Grade I diastolic dysfunction (impaired relaxation). Elevated left atrial pressure. There is moderate hypokinesis of the left ventricular, mid anteroseptal wall. There is moderate hypokinesis of the left ventricular, basal-mid inferior wall and inferoseptal wall.  2. Right ventricular systolic function is normal. The right ventricular size is normal. There is normal pulmonary artery systolic pressure. The estimated right ventricular systolic pressure is 29.9 mmHg.  3. The mitral valve is normal in structure. No evidence of mitral valve regurgitation. No evidence of mitral stenosis.  4. The aortic valve is tricuspid. Aortic valve regurgitation is not visualized. Aortic valve sclerosis is present, with no evidence of aortic valve stenosis.  5. The inferior vena cava is normal in size with greater than 50% respiratory variability, suggesting right atrial pressure of 3 mmHg. Comparison(s): A prior study was performed on 05/21/2021. No significant change from prior study. Prior images reviewed side by side. The wall motion distribution is unusual and does not match typical coronary distribution. Takotsubo cardiomyopathy may cause a similar mid cavity pattern of hypokinesis, although this would be expected to improve in most situations after 2 weeks. FINDINGS  Left Ventricle: No left ventricular thrombus is seen (Definity contrast given). Left ventricular ejection fraction, by estimation, is 35 to 40%. The left ventricle has moderately decreased function. The left ventricle demonstrates regional wall motion abnormalities. Moderate hypokinesis of the left ventricular, mid anteroseptal wall. Moderate hypokinesis of the left ventricular, basal-mid inferior wall and inferoseptal wall. The left ventricular internal cavity size  was normal in size. There is mild left ventricular hypertrophy of the basal-septal segment. Left ventricular diastolic parameters are consistent with Grade I diastolic dysfunction (impaired relaxation). Elevated left atrial pressure. Right Ventricle: The right ventricular size is normal. No increase in right ventricular wall thickness. Right ventricular systolic function is normal. There is normal pulmonary artery systolic pressure. The tricuspid regurgitant velocity is 2.58 m/s, and  with an assumed right atrial pressure of 3 mmHg, the estimated right ventricular systolic pressure is 37.1 mmHg. Left Atrium: Left atrial size was normal in size. Right Atrium: Right atrial size was normal in size. Pericardium: There is no evidence of pericardial effusion. Mitral Valve: The mitral valve is normal in structure. No evidence of mitral valve regurgitation. No evidence of mitral valve stenosis. Tricuspid Valve: The tricuspid valve is normal in structure.  Tricuspid valve regurgitation is trivial. No evidence of tricuspid stenosis. Aortic Valve: The aortic valve is tricuspid. Aortic valve regurgitation is not visualized. Aortic valve sclerosis is present, with no evidence of aortic valve stenosis. Aortic valve peak gradient measures 7.0 mmHg. Pulmonic Valve: The pulmonic valve was normal in structure. Pulmonic valve regurgitation is not visualized. No evidence of pulmonic stenosis. Aorta: The aortic root is normal in size and structure. Venous: The inferior vena cava is normal in size with greater than 50% respiratory variability, suggesting right atrial pressure of 3 mmHg. IAS/Shunts: No atrial level shunt detected by color flow Doppler.  LEFT VENTRICLE PLAX 2D LVIDd:         3.70 cm      Diastology LVIDs:         2.70 cm      LV e' medial:    2.03 cm/s LV PW:         1.10 cm      LV E/e' medial:  32.6 LV IVS:        1.40 cm      LV e' lateral:   3.15 cm/s LVOT diam:     2.00 cm      LV E/e' lateral: 21.0 LV SV:         58  LV SV Index:   35 LVOT Area:     3.14 cm  LV Volumes (MOD) LV vol d, MOD A2C: 103.0 ml LV vol d, MOD A4C: 97.0 ml LV vol s, MOD A2C: 61.2 ml LV vol s, MOD A4C: 61.0 ml LV SV MOD A2C:     41.8 ml LV SV MOD A4C:     97.0 ml LV SV MOD BP:      40.4 ml RIGHT VENTRICLE             IVC RV S prime:     10.90 cm/s  IVC diam: 1.80 cm TAPSE (M-mode): 1.6 cm LEFT ATRIUM         Index LA diam:    2.00 cm 1.19 cm/m  AORTIC VALVE AV Area (Vmax): 2.35 cm AV Vmax:        132.00 cm/s AV Peak Grad:   7.0 mmHg LVOT Vmax:      98.80 cm/s LVOT Vmean:     71.600 cm/s LVOT VTI:       0.186 m  AORTA Ao Root diam: 3.30 cm Ao Asc diam:  3.10 cm MITRAL VALVE               TRICUSPID VALVE MV Area (PHT): 3.17 cm    TR Peak grad:   26.6 mmHg MV Decel Time: 239 msec    TR Vmax:        258.00 cm/s MV E velocity: 66.10 cm/s MV A velocity: 86.90 cm/s  SHUNTS MV E/A ratio:  0.76        Systemic VTI:  0.19 m                            Systemic Diam: 2.00 cm Dani Gobble Croitoru MD Electronically signed by Sanda Klein MD Signature Date/Time: 06/05/2021/2:56:19 PM    Final     Microbiology: Results for orders placed or performed during the hospital encounter of 06/05/21  Resp Panel by RT-PCR (Flu A&B, Covid) Nasopharyngeal Swab     Status: None   Collection Time: 06/05/21  3:56 AM   Specimen: Nasopharyngeal Swab; Nasopharyngeal(NP) swabs in vial transport  medium  Result Value Ref Range Status   SARS Coronavirus 2 by RT PCR NEGATIVE NEGATIVE Final    Comment: (NOTE) SARS-CoV-2 target nucleic acids are NOT DETECTED.  The SARS-CoV-2 RNA is generally detectable in upper respiratory specimens during the acute phase of infection. The lowest concentration of SARS-CoV-2 viral copies this assay can detect is 138 copies/mL. A negative result does not preclude SARS-Cov-2 infection and should not be used as the sole basis for treatment or other patient management decisions. A negative result may occur with  improper specimen collection/handling,  submission of specimen other than nasopharyngeal swab, presence of viral mutation(s) within the areas targeted by this assay, and inadequate number of viral copies(<138 copies/mL). A negative result must be combined with clinical observations, patient history, and epidemiological information. The expected result is Negative.  Fact Sheet for Patients:  EntrepreneurPulse.com.au  Fact Sheet for Healthcare Providers:  IncredibleEmployment.be  This test is no t yet approved or cleared by the [place] FDA and  has been authorized for detection and/or diagnosis of SARS-CoV-2 by FDA under an Emergency Use Authorization (EUA). This EUA will remain  in effect (meaning this test can be used) for the duration of the COVID-19 declaration under Section 564(b)(1) of the Act, 21 U.S.C.section 360bbb-3(b)(1), unless the authorization is terminated  or revoked sooner.       Influenza A by PCR NEGATIVE NEGATIVE Final   Influenza B by PCR NEGATIVE NEGATIVE Final    Comment: (NOTE) The Xpert Xpress SARS-CoV-2/FLU/RSV plus assay is intended as an aid in the diagnosis of influenza from Nasopharyngeal swab specimens and should not be used as a sole basis for treatment. Nasal washings and aspirates are unacceptable for Xpert Xpress SARS-CoV-2/FLU/RSV testing.  Fact Sheet for Patients: EntrepreneurPulse.com.au  Fact Sheet for Healthcare Providers: IncredibleEmployment.be  This test is not yet approved or cleared by the [place] FDA and has been authorized for detection and/or diagnosis of SARS-CoV-2 by FDA under an Emergency Use Authorization (EUA). This EUA will remain in effect (meaning this test can be used) for the duration of the COVID-19 declaration under Section 564(b)(1) of the Act, 21 U.S.C. section 360bbb-3(b)(1), unless the authorization is terminated or revoked.  Performed at [facility], [place] [address] [place], [place] [address]     Labs: CBC: Recent Labs  Lab 06/05/21 0355 06/05/21 0434 06/06/21 0436  WBC 13.5*  --  7.1  NEUTROABS 6.6  --   --   HGB 17.6* 16.0* 14.5  HCT 54.0* 47.0* 43.4  MCV 88.4  --  85.9  PLT 373  --  619   Basic Metabolic Panel: Recent Labs  Lab 06/05/21 0355 06/05/21 0434 06/06/21 0436 06/07/21 0139  NA 139 137 140 140  K 3.7 3.2* 3.5 3.6  CL 103  --  100 103  CO2 22  --  29 28  GLUCOSE 217*  --  156* 106*  BUN 16  --  22 27*  CREATININE 0.89  --  1.14* 1.07*  CALCIUM 9.4  --  8.9 8.9  MG  --   --   --  1.9   Liver Function Tests: No results for input(s): AST, ALT, ALKPHOS, BILITOT, PROT, ALBUMIN in the last 168 hours. CBG: Recent Labs  Lab 06/06/21 0624 06/06/21 1111 06/06/21 1634 06/06/21 2123 06/07/21 0612  GLUCAP 131* 99 144* 85 139*    Discharge time spent: greater than 30 minutes.  Signed: Tawni Millers, MD [facility] 06/07/2021

## 2021-06-07 NOTE — TOC Transition Note (Signed)
Transition of Care [facility]) - CM/SW Discharge Note   Patient Details  Name: [name] MRN: [id number] Date of Birth: 1945/12/01  Transition of Care [facility]) CM/SW Contact:  Zenon Mayo, RN Phone Number: 06/07/2021, 10:25 AM   Clinical Narrative:    Patient for dc ,has no needs.   Final next level of care: Home/Self Care Barriers to Discharge: No Barriers Identified   Patient Goals and CMS Choice     Choice offered to / list presented to : NA  Discharge Placement                       Discharge Plan and Services                  DME Agency: NA       HH Arranged: NA          Social Determinants of Health (SDOH) Interventions     Readmission Risk Interventions No flowsheet data found.

## 2021-06-07 NOTE — Progress Notes (Signed)
Mobility Specialist Progress Note:   06/07/21 1020  Mobility  Activity Ambulated independently in hallway  Level of Assistance Independent  Assistive Device None  Distance Ambulated (ft) 470 ft  Activity Response Tolerated well  $Mobility charge 1 Mobility   Pt asx during ambulation. Eager for d/c, pt back in bed with daughter in room.   Nelta Numbers Mobility Specialist  Phone [phone]

## 2021-06-12 DIAGNOSIS — N183 Chronic kidney disease, stage 3 unspecified: Secondary | ICD-10-CM | POA: Diagnosis not present

## 2021-06-12 DIAGNOSIS — E1121 Type 2 diabetes mellitus with diabetic nephropathy: Secondary | ICD-10-CM | POA: Diagnosis not present

## 2021-06-12 DIAGNOSIS — E785 Hyperlipidemia, unspecified: Secondary | ICD-10-CM | POA: Diagnosis not present

## 2021-06-12 DIAGNOSIS — I129 Hypertensive chronic kidney disease with stage 1 through stage 4 chronic kidney disease, or unspecified chronic kidney disease: Secondary | ICD-10-CM | POA: Diagnosis not present
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
## 2021-06-14 ENCOUNTER — Ambulatory Visit: Payer: Medicare Other | Admitting: Student

## 2021-06-14 ENCOUNTER — Encounter: Payer: Self-pay | Admitting: Student

## 2021-06-14 ENCOUNTER — Other Ambulatory Visit: Payer: Self-pay

## 2021-06-14 VITALS — BP 130/75 | HR 66 | Temp 98.2°F | Resp 16 | Ht 63.0 in | Wt 136.0 lb

## 2021-06-14 DIAGNOSIS — I5181 Takotsubo syndrome: Secondary | ICD-10-CM

## 2021-06-14 DIAGNOSIS — I2510 Atherosclerotic heart disease of native coronary artery without angina pectoris: Secondary | ICD-10-CM

## 2021-06-18 DIAGNOSIS — Z79899 Other long term (current) drug therapy: Secondary | ICD-10-CM | POA: Diagnosis not present

## 2021-06-18 DIAGNOSIS — H52203 Unspecified astigmatism, bilateral: Secondary | ICD-10-CM | POA: Diagnosis not present

## 2021-06-18 DIAGNOSIS — H26491 Other secondary cataract, right eye: Secondary | ICD-10-CM | POA: Diagnosis not present

## 2021-06-18 DIAGNOSIS — E119 Type 2 diabetes mellitus without complications: Secondary | ICD-10-CM | POA: Diagnosis not present

## 2021-06-19 DIAGNOSIS — I5181 Takotsubo syndrome: Secondary | ICD-10-CM | POA: Diagnosis not present

## 2021-06-19 DIAGNOSIS — L299 Pruritus, unspecified: Secondary | ICD-10-CM | POA: Diagnosis not present

## 2021-06-19 DIAGNOSIS — M059 Rheumatoid arthritis with rheumatoid factor, unspecified: Secondary | ICD-10-CM | POA: Diagnosis not present

## 2021-06-19 DIAGNOSIS — I5043 Acute on chronic combined systolic (congestive) and diastolic (congestive) heart failure: Secondary | ICD-10-CM | POA: Diagnosis not present

## 2021-06-19 DIAGNOSIS — E1121 Type 2 diabetes mellitus with diabetic nephropathy: Secondary | ICD-10-CM | POA: Diagnosis not present

## 2021-06-19 DIAGNOSIS — Z7984 Long term (current) use of oral hypoglycemic drugs: Secondary | ICD-10-CM | POA: Diagnosis not present

## 2021-06-29 ENCOUNTER — Telehealth (HOSPITAL_COMMUNITY): Payer: Self-pay

## 2021-06-29 ENCOUNTER — Encounter (HOSPITAL_COMMUNITY): Payer: Self-pay

## 2021-06-29 NOTE — Telephone Encounter (Signed)
Attempted to call patient in regards to Cardiac Rehab - LM on VM Mailed letter 

## 2021-06-29 NOTE — Telephone Encounter (Signed)
Pt returned CR phone call and stated she is interested in CR. Patient will come in for orientation on 07/17/21 @ 830AM and will attend the 830AM exercise class. ?  ?Tourist information centre manager.  ?

## 2021-07-03 ENCOUNTER — Other Ambulatory Visit (HOSPITAL_COMMUNITY): Payer: Self-pay

## 2021-07-04 ENCOUNTER — Other Ambulatory Visit: Payer: Self-pay

## 2021-07-04 ENCOUNTER — Other Ambulatory Visit: Payer: Self-pay | Admitting: Cardiology

## 2021-07-04 ENCOUNTER — Telehealth: Payer: Self-pay | Admitting: Cardiology

## 2021-07-04 DIAGNOSIS — M1991 Primary osteoarthritis, unspecified site: Secondary | ICD-10-CM | POA: Diagnosis not present

## 2021-07-04 DIAGNOSIS — I5022 Chronic systolic (congestive) heart failure: Secondary | ICD-10-CM

## 2021-07-04 DIAGNOSIS — M353 Polymyalgia rheumatica: Secondary | ICD-10-CM | POA: Diagnosis not present

## 2021-07-04 DIAGNOSIS — Z79899 Other long term (current) drug therapy: Secondary | ICD-10-CM | POA: Diagnosis not present

## 2021-07-04 DIAGNOSIS — I5181 Takotsubo syndrome: Secondary | ICD-10-CM

## 2021-07-04 DIAGNOSIS — M0579 Rheumatoid arthritis with rheumatoid factor of multiple sites without organ or systems involvement: Secondary | ICD-10-CM | POA: Diagnosis not present

## 2021-07-04 MED ORDER — SACUBITRIL-VALSARTAN 49-51 MG PO TABS: 1.0000 | ORAL_TABLET | Freq: Two times a day (BID) | ORAL | 2 refills | Status: DC

## 2021-07-04 NOTE — Telephone Encounter (Signed)
I sent the Rx to [facility]

## 2021-07-04 NOTE — Progress Notes (Signed)
ICD-10-CM   ?1. Takotsubo cardiomyopathy  I51.81   ?  ?2. Chronic systolic heart failure (HCC)  I50.22 sacubitril-valsartan (ENTRESTO) 49-51 MG  ?  ? ?Meds ordered this encounter  ?Medications  ? sacubitril-valsartan (ENTRESTO) 49-51 MG  ?  Sig: Take 1 tablet by mouth 2 (two) times daily.  ?  Dispense:  60 tablet  ?  Refill:  2  ?  ?Medications Discontinued During This Encounter  ?Medication Reason  ? sacubitril-valsartan (ENTRESTO) 49-51 MG Reorder  ?  ?

## 2021-07-04 NOTE — Telephone Encounter (Signed)
Patient recently admitted for a mild heart attack. She says she was told her prescription (provided by [facility]) for entresto 49-51 MG is supposed to also have refills available, but currently there are none. She would like a call back when this is done. ?

## 2021-07-05 NOTE — Telephone Encounter (Signed)
Called and spoke to pt, pt aware.

## 2021-07-13 ENCOUNTER — Telehealth (HOSPITAL_COMMUNITY): Payer: Self-pay

## 2021-07-13 NOTE — Telephone Encounter (Signed)
Successful telephone encounter to patient to confirm cardiac rehab orientation appointment 07/17/21 at 8:30. Health history completed. All questions answered. Confirmed patient has received directions and cardiac rehab contact via mail. Reviewed location and parking instructions.  ?

## 2021-07-17 ENCOUNTER — Encounter (HOSPITAL_COMMUNITY)
Admission: RE | Admit: 2021-07-17 | Discharge: 2021-07-17 | Disposition: A | Discharge status: Home / Self Care | Payer: Medicare Other | Source: Ambulatory Visit | Attending: Cardiology | Admitting: Cardiology

## 2021-07-17 ENCOUNTER — Other Ambulatory Visit: Payer: Self-pay

## 2021-07-17 ENCOUNTER — Encounter (HOSPITAL_COMMUNITY): Payer: Self-pay

## 2021-07-17 VITALS — BP 128/78 | HR 68 | Ht 62.25 in | Wt 135.8 lb

## 2021-07-17 DIAGNOSIS — I214 Non-ST elevation (NSTEMI) myocardial infarction: Secondary | ICD-10-CM | POA: Insufficient documentation

## 2021-07-17 HISTORY — DX: Atherosclerotic heart disease of native coronary artery without angina pectoris: I25.10

## 2021-07-17 NOTE — Progress Notes (Signed)
Cardiac Rehab Medication Review by a Nurse ? ?Does the patient  feel that his/her medications are working for him/her?  YES  ? ?Has the patient been experiencing any side effects to the medications prescribed?   Not Sure ? ?Does the patient measure his/her own blood pressure or blood glucose at home?  YES ? ?Does the patient have any problems obtaining medications due to transportation or finances?    NO ? ?Understanding of regimen: good ?Understanding of indications: good ?Potential of compliance: good ? ? ? ?Nurse  comments: Roselinda is taking her medications as prescribed.Shanyah reports that she is having problems with itching. Sherrika is not sure if the itching is related to any of her medications. Maleiya has discussed this with her primary care provider, Dr Dema Severin. Tressa is checks her blood sugar daily and checks her blood pressure on a regular basis. Greer says she is no longer taking Zoloft as it was prescribed at the hospital. Krisanne does not have any refills.Harrell Gave RN BSN  ? ? ? ?Christa See Brysyn Brandenberger ?07/17/2021 9:24 AM ?  ?
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
## 2021-07-20 DIAGNOSIS — E1121 Type 2 diabetes mellitus with diabetic nephropathy: Secondary | ICD-10-CM | POA: Diagnosis not present

## 2021-07-20 DIAGNOSIS — I129 Hypertensive chronic kidney disease with stage 1 through stage 4 chronic kidney disease, or unspecified chronic kidney disease: Secondary | ICD-10-CM | POA: Diagnosis not present

## 2021-07-20 DIAGNOSIS — I5043 Acute on chronic combined systolic (congestive) and diastolic (congestive) heart failure: Secondary | ICD-10-CM | POA: Diagnosis not present

## 2021-07-23 ENCOUNTER — Encounter (HOSPITAL_COMMUNITY)
Admission: RE | Admit: 2021-07-23 | Discharge: 2021-07-23 | Disposition: A | Discharge status: Home / Self Care | Payer: Medicare Other | Source: Ambulatory Visit | Attending: Cardiology | Admitting: Cardiology

## 2021-07-23 ENCOUNTER — Other Ambulatory Visit: Payer: Self-pay

## 2021-07-23 DIAGNOSIS — I214 Non-ST elevation (NSTEMI) myocardial infarction: Secondary | ICD-10-CM

## 2021-07-23 LAB — GLUCOSE, CAPILLARY
Glucose-Capillary: 162 mg/dL — ABNORMAL HIGH (ref 70–99)
Glucose-Capillary: 87 mg/dL (ref 70–99)

## 2021-07-23 NOTE — Progress Notes (Signed)
Daily Session Note ? ?Patient Details  ?Name: [name] ?MRN: [id number] ?Date of Birth: [birth year] ?Referring Provider:   ?Flowsheet Row CARDIAC REHAB PHASE II ORIENTATION from 07/17/2021 in [facility]  ?Referring Provider Adrian Prows, MD  ? ?  ? ? ?Encounter Date: 07/23/2021 ? ?Check In: ? Session Check In - 07/23/21 0830   ? ?  ? Check-In  ? Supervising physician immediately available to respond to emergencies [facility] immediately available   ? Physician(s) Dr Pietro Cassis   ? Location [facility]   ? Staff Present Barnet Pall, RN, Milus Glazier, MS, ACSM-CEP, CCRP, Exercise Physiologist;Olinty Celesta Aver, MS, ACSM CEP, Exercise Physiologist;Jetta Gilford Rile BS, ACSM EP-C, Exercise Physiologist;Carlette Wilber Oliphant, Therapist, sports, BSN   ? Virtual Visit No   ? Medication changes reported     No   ? Fall or balance concerns reported    No   ? Tobacco Cessation No Change   ? Current number of cigarettes/nicotine per day     0   ? Warm-up and Cool-down Performed as group-led instruction   ? Resistance Training Performed Yes   ? VAD Patient? No   ? PAD/SET Patient? No   ?  ? Pain Assessment  ? Currently in Pain? No/denies   ? Pain Score 0-No pain   ? Multiple Pain Sites No   ? ?  ?  ? ?  ? ? ?Capillary Blood Glucose: ?Results for orders placed or performed during the hospital encounter of 07/17/21 (from the past 24 hour(s))  ?Glucose, capillary     Status: Abnormal  ? Collection Time: 07/23/21  8:23 AM  ?Result Value Ref Range  ? Glucose-Capillary 162 (H) 70 - 99 mg/dL  ? ? ? Exercise Prescription Changes - 07/23/21 0900   ? ?  ? Response to Exercise  ? Blood Pressure (Admit) 122/78   ? Blood Pressure (Exercise) 144/70   ? Blood Pressure (Exit) 122/60   ? Heart Rate (Admit) 63 bpm   ? Heart Rate (Exercise) 87 bpm   ? Heart Rate (Exit) 63 bpm   ? Rating of Perceived Exertion (Exercise) 11   ? Symptoms None   ? Comments Pt's first day in the [place] program.   ? Duration Continue with  30 min of aerobic exercise without signs/symptoms of physical distress.   ? Intensity THRR unchanged   ?  ? Progression  ? Progression Continue to progress workloads to maintain intensity without signs/symptoms of physical distress.   ? Average METs 2.7   ?  ? Resistance Training  ? Training Prescription No   ? Weight Held due to blood sugar   ?  ? Interval Training  ? Interval Training No   ?  ? NuStep  ? Level 2   ? SPM 82   ? Minutes 15   ? METs 2.3   ?  ? Track  ? Laps 18   ? Minutes 15   ? METs 3.09   ? ?  ?  ? ?  ? ? ?Social History  ? ?Tobacco Use  ?Smoking Status Former  ? Packs/day: 1.50  ? Years: 30.00  ? Pack years: 45.00  ? Types: Cigarettes  ? Quit date: 02/29/1992  ? Years since quitting: 29.4  ?Smokeless Tobacco Never  ? ? ?Goals Met:  ?Exercise tolerated well ?No report of concerns or symptoms today ? ?Goals Unmet:  ?Not Applicable ? ?Comments: Pt started cardiac rehab today.  Pt tolerated light exercise  without difficulty. VSS, telemetry-Sinus Rhythm T wave inversion, asymptomatic.  Medication list reconciled. Pt denies barriers to medicaiton compliance.  PSYCHOSOCIAL ASSESSMENT:  PHQ-0. Pt exhibits positive coping skills, hopeful outlook with supportive family. No psychosocial needs identified at this time, no psychosocial interventions necessary.    Pt enjoys exercising arranging flowers and travellling.   Pt oriented to exercise equipment and routine.    Understanding verbalized. Pre exercise CBG 162. Post exercise CBG 87.[name] was given [place].  [name] did not use weights today and was asymptomatic. [name] ate 2 hard boiled eggs and two pieces of toast with jelly. Will continue to monitor.Barnet Pall, RN,BSN ?07/23/2021 10:01 AM  ? ? ?Dr. Fransico Him is Medical Director for Cardiac Rehab at [facility] ?

## 2021-07-24 DIAGNOSIS — L298 Other pruritus: Secondary | ICD-10-CM | POA: Diagnosis not present

## 2021-07-25 ENCOUNTER — Other Ambulatory Visit: Payer: Self-pay

## 2021-07-25 ENCOUNTER — Encounter (HOSPITAL_COMMUNITY)
Admission: RE | Admit: 2021-07-25 | Discharge: 2021-07-25 | Disposition: A | Discharge status: Home / Self Care | Payer: Medicare Other | Source: Ambulatory Visit | Attending: Cardiology | Admitting: Cardiology

## 2021-07-25 DIAGNOSIS — I214 Non-ST elevation (NSTEMI) myocardial infarction: Secondary | ICD-10-CM | POA: Diagnosis not present

## 2021-07-25 LAB — GLUCOSE, CAPILLARY
Glucose-Capillary: 143 mg/dL — ABNORMAL HIGH (ref 70–99)
Glucose-Capillary: 107 mg/dL — ABNORMAL HIGH (ref 70–99)

## 2021-07-27 ENCOUNTER — Encounter (HOSPITAL_COMMUNITY): Payer: Medicare Other

## 2021-07-30 ENCOUNTER — Encounter (HOSPITAL_COMMUNITY)
Admission: RE | Admit: 2021-07-30 | Discharge: 2021-07-30 | Disposition: A | Discharge status: Home / Self Care | Payer: Medicare Other | Source: Ambulatory Visit | Attending: Cardiology | Admitting: Cardiology

## 2021-07-30 DIAGNOSIS — I252 Old myocardial infarction: Secondary | ICD-10-CM | POA: Insufficient documentation

## 2021-07-30 DIAGNOSIS — I429 Cardiomyopathy, unspecified: Secondary | ICD-10-CM | POA: Diagnosis not present

## 2021-07-30 DIAGNOSIS — I5022 Chronic systolic (congestive) heart failure: Secondary | ICD-10-CM | POA: Insufficient documentation

## 2021-07-30 DIAGNOSIS — I214 Non-ST elevation (NSTEMI) myocardial infarction: Secondary | ICD-10-CM

## 2021-08-01 ENCOUNTER — Other Ambulatory Visit: Payer: Self-pay

## 2021-08-01 ENCOUNTER — Encounter (HOSPITAL_COMMUNITY)
Admission: RE | Admit: 2021-08-01 | Discharge: 2021-08-01 | Disposition: A | Discharge status: Home / Self Care | Payer: Medicare Other | Source: Ambulatory Visit | Attending: Cardiology | Admitting: Cardiology

## 2021-08-01 DIAGNOSIS — I429 Cardiomyopathy, unspecified: Secondary | ICD-10-CM | POA: Diagnosis not present

## 2021-08-01 DIAGNOSIS — I214 Non-ST elevation (NSTEMI) myocardial infarction: Secondary | ICD-10-CM

## 2021-08-01 DIAGNOSIS — I5022 Chronic systolic (congestive) heart failure: Secondary | ICD-10-CM | POA: Diagnosis not present

## 2021-08-01 DIAGNOSIS — I252 Old myocardial infarction: Secondary | ICD-10-CM | POA: Diagnosis not present

## 2021-08-01 LAB — GLUCOSE, CAPILLARY: Glucose-Capillary: 193 mg/dL — ABNORMAL HIGH (ref 70–99)

## 2021-08-01 MED ORDER — METOPROLOL SUCCINATE ER 100 MG PO TB24: 100.0000 mg | ORAL_TABLET | Freq: Every day | ORAL | 2 refills | Status: DC

## 2021-08-03 ENCOUNTER — Encounter (HOSPITAL_COMMUNITY)
Admission: RE | Admit: 2021-08-03 | Discharge: 2021-08-03 | Disposition: A | Discharge status: Home / Self Care | Payer: Medicare Other | Source: Ambulatory Visit | Attending: Cardiology | Admitting: Cardiology

## 2021-08-03 DIAGNOSIS — I252 Old myocardial infarction: Secondary | ICD-10-CM | POA: Diagnosis not present

## 2021-08-03 DIAGNOSIS — I5022 Chronic systolic (congestive) heart failure: Secondary | ICD-10-CM | POA: Diagnosis not present

## 2021-08-03 DIAGNOSIS — I214 Non-ST elevation (NSTEMI) myocardial infarction: Secondary | ICD-10-CM

## 2021-08-03 DIAGNOSIS — I429 Cardiomyopathy, unspecified: Secondary | ICD-10-CM | POA: Diagnosis not present

## 2021-08-06 ENCOUNTER — Encounter (HOSPITAL_COMMUNITY)
Admission: RE | Admit: 2021-08-06 | Discharge: 2021-08-06 | Disposition: A | Discharge status: Home / Self Care | Payer: Medicare Other | Source: Ambulatory Visit | Attending: Cardiology | Admitting: Cardiology

## 2021-08-06 DIAGNOSIS — I5022 Chronic systolic (congestive) heart failure: Secondary | ICD-10-CM | POA: Diagnosis not present

## 2021-08-06 DIAGNOSIS — I214 Non-ST elevation (NSTEMI) myocardial infarction: Secondary | ICD-10-CM

## 2021-08-06 DIAGNOSIS — I252 Old myocardial infarction: Secondary | ICD-10-CM | POA: Diagnosis not present

## 2021-08-06 DIAGNOSIS — I429 Cardiomyopathy, unspecified: Secondary | ICD-10-CM | POA: Diagnosis not present

## 2021-08-08 ENCOUNTER — Encounter (HOSPITAL_COMMUNITY)
Admission: RE | Admit: 2021-08-08 | Discharge: 2021-08-08 | Disposition: A | Discharge status: Home / Self Care | Payer: Medicare Other | Source: Ambulatory Visit | Attending: Cardiology | Admitting: Cardiology

## 2021-08-08 DIAGNOSIS — I252 Old myocardial infarction: Secondary | ICD-10-CM | POA: Diagnosis not present

## 2021-08-08 DIAGNOSIS — I214 Non-ST elevation (NSTEMI) myocardial infarction: Secondary | ICD-10-CM

## 2021-08-08 DIAGNOSIS — I5022 Chronic systolic (congestive) heart failure: Secondary | ICD-10-CM | POA: Diagnosis not present

## 2021-08-08 DIAGNOSIS — I429 Cardiomyopathy, unspecified: Secondary | ICD-10-CM | POA: Diagnosis not present
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
## 2021-08-10 ENCOUNTER — Encounter (HOSPITAL_COMMUNITY)
Admission: RE | Admit: 2021-08-10 | Discharge: 2021-08-10 | Disposition: A | Discharge status: Home / Self Care | Payer: Medicare Other | Source: Ambulatory Visit | Attending: Cardiology | Admitting: Cardiology

## 2021-08-10 DIAGNOSIS — I429 Cardiomyopathy, unspecified: Secondary | ICD-10-CM | POA: Diagnosis not present

## 2021-08-10 DIAGNOSIS — I252 Old myocardial infarction: Secondary | ICD-10-CM | POA: Diagnosis not present

## 2021-08-10 DIAGNOSIS — I214 Non-ST elevation (NSTEMI) myocardial infarction: Secondary | ICD-10-CM

## 2021-08-10 DIAGNOSIS — I5022 Chronic systolic (congestive) heart failure: Secondary | ICD-10-CM | POA: Diagnosis not present
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
## 2021-08-13 ENCOUNTER — Encounter (HOSPITAL_COMMUNITY)
Admission: RE | Admit: 2021-08-13 | Discharge: 2021-08-13 | Disposition: A | Discharge status: Home / Self Care | Payer: Medicare Other | Source: Ambulatory Visit | Attending: Cardiology | Admitting: Cardiology

## 2021-08-13 DIAGNOSIS — I214 Non-ST elevation (NSTEMI) myocardial infarction: Secondary | ICD-10-CM

## 2021-08-13 DIAGNOSIS — I5022 Chronic systolic (congestive) heart failure: Secondary | ICD-10-CM | POA: Diagnosis not present

## 2021-08-13 DIAGNOSIS — I252 Old myocardial infarction: Secondary | ICD-10-CM | POA: Diagnosis not present

## 2021-08-13 DIAGNOSIS — I429 Cardiomyopathy, unspecified: Secondary | ICD-10-CM | POA: Diagnosis not present

## 2021-08-14 ENCOUNTER — Encounter: Payer: Self-pay | Admitting: Cardiology

## 2021-08-14 ENCOUNTER — Ambulatory Visit: Payer: Medicare Other | Admitting: Cardiology

## 2021-08-14 VITALS — BP 150/70 | HR 60 | Ht 63.0 in | Wt 140.2 lb

## 2021-08-14 DIAGNOSIS — I2510 Atherosclerotic heart disease of native coronary artery without angina pectoris: Secondary | ICD-10-CM

## 2021-08-14 DIAGNOSIS — I5022 Chronic systolic (congestive) heart failure: Secondary | ICD-10-CM | POA: Diagnosis not present

## 2021-08-14 DIAGNOSIS — I5181 Takotsubo syndrome: Secondary | ICD-10-CM | POA: Diagnosis not present

## 2021-08-14 DIAGNOSIS — E7800 Pure hypercholesterolemia, unspecified: Secondary | ICD-10-CM | POA: Diagnosis not present

## 2021-08-14 MED ORDER — SACUBITRIL-VALSARTAN 97-103 MG PO TABS: 1.0000 | ORAL_TABLET | Freq: Two times a day (BID) | ORAL | 11 refills | Status: DC

## 2021-08-14 MED ORDER — SPIRONOLACTONE 25 MG PO TABS: 12.5000 mg | ORAL_TABLET | Freq: Every day | ORAL | 3 refills | Status: DC

## 2021-08-14 NOTE — Patient Instructions (Addendum)
Increase Entresto to 97-103 mg twice a day ? ?Add aldactone 12.5 mg once a day ? ?We will check a blood test in 1 week to check your kidneys and potassium ? ?We will check an Echo in about one month ? ?Follow up appointment with Dr.Jordan in 3 months ?

## 2021-08-15 ENCOUNTER — Encounter (HOSPITAL_COMMUNITY)
Admission: RE | Admit: 2021-08-15 | Discharge: 2021-08-15 | Disposition: A | Discharge status: Home / Self Care | Payer: Medicare Other | Source: Ambulatory Visit | Attending: Cardiology | Admitting: Cardiology

## 2021-08-15 DIAGNOSIS — I214 Non-ST elevation (NSTEMI) myocardial infarction: Secondary | ICD-10-CM

## 2021-08-15 DIAGNOSIS — I252 Old myocardial infarction: Secondary | ICD-10-CM | POA: Diagnosis not present

## 2021-08-15 DIAGNOSIS — I5022 Chronic systolic (congestive) heart failure: Secondary | ICD-10-CM | POA: Diagnosis not present

## 2021-08-15 DIAGNOSIS — I429 Cardiomyopathy, unspecified: Secondary | ICD-10-CM | POA: Diagnosis not present

## 2021-08-15 NOTE — Progress Notes (Signed)
Reviewed home exercise Rx with patient today.  Encouraged warm-up, cool-down, and stretching. Reviewed THRR of 57 - 114 and keeping RPE between 11-13. Encouraged to hydrate with activity.  ?Reviewed weather parameters for temperature and humidity for safe exercise outdoors. Reviewed S/S to terminate exercise and when to call 911 vs MD. Reviewed the use of NTG and pt was encouraged to carry at all times. Pt encouraged to always carry a cell phone for safety when exercising outdoors. Pt encouraged to know blood glucose level before beginning exercise and carry a rapid acting glucose snack. Pt verbalized understanding of the home exercise Rx and was provided a copy.  ? ?Lesly Rubenstein MS, ACSM-CEP, CCRP  ?

## 2021-08-17 ENCOUNTER — Encounter (HOSPITAL_COMMUNITY)
Admission: RE | Admit: 2021-08-17 | Discharge: 2021-08-17 | Disposition: A | Discharge status: Home / Self Care | Payer: Medicare Other | Source: Ambulatory Visit | Attending: Cardiology | Admitting: Cardiology

## 2021-08-17 DIAGNOSIS — I252 Old myocardial infarction: Secondary | ICD-10-CM | POA: Diagnosis not present

## 2021-08-17 DIAGNOSIS — I5022 Chronic systolic (congestive) heart failure: Secondary | ICD-10-CM | POA: Diagnosis not present

## 2021-08-17 DIAGNOSIS — I214 Non-ST elevation (NSTEMI) myocardial infarction: Secondary | ICD-10-CM

## 2021-08-17 DIAGNOSIS — I429 Cardiomyopathy, unspecified: Secondary | ICD-10-CM | POA: Diagnosis not present

## 2021-08-20 ENCOUNTER — Encounter (HOSPITAL_COMMUNITY)
Admission: RE | Admit: 2021-08-20 | Discharge: 2021-08-20 | Disposition: A | Discharge status: Home / Self Care | Payer: Medicare Other | Source: Ambulatory Visit | Attending: Cardiology | Admitting: Cardiology

## 2021-08-20 DIAGNOSIS — I214 Non-ST elevation (NSTEMI) myocardial infarction: Secondary | ICD-10-CM

## 2021-08-20 DIAGNOSIS — I252 Old myocardial infarction: Secondary | ICD-10-CM | POA: Diagnosis not present

## 2021-08-20 DIAGNOSIS — I5022 Chronic systolic (congestive) heart failure: Secondary | ICD-10-CM | POA: Diagnosis not present

## 2021-08-20 DIAGNOSIS — I429 Cardiomyopathy, unspecified: Secondary | ICD-10-CM | POA: Diagnosis not present

## 2021-08-22 ENCOUNTER — Encounter (HOSPITAL_COMMUNITY): Payer: Medicare Other

## 2021-08-22 DIAGNOSIS — I5022 Chronic systolic (congestive) heart failure: Secondary | ICD-10-CM | POA: Diagnosis not present

## 2021-08-22 DIAGNOSIS — I2510 Atherosclerotic heart disease of native coronary artery without angina pectoris: Secondary | ICD-10-CM | POA: Diagnosis not present

## 2021-08-22 DIAGNOSIS — E7800 Pure hypercholesterolemia, unspecified: Secondary | ICD-10-CM | POA: Diagnosis not present

## 2021-08-22 DIAGNOSIS — I5181 Takotsubo syndrome: Secondary | ICD-10-CM | POA: Diagnosis not present

## 2021-08-22 LAB — BASIC METABOLIC PANEL
BUN/Creatinine Ratio: 30 — ABNORMAL HIGH (ref 12–28)
BUN: 28 mg/dL — ABNORMAL HIGH (ref 8–27)
CO2: 23 mmol/L (ref 20–29)
Calcium: 9.7 mg/dL (ref 8.7–10.3)
Chloride: 102 mmol/L (ref 96–106)
Creatinine, Ser: 0.93 mg/dL (ref 0.57–1.00)
Glucose: 122 mg/dL — ABNORMAL HIGH (ref 70–99)
Potassium: 4.3 mmol/L (ref 3.5–5.2)
Sodium: 141 mmol/L (ref 134–144)
eGFR: 63 mL/min/{1.73_m2} (ref >59)

## 2021-08-24 ENCOUNTER — Encounter (HOSPITAL_COMMUNITY)
Admission: RE | Admit: 2021-08-24 | Discharge: 2021-08-24 | Disposition: A | Discharge status: Home / Self Care | Payer: Medicare Other | Source: Ambulatory Visit | Attending: Cardiology | Admitting: Cardiology

## 2021-08-24 DIAGNOSIS — I5022 Chronic systolic (congestive) heart failure: Secondary | ICD-10-CM | POA: Diagnosis not present

## 2021-08-24 DIAGNOSIS — I214 Non-ST elevation (NSTEMI) myocardial infarction: Secondary | ICD-10-CM

## 2021-08-24 DIAGNOSIS — I252 Old myocardial infarction: Secondary | ICD-10-CM | POA: Diagnosis not present

## 2021-08-24 DIAGNOSIS — I429 Cardiomyopathy, unspecified: Secondary | ICD-10-CM | POA: Diagnosis not present

## 2021-08-25 ENCOUNTER — Emergency Department (HOSPITAL_COMMUNITY)
Admission: EM | Admit: 2021-08-25 | Discharge: 2021-08-25 | Disposition: A | Discharge status: Home / Self Care | Payer: Medicare Other | Attending: Emergency Medicine | Admitting: Emergency Medicine

## 2021-08-25 ENCOUNTER — Emergency Department (HOSPITAL_COMMUNITY): Payer: Medicare Other

## 2021-08-25 ENCOUNTER — Other Ambulatory Visit: Payer: Self-pay

## 2021-08-25 ENCOUNTER — Encounter (HOSPITAL_COMMUNITY): Payer: Self-pay | Admitting: Emergency Medicine

## 2021-08-25 DIAGNOSIS — E119 Type 2 diabetes mellitus without complications: Secondary | ICD-10-CM | POA: Diagnosis not present

## 2021-08-25 DIAGNOSIS — Z7982 Long term (current) use of aspirin: Secondary | ICD-10-CM | POA: Diagnosis not present

## 2021-08-25 DIAGNOSIS — Z7984 Long term (current) use of oral hypoglycemic drugs: Secondary | ICD-10-CM | POA: Diagnosis not present

## 2021-08-25 DIAGNOSIS — I10 Essential (primary) hypertension: Secondary | ICD-10-CM | POA: Diagnosis not present

## 2021-08-25 DIAGNOSIS — R072 Precordial pain: Secondary | ICD-10-CM | POA: Diagnosis not present

## 2021-08-25 DIAGNOSIS — Z79899 Other long term (current) drug therapy: Secondary | ICD-10-CM | POA: Diagnosis not present

## 2021-08-25 DIAGNOSIS — R0789 Other chest pain: Secondary | ICD-10-CM | POA: Diagnosis not present

## 2021-08-25 DIAGNOSIS — R079 Chest pain, unspecified: Secondary | ICD-10-CM

## 2021-08-25 DIAGNOSIS — Z951 Presence of aortocoronary bypass graft: Secondary | ICD-10-CM | POA: Diagnosis not present

## 2021-08-25 DIAGNOSIS — Z87891 Personal history of nicotine dependence: Secondary | ICD-10-CM | POA: Insufficient documentation

## 2021-08-25 DIAGNOSIS — I2510 Atherosclerotic heart disease of native coronary artery without angina pectoris: Secondary | ICD-10-CM | POA: Diagnosis not present

## 2021-08-25 LAB — BASIC METABOLIC PANEL
Anion gap: 8 (ref 5–15)
BUN: 26 mg/dL — ABNORMAL HIGH (ref 8–23)
CO2: 29 mmol/L (ref 22–32)
Calcium: 10.1 mg/dL (ref 8.9–10.3)
Chloride: 101 mmol/L (ref 98–111)
Creatinine, Ser: 0.97 mg/dL (ref 0.44–1.00)
GFR, Estimated: >60 mL/min (ref >60)
Glucose, Bld: 140 mg/dL — ABNORMAL HIGH (ref 70–99)
Potassium: 4.1 mmol/L (ref 3.5–5.1)
Sodium: 138 mmol/L (ref 135–145)

## 2021-08-25 LAB — CBC
HCT: 50 % — ABNORMAL HIGH (ref 36.0–46.0)
Hemoglobin: 16.2 g/dL — ABNORMAL HIGH (ref 12.0–15.0)
MCH: 28.2 pg (ref 26.0–34.0)
MCHC: 32.4 g/dL (ref 30.0–36.0)
MCV: 87.1 fL (ref 80.0–100.0)
Platelets: 253 10*3/uL (ref 150–400)
RBC: 5.74 MIL/uL — ABNORMAL HIGH (ref 3.87–5.11)
RDW: 13.2 % (ref 11.5–15.5)
WBC: 6.8 10*3/uL (ref 4.0–10.5)
nRBC: 0 % (ref 0.0–0.2)

## 2021-08-25 LAB — TROPONIN I (HIGH SENSITIVITY)
Troponin I (High Sensitivity): 6 ng/L (ref <18)
Troponin I (High Sensitivity): 5 ng/L (ref <18)

## 2021-08-25 MED ORDER — SODIUM CHLORIDE 0.9 % IV BOLUS: 1000.0000 mL | Freq: Once | INTRAVENOUS | Status: DC

## 2021-08-25 MED ORDER — ASPIRIN 325 MG PO TABS
325.0000 mg | ORAL_TABLET | Freq: Once | ORAL | Status: AC
  Filled 2021-08-25: qty 1

## 2021-08-25 MED ORDER — SODIUM CHLORIDE 0.9 % IV BOLUS
500.0000 mL | Freq: Once | INTRAVENOUS | Status: AC
  Administered 2021-08-25: 500 mL via INTRAVENOUS

## 2021-08-25 MED ORDER — ASPIRIN 325 MG PO TABS
ORAL_TABLET | ORAL | Status: AC
  Administered 2021-08-25: 325 mg via ORAL
  Filled 2021-08-25: qty 1

## 2021-08-25 NOTE — Discharge Instructions (Addendum)
Return to the Emergency Department if you have unusual chest pain, pressure, or discomfort, shortness of breath, nausea, vomiting, burping, heartburn, tingling upper body parts, sweating, cold, clammy skin, or racing heartbeat. Call 911 if you think you are having a heart attack. Take all cardiac medications as prescribed - notify your doctor if you have any side effects. Follow cardiac diet - avoid fatty & fried foods, don't eat too much red meat, eat lots of fruits & vegetables, and dairy products should be low fat. Please lose weight if you are overweight. Become more active with walking, gardening, or any other activity that gets you to moving.   Please return to the emergency department immediately for any new or concerning symptoms, or if you get worse.  It was a pleasure caring for you today in the emergency department.  Please return to the emergency department for any worsening or worrisome symptoms.   
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
## 2021-08-25 NOTE — ED Triage Notes (Signed)
Pt reported to ED with c/o intermittent chest pain since 0030 this morning. Pt states pain is on left side of her chest and does not radiate. Recently had heart attack a few month ago and expresses concern for the same. Denies any shortness of breath, dizziness or nausea/vomiting.  ?

## 2021-08-25 NOTE — ED Notes (Signed)
Pt states her IV site was hurting. Flushed by RN multiple times. Pt insisted to get IV removed at this time. Site is clean, dry and intact. No swelling noted. Will notify MD. ?

## 2021-08-27 ENCOUNTER — Encounter (HOSPITAL_COMMUNITY)
Admission: RE | Admit: 2021-08-27 | Discharge: 2021-08-27 | Disposition: A | Discharge status: Home / Self Care | Payer: Medicare Other | Source: Ambulatory Visit | Attending: Cardiology | Admitting: Cardiology

## 2021-08-27 DIAGNOSIS — Z48812 Encounter for surgical aftercare following surgery on the circulatory system: Secondary | ICD-10-CM | POA: Diagnosis not present

## 2021-08-27 DIAGNOSIS — I214 Non-ST elevation (NSTEMI) myocardial infarction: Secondary | ICD-10-CM | POA: Diagnosis not present

## 2021-08-27 LAB — GLUCOSE, CAPILLARY: Glucose-Capillary: 167 mg/dL — ABNORMAL HIGH (ref 70–99)

## 2021-08-28 DIAGNOSIS — R928 Other abnormal and inconclusive findings on diagnostic imaging of breast: Secondary | ICD-10-CM | POA: Diagnosis not present

## 2021-08-29 ENCOUNTER — Encounter (HOSPITAL_COMMUNITY): Payer: Medicare Other

## 2021-08-30 ENCOUNTER — Telehealth: Payer: Self-pay | Admitting: Cardiology

## 2021-08-30 DIAGNOSIS — N183 Chronic kidney disease, stage 3 unspecified: Secondary | ICD-10-CM | POA: Diagnosis not present

## 2021-08-30 DIAGNOSIS — E1121 Type 2 diabetes mellitus with diabetic nephropathy: Secondary | ICD-10-CM | POA: Diagnosis not present

## 2021-08-30 DIAGNOSIS — E785 Hyperlipidemia, unspecified: Secondary | ICD-10-CM | POA: Diagnosis not present

## 2021-08-30 DIAGNOSIS — I129 Hypertensive chronic kidney disease with stage 1 through stage 4 chronic kidney disease, or unspecified chronic kidney disease: Secondary | ICD-10-CM | POA: Diagnosis not present

## 2021-08-30 NOTE — Telephone Encounter (Signed)
? ?  Patient Name: [name]  ?DOB: 1946/02/15 ?MRN: [id number] ? ?Primary Cardiologist: None ? ?Chart reviewed as part of pre-operative protocol coverage. She has a history of stress-induced cardiomyopathy. Left cardiac catheterization in 04/2021 showed non-obstructive CAD. Last Echo in 05/2021 showed LVEF of 35-40%. She did recently present to the ED on 08/25/2021 with chest pain. However, work-up was unremarkable - high-sensitivity troponin negative x2 and EKG showed no acute ischemic changes. Pain resolved on it's own and she was felt to be stable for discharge. I received a call that patient is currently in the dentist office for routine cleaning without any anesthesia.  Simple dental extractions (i.e. 1-2 teeth) and cleanings are considered low risk procedures per guidelines and generally do not require any specific cardiac clearance. It is also generally accepted that for simple extractions and dental cleanings, there is no need to interrupt blood thinner therapy. ? ? ?SBE prophylaxis is not required for the patient from a cardiac standpoint. ? ?I will route this recommendation to the requesting party via Epic fax function and remove from pre-op pool. ? ?Please call with questions. ? ?Darreld Mclean, PA-C ?08/30/2021, 11:20 AM' ?

## 2021-08-30 NOTE — Telephone Encounter (Signed)
? ?  Pre-operative Risk Assessment  ?  ?Patient Name: [name]  ?DOB: November 08, 1945 ?MRN: [id number]  ? ? ? ?Request for Surgical Clearance   ? ?Procedure:   Cleaning  ? ?Date of Surgery:  Clearance 08/30/21                              ?   ?Surgeon:  Carita Pian  ?Surgeon's Group or Practice Name:  [facility] ?Phone number:  [phone] ?Fax number:  [phone] ?  ?Type of Clearance Requested:   ?- Medical  ?  ?Type of Anesthesia:  None  ?  ?Additional requests/questions:   ? ?Signed, ?[name]   ?08/30/2021, 11:08 AM  ?.3 ?

## 2021-08-31 ENCOUNTER — Encounter (HOSPITAL_COMMUNITY)
Admission: RE | Admit: 2021-08-31 | Discharge: 2021-08-31 | Disposition: A | Discharge status: Home / Self Care | Payer: Medicare Other | Source: Ambulatory Visit | Attending: Cardiology | Admitting: Cardiology

## 2021-08-31 ENCOUNTER — Other Ambulatory Visit: Payer: Medicare Other

## 2021-08-31 ENCOUNTER — Other Ambulatory Visit (HOSPITAL_COMMUNITY): Payer: Self-pay | Admitting: *Deleted

## 2021-08-31 DIAGNOSIS — I214 Non-ST elevation (NSTEMI) myocardial infarction: Secondary | ICD-10-CM

## 2021-08-31 DIAGNOSIS — I5181 Takotsubo syndrome: Secondary | ICD-10-CM

## 2021-08-31 NOTE — Progress Notes (Signed)
Incomplete Session Note ? ?Patient Details  ?Name: [name] ?MRN: [id number] ?Date of Birth: Apr 20, 1946 ?Referring Provider:   ?Flowsheet Row CARDIAC REHAB PHASE II ORIENTATION from 07/17/2021 in [facility]  ?Referring Provider Adrian Prows, MD  ? ?  ? ? ?Jolyne Loa did not complete her rehab session.  Patient reported to exercise today it was noted that Khrystina went to the emergency room with chest pain  on 08/25/21 and was discharged the same day. Samiyah has also switched cardiology care from Dr Einar Gip to Dr Martinique. Will need MD clearance for Karolyna to return to exercise at cardiac rehab. Ambree exercised on Monday she did not notify staff of her ED visit. [place] did not exercise today.Barnet Pall, RN,BSN ?08/31/2021 9:19 AM  ?

## 2021-08-31 NOTE — Telephone Encounter (Signed)
-----   Message from Peter M Martinique, MD sent at 08/31/2021 12:39 PM EDT ----- ?Regarding: RE: Cardiac Rehab Referral ?She may return. Thanks ? ?Peter Martinique MD, [facility] ? ? ?----- Message ----- ?From: Rowe Pavy, RN ?Sent: 08/31/2021   9:41 AM EDT ?To: Peter M Martinique, MD ?Subject: Cardiac Rehab Referral                        ? ?Dr. Martinique ? ?It has come to our attention the above patient who is currently enrolled in CR for several weeks switched providers from Dr. Einar Gip to you.  I have placed a referral pending your signature to cover the remaining sessions. Please co sign the referral in agreement. ? ? Pt had an ER visit on 4/29 with chest pain.  Dr. Marlou Porch consulted felt extremely low risk". Due to the cardiac related ER visit  Mellanie will need clearance to return to  cardiac rehab for exercise. ? ?May Kathren return to cardiac rehab or need to be seen for follow up first? ? ?Thank you for your valued input ? ?Cherre Huger, BSN ?Cardiac and Pulmonary Rehab Nurse Navigator  ? ? ? ? ?

## 2021-09-03 ENCOUNTER — Telehealth (HOSPITAL_COMMUNITY): Payer: Self-pay | Admitting: *Deleted

## 2021-09-03 ENCOUNTER — Encounter (HOSPITAL_COMMUNITY): Payer: Medicare Other

## 2021-09-03 NOTE — Progress Notes (Signed)
Discharge Progress Report ? ?Patient Details  ?Name: [name] ?MRN: [id number] ?Date of Birth: [birth year] ?Referring Provider:   ?Flowsheet Row CARDIAC REHAB PHASE II ORIENTATION from 07/17/2021 in [place]  ?Referring Provider Pamela Prows, MD  ? ?  ? ? ? ?Number of Visits: 14 ? ?Reason for Discharge:  ?Patient reached a stable level of exercise. ?Patient independent in their exercise. ?Early Exit:  [name] decided to go to the [facility] which she says is closer to where she lives ? ?Smoking History:  ?Social History  ? ?Tobacco Use  ?Smoking Status Former  ? Packs/day: 1.50  ? Years: 30.00  ? Pack years: 45.00  ? Types: Cigarettes  ? Quit date: 02/29/1992  ? Years since quitting: 29.5  ?Smokeless Tobacco Never  ? ? ?Diagnosis:  ?05/21/21 NSTEMI, [place] ? ?ADL [facility]: ? ? ?Initial Exercise Prescription: ? Initial Exercise Prescription - 07/17/21 1000   ? ?  ? Date of Initial Exercise RX and Referring Provider  ? Date 07/17/21   ? Referring Provider Pamela Prows, MD   ? Expected Discharge Date 09/14/21   ?  ? NuStep  ? Level 2   ? Minutes 15   ? METs 2.5   ?  ? Track  ? Laps 14   ? Minutes 15   ? METs 2.62   ?  ? Prescription Details  ? Frequency (times per week) 3   ? Duration Progress to 30 minutes of continuous aerobic without signs/symptoms of physical distress   ?  ? Intensity  ? THRR 40-80% of Max Heartrate 57-114   ? Ratings of Perceived Exertion 11-13   ? Perceived Dyspnea 0-4   ?  ? Progression  ? Progression Continue progressive overload as per policy without signs/symptoms or physical distress.   ?  ? Resistance Training  ? Training Prescription Yes   ? Weight 2 lbs   ? Reps 10-15   ? ?  ?  ? ?  ? ? ?Discharge Exercise Prescription (Final Exercise Prescription Changes): ? Exercise Prescription Changes - 08/15/21 1000   ? ?  ? Response to Exercise  ? Blood Pressure (Admit) 116/60   ? Blood Pressure (Exercise) 130/80   ? Blood Pressure (Exit) 116/64   ? Heart Rate (Admit) 57 bpm    ? Heart Rate (Exercise) 86 bpm   ? Heart Rate (Exit) 57 bpm   ? Rating of Perceived Exertion (Exercise) 13   ? Symptoms None   ? Comments Reviewed METs, goals, home exercise RX   ? Duration Continue with 30 min of aerobic exercise without signs/symptoms of physical distress.   ? Intensity THRR unchanged   ?  ? Progression  ? Progression Continue to progress workloads to maintain intensity without signs/symptoms of physical distress.   ? Average METs 3.1   ?  ? Resistance Training  ? Training Prescription No   ? Weight No weights on Wednesdays   ?  ? Interval Training  ? Interval Training No   ?  ? Recumbant Bike  ? Level 2   ? Minutes 15   ? METs 2.3   ?  ? Track  ? Laps 25   ? Minutes 15   ? METs 3.9   ?  ? Home Exercise Plan  ? Plans to continue exercise at [facility] (comment)   ? Frequency Add 2 additional days to program exercise sessions.   ? Initial Home Exercises Provided 08/15/21   ? ?  ?  ? ?  ? ? ?  Functional Capacity: ? 6 Minute Walk   ? ? [place] Name 07/17/21 1002  ?  ?  ?  ? 6 Minute Walk  ? Phase Initial    ? Distance 1653 feet    ? Walk Time 6 minutes    ? # of Rest Breaks 0    ? MPH 3.13    ? METS 3.17    ? RPE 11    ? Perceived Dyspnea  0    ? VO2 Peak 11.1    ? Symptoms No    ? Resting HR 74 bpm    ? Resting BP 128/78    ? Resting Oxygen Saturation  96 %    ? Exercise Oxygen Saturation  during 6 min walk 95 %    ? Max Ex. HR 86 bpm    ? Max Ex. BP 150/78    ? 2 Minute Post BP 136/70    ? ?  ?  ? ?  ? ? ?Psychological, QOL, Others - Outcomes: ?PHQ 2/9: ? ?  07/17/2021  ? 11:45 AM  ?Depression screen PHQ 2/9  ?Decreased Interest 0  ?Down, Depressed, Hopeless 0  ?PHQ - 2 Score 0  ? ? ?Quality of Life: ? Quality of Life - 07/17/21 1036   ? ?  ? Quality of Life  ? Select Quality of Life   ?  ? Quality of Life Scores  ? Health/Function Pre 25.1 %   ? Socioeconomic Pre 28.75 %   ? Psych/Spiritual Pre 25.71 %   ? Family Pre 27.6 %   ? GLOBAL Pre 26.27 %   ? ?  ?  ? ?  ? ? ?Personal Goals: ?Goals  established at orientation with interventions provided to work toward goal. ? Personal Goals and Risk Factors at Admission - 07/17/21 1038   ? ?  ? Core Components/Risk Factors/Patient Goals on Admission  ?  Weight Management Weight Maintenance;Yes   ? Intervention Weight Management: Provide education and appropriate resources to help participant work on and attain dietary goals.;Weight Management: Develop a combined nutrition and exercise program designed to reach desired caloric intake, while maintaining appropriate intake of nutrient and fiber, sodium and fats, and appropriate energy expenditure required for the weight goal.   ? Expected Outcomes Weight Maintenance: Understanding of the daily nutrition guidelines, which includes 25-35% calories from fat, 7% or less cal from saturated fats, less than '200mg'$  cholesterol, less than 1.5gm of sodium, & 5 or more servings of fruits and vegetables daily;Weight Loss: Understanding of general recommendations for a balanced deficit meal plan, which promotes 1-2 lb weight loss per week and includes a negative energy balance of [phone] kcal/d   ? Diabetes Yes   ? Intervention Provide education about signs/symptoms and action to take for hypo/hyperglycemia.;Provide education about proper nutrition, including hydration, and aerobic/resistive exercise prescription along with prescribed medications to achieve blood glucose in normal ranges: Fasting glucose 65-99 mg/dL   ? Expected Outcomes Short Term: Participant verbalizes understanding of the signs/symptoms and immediate care of hyper/hypoglycemia, proper foot care and importance of medication, aerobic/resistive exercise and nutrition plan for blood glucose control.;Long Term: Attainment of HbA1C < 7%.   ? Heart Failure Yes   ? Intervention Provide a combined exercise and nutrition program that is supplemented with education, support and counseling about heart failure. Directed toward relieving symptoms such as shortness of  breath, decreased exercise tolerance, and extremity edema.   ? Expected Outcomes Improve functional capacity of life;Short term: Attendance in  program 2-3 days a week with increased exercise capacity. Reported lower sodium intake. Reported increased fruit and vegetable intake. Reports medication compliance.;Short term: Daily weights obtained and reported for increase. Utilizing diuretic protocols set by physician.;Long term: Adoption of self-care skills and reduction of barriers for early signs and symptoms recognition and intervention leading to self-care maintenance.   ? Hypertension Yes   ? Intervention Provide education on lifestyle modifcations including regular physical activity/exercise, weight management, moderate sodium restriction and increased consumption of fresh fruit, vegetables, and low fat dairy, alcohol moderation, and smoking cessation.;Monitor prescription use compliance.   ? Expected Outcomes Short Term: Continued assessment and intervention until BP is < 140/49m HG in hypertensive participants. < 130/865mHG in hypertensive participants with diabetes, heart failure or chronic kidney disease.;Long Term: Maintenance of blood pressure at goal levels.   ? Lipids Yes   ? Intervention Provide education and support for participant on nutrition & aerobic/resistive exercise along with prescribed medications to achieve LDL '70mg'$ , HDL >'40mg'$ .   ? Expected Outcomes Short Term: Participant states understanding of desired cholesterol values and is compliant with medications prescribed. Participant is following exercise prescription and nutrition guidelines.;Long Term: Cholesterol controlled with medications as prescribed, with individualized exercise RX and with personalized nutrition plan. Value goals: LDL < '70mg'$ , HDL > 40 mg.   ? Stress Yes   ? Intervention Offer individual and/or small group education and counseling on adjustment to heart disease, stress management and health-related lifestyle change.  Teach and support self-help strategies.;Refer participants experiencing significant psychosocial distress to appropriate mental health specialists for further evaluation and treatment. When possible, include fami

## 2021-09-03 NOTE — Telephone Encounter (Signed)
Received call back from [place]. Lavonia does not plan to return to exercise at cardiac rehab. Carsen says she went to the [facility] this morning and will continue her exercise there as it is closer to where she lives. Aissa attended 14 exercise sessions between 07/23/21-08/27/21. Tylar did well with exercise while in attendance. Will discharge from the program at this time.Barnet Pall, RN,BSN ?09/03/2021 12:11 PM  ?

## 2021-09-03 NOTE — Telephone Encounter (Signed)
Left message to call cardiac rehab.Latisha Lasch Walden Murle Otting RN BSN  

## 2021-09-05 ENCOUNTER — Encounter (HOSPITAL_COMMUNITY): Payer: Medicare Other

## 2021-09-06 ENCOUNTER — Ambulatory Visit: Payer: Medicare Other | Admitting: Student

## 2021-09-07 ENCOUNTER — Encounter (HOSPITAL_COMMUNITY): Payer: Medicare Other

## 2021-09-07 ENCOUNTER — Telehealth: Payer: Self-pay | Admitting: Cardiology

## 2021-09-07 NOTE — Telephone Encounter (Signed)
Pt c/o medication issue: ? ?1. Name of Medication:  ?sacubitril-valsartan (ENTRESTO) 97-103 MG ?empagliflozin (JARDIANCE) 10 MG TABS tablet ? ?2. How are you currently taking this medication (dosage and times per day)?  ?Patient takes both medications as prescribed. ? ?3. Are you having a reaction (difficulty breathing--STAT)?  ?No  ? ?4. What is your medication issue?  ? ?Patient states she is in the donut hole for her medications this month and Delene Loll will cost her $168/month and Vania Rea will cost $420/90 days which she is unable to afford. She is requesting to have an Rx for a 30 day supply sent to the [facility] if possible. She states she can get the medication for free thru the [facility] and this should last her until things are fixed with the insurance company. ? ?

## 2021-09-10 ENCOUNTER — Encounter (HOSPITAL_COMMUNITY): Payer: Medicare Other

## 2021-09-11 ENCOUNTER — Ambulatory Visit (HOSPITAL_COMMUNITY): Payer: Medicare Other | Attending: Internal Medicine

## 2021-09-11 DIAGNOSIS — I2510 Atherosclerotic heart disease of native coronary artery without angina pectoris: Secondary | ICD-10-CM | POA: Diagnosis not present

## 2021-09-11 DIAGNOSIS — I5181 Takotsubo syndrome: Secondary | ICD-10-CM | POA: Insufficient documentation

## 2021-09-11 DIAGNOSIS — I5022 Chronic systolic (congestive) heart failure: Secondary | ICD-10-CM | POA: Diagnosis not present

## 2021-09-11 DIAGNOSIS — E7800 Pure hypercholesterolemia, unspecified: Secondary | ICD-10-CM | POA: Insufficient documentation

## 2021-09-11 LAB — ECHOCARDIOGRAM COMPLETE
Area-P 1/2: 2.42 cm2
P 1/2 time: 919 msec
S' Lateral: 2.3 cm

## 2021-09-11 NOTE — Telephone Encounter (Signed)
Spoke to patient 5/15 she stated [name] and Jardiance too expensive.Advised office does not have Entresto 97/103 mg samples.Jardiance 10 mg samples left at front desk along with patient assistance forms for [place] and Jardiance.Advised to complete forms and bring back to office along with proof of income.I will fax to patient assistance. ?

## 2021-09-12 ENCOUNTER — Encounter (HOSPITAL_COMMUNITY): Payer: Medicare Other

## 2021-09-12 DIAGNOSIS — I5043 Acute on chronic combined systolic (congestive) and diastolic (congestive) heart failure: Secondary | ICD-10-CM | POA: Diagnosis not present

## 2021-09-12 DIAGNOSIS — E1121 Type 2 diabetes mellitus with diabetic nephropathy: Secondary | ICD-10-CM | POA: Diagnosis not present

## 2021-09-12 DIAGNOSIS — I129 Hypertensive chronic kidney disease with stage 1 through stage 4 chronic kidney disease, or unspecified chronic kidney disease: Secondary | ICD-10-CM | POA: Diagnosis not present

## 2021-09-12 DIAGNOSIS — E785 Hyperlipidemia, unspecified: Secondary | ICD-10-CM | POA: Diagnosis not present

## 2021-09-13 ENCOUNTER — Telehealth: Payer: Self-pay | Admitting: Cardiology

## 2021-09-13 NOTE — Telephone Encounter (Signed)
OK  PJ

## 2021-09-13 NOTE — Telephone Encounter (Signed)
Lesly Rubenstein wanted to make Dr. [place] aware that they are faxing over a Pt Assistance form for him to complete to help with the cost of pt's Entresto. Please advise

## 2021-09-13 NOTE — Telephone Encounter (Signed)
  Pt c/o of Chest Pain: STAT if CP now or developed within 24 hours  1. Are you having CP right now? This morning  2. Are you experiencing any other symptoms (ex. SOB, nausea, vomiting, sweating)? None   3. How long have you been experiencing CP? Last night  4. Is your CP continuous or coming and going? Coming and going   5. Have you taken Nitroglycerin? No   Pt said, she's feeling pain in her left side she feels like its on her breast. Started last night and this morning  ?

## 2021-09-13 NOTE — Telephone Encounter (Signed)
Received a call from patient.She stated she has been having episodes of chest pain off and on since yesterday.Stated pain is in left breast.Pain last for appox less than 1 min.No sob.No pain at present.No PA appointments available.Appointment scheduled with Dr.Jordan 5/22 at 2:40 pm.Advised if she has any more chest pain she needs to go to ED to be evaluated.

## 2021-09-13 NOTE — Telephone Encounter (Signed)
Will route to primary nurse to make aware to be on the look out for paperwork.   Thanks!

## 2021-09-14 ENCOUNTER — Encounter (HOSPITAL_COMMUNITY): Payer: Medicare Other
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
## 2021-09-17 ENCOUNTER — Ambulatory Visit: Payer: Medicare Other | Admitting: Cardiology

## 2021-09-17 ENCOUNTER — Encounter: Payer: Self-pay | Admitting: Cardiology

## 2021-09-17 VITALS — BP 118/62 | HR 64 | Ht 63.0 in | Wt 134.0 lb

## 2021-09-17 DIAGNOSIS — I5022 Chronic systolic (congestive) heart failure: Secondary | ICD-10-CM

## 2021-09-17 DIAGNOSIS — E7800 Pure hypercholesterolemia, unspecified: Secondary | ICD-10-CM | POA: Diagnosis not present

## 2021-09-17 DIAGNOSIS — I5181 Takotsubo syndrome: Secondary | ICD-10-CM | POA: Diagnosis not present

## 2021-09-17 DIAGNOSIS — I2510 Atherosclerotic heart disease of native coronary artery without angina pectoris: Secondary | ICD-10-CM

## 2021-09-18 ENCOUNTER — Telehealth: Payer: Self-pay | Admitting: Cardiology

## 2021-09-18 NOTE — Telephone Encounter (Signed)
  Pt c/o medication issue:  1. Name of Medication: sacubitril-valsartan (ENTRESTO) 97-103 MG  2. How are you currently taking this medication (dosage and times per day)? Take 1 tablet by mouth 2 (two) times daily.  3. Are you having a reaction (difficulty breathing--STAT)?   4. What is your medication issue? Pt said, her pcp helped her enroll for pt assistance for her jardiance. Dr. Dema Severin sen the application to [place] for her entresto. Pt needs help applying for pt assistance for her entresto

## 2021-09-18 NOTE — Telephone Encounter (Signed)
Returned call to patient-patient states PCP faxed patient assistance to Dr. Morrison Old office to complete.  Advised nurse is out of office today but would route to her to follow up on return.

## 2021-09-21 DIAGNOSIS — M25561 Pain in right knee: Secondary | ICD-10-CM | POA: Diagnosis not present

## 2021-09-26 MED ORDER — SACUBITRIL-VALSARTAN 97-103 MG PO TABS
1.0000 | ORAL_TABLET | Freq: Two times a day (BID) | ORAL | 3 refills | Status: DC
Start: 2021-09-26 — End: 2021-10-05

## 2021-09-26 NOTE — Telephone Encounter (Signed)
Returned call to pt, informed that pt has been out on emergency medical leave since last week, maybe longer. She states that is fine. Informed pt that the forms that we received are the incorrect forms and I will fill out the pt assistance forms for her. She will be in to sign about noon. Form completed. All attachments printed. Here on triage desk for pt signature.

## 2021-09-26 NOTE — Telephone Encounter (Signed)
Pt is requesting a call back in regards to the patient assistance program. She says that she has been having to buy it 5 days at a time and would like to have a call back today to get this situated.
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
## 2021-09-27 ENCOUNTER — Encounter: Payer: Self-pay | Admitting: Adult Health

## 2021-09-27 ENCOUNTER — Ambulatory Visit: Payer: Medicare Other | Admitting: Adult Health

## 2021-09-27 VITALS — BP 118/55 | HR 61 | Ht 63.0 in | Wt 138.0 lb

## 2021-09-27 DIAGNOSIS — G3184 Mild cognitive impairment, so stated: Secondary | ICD-10-CM

## 2021-09-27 NOTE — Patient Instructions (Signed)
No changes today - try to increase daily memory exercises which can help with short term memory concerns   Follow up in 1 year or call earlier if needed

## 2021-09-27 NOTE — Telephone Encounter (Signed)
Late entry: pt cam in and signed paperwork. Faxed to company. Written rx given to Ivin Booty for PJ to sign tomorrow.

## 2021-10-01 ENCOUNTER — Other Ambulatory Visit: Payer: Self-pay

## 2021-10-05 ENCOUNTER — Other Ambulatory Visit: Payer: Self-pay | Admitting: *Deleted

## 2021-10-05 MED ORDER — SACUBITRIL-VALSARTAN 97-103 MG PO TABS: 1.0000 | ORAL_TABLET | Freq: Two times a day (BID) | ORAL | 3 refills | Status: DC

## 2021-10-11 DIAGNOSIS — M353 Polymyalgia rheumatica: Secondary | ICD-10-CM | POA: Diagnosis not present

## 2021-10-11 DIAGNOSIS — M0579 Rheumatoid arthritis with rheumatoid factor of multiple sites without organ or systems involvement: Secondary | ICD-10-CM | POA: Diagnosis not present

## 2021-10-11 DIAGNOSIS — M1991 Primary osteoarthritis, unspecified site: Secondary | ICD-10-CM | POA: Diagnosis not present

## 2021-10-11 DIAGNOSIS — Z79899 Other long term (current) drug therapy: Secondary | ICD-10-CM | POA: Diagnosis not present

## 2021-10-15 DIAGNOSIS — M85851 Other specified disorders of bone density and structure, right thigh: Secondary | ICD-10-CM | POA: Diagnosis not present

## 2021-10-15 DIAGNOSIS — M85852 Other specified disorders of bone density and structure, left thigh: Secondary | ICD-10-CM | POA: Diagnosis not present

## 2021-10-15 DIAGNOSIS — Z780 Asymptomatic menopausal state: Secondary | ICD-10-CM | POA: Diagnosis not present

## 2021-10-24 DIAGNOSIS — M0579 Rheumatoid arthritis with rheumatoid factor of multiple sites without organ or systems involvement: Secondary | ICD-10-CM | POA: Diagnosis not present

## 2021-10-24 DIAGNOSIS — M1991 Primary osteoarthritis, unspecified site: Secondary | ICD-10-CM | POA: Diagnosis not present

## 2021-10-24 DIAGNOSIS — M353 Polymyalgia rheumatica: Secondary | ICD-10-CM | POA: Diagnosis not present

## 2021-10-24 DIAGNOSIS — Z79899 Other long term (current) drug therapy: Secondary | ICD-10-CM | POA: Diagnosis not present

## 2021-10-31 DIAGNOSIS — E785 Hyperlipidemia, unspecified: Secondary | ICD-10-CM | POA: Diagnosis not present

## 2021-10-31 DIAGNOSIS — I10 Essential (primary) hypertension: Secondary | ICD-10-CM | POA: Diagnosis not present

## 2021-10-31 DIAGNOSIS — I5043 Acute on chronic combined systolic (congestive) and diastolic (congestive) heart failure: Secondary | ICD-10-CM | POA: Diagnosis not present

## 2021-10-31 DIAGNOSIS — E1121 Type 2 diabetes mellitus with diabetic nephropathy: Secondary | ICD-10-CM | POA: Diagnosis not present
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
## 2021-11-14 ENCOUNTER — Ambulatory Visit: Payer: Medicare Other | Admitting: Cardiology

## 2021-11-14 ENCOUNTER — Encounter: Payer: Self-pay | Admitting: Cardiology

## 2021-11-14 VITALS — BP 130/76 | HR 60 | Ht 63.0 in | Wt 133.6 lb

## 2021-11-14 DIAGNOSIS — I5022 Chronic systolic (congestive) heart failure: Secondary | ICD-10-CM | POA: Diagnosis not present

## 2021-11-14 DIAGNOSIS — I5181 Takotsubo syndrome: Secondary | ICD-10-CM

## 2021-11-14 DIAGNOSIS — I2510 Atherosclerotic heart disease of native coronary artery without angina pectoris: Secondary | ICD-10-CM | POA: Diagnosis not present

## 2021-11-14 DIAGNOSIS — E7800 Pure hypercholesterolemia, unspecified: Secondary | ICD-10-CM | POA: Diagnosis not present

## 2021-11-14 MED ORDER — METOPROLOL SUCCINATE ER 100 MG PO TB24: 100.0000 mg | ORAL_TABLET | Freq: Every day | ORAL | 3 refills | Status: AC

## 2021-11-30 DIAGNOSIS — E1169 Type 2 diabetes mellitus with other specified complication: Secondary | ICD-10-CM | POA: Diagnosis not present

## 2021-11-30 DIAGNOSIS — E785 Hyperlipidemia, unspecified: Secondary | ICD-10-CM | POA: Diagnosis not present

## 2021-11-30 DIAGNOSIS — I10 Essential (primary) hypertension: Secondary | ICD-10-CM | POA: Diagnosis not present

## 2021-11-30 DIAGNOSIS — I5043 Acute on chronic combined systolic (congestive) and diastolic (congestive) heart failure: Secondary | ICD-10-CM | POA: Diagnosis not present

## 2021-12-20 DIAGNOSIS — B078 Other viral warts: Secondary | ICD-10-CM | POA: Diagnosis not present

## 2021-12-20 DIAGNOSIS — L298 Other pruritus: Secondary | ICD-10-CM | POA: Diagnosis not present

## 2021-12-24 DIAGNOSIS — I5043 Acute on chronic combined systolic (congestive) and diastolic (congestive) heart failure: Secondary | ICD-10-CM | POA: Diagnosis not present

## 2021-12-24 DIAGNOSIS — E785 Hyperlipidemia, unspecified: Secondary | ICD-10-CM | POA: Diagnosis not present

## 2021-12-24 DIAGNOSIS — E1121 Type 2 diabetes mellitus with diabetic nephropathy: Secondary | ICD-10-CM | POA: Diagnosis not present

## 2021-12-24 DIAGNOSIS — I10 Essential (primary) hypertension: Secondary | ICD-10-CM | POA: Diagnosis not present

## 2022-01-09 DIAGNOSIS — M1991 Primary osteoarthritis, unspecified site: Secondary | ICD-10-CM | POA: Diagnosis not present

## 2022-01-09 DIAGNOSIS — Z79899 Other long term (current) drug therapy: Secondary | ICD-10-CM | POA: Diagnosis not present

## 2022-01-09 DIAGNOSIS — M0579 Rheumatoid arthritis with rheumatoid factor of multiple sites without organ or systems involvement: Secondary | ICD-10-CM | POA: Diagnosis not present

## 2022-01-09 DIAGNOSIS — M353 Polymyalgia rheumatica: Secondary | ICD-10-CM | POA: Diagnosis not present

## 2022-01-24 DIAGNOSIS — E785 Hyperlipidemia, unspecified: Secondary | ICD-10-CM | POA: Diagnosis not present

## 2022-01-24 DIAGNOSIS — I10 Essential (primary) hypertension: Secondary | ICD-10-CM | POA: Diagnosis not present

## 2022-01-24 DIAGNOSIS — I5043 Acute on chronic combined systolic (congestive) and diastolic (congestive) heart failure: Secondary | ICD-10-CM | POA: Diagnosis not present

## 2022-01-24 DIAGNOSIS — E1121 Type 2 diabetes mellitus with diabetic nephropathy: Secondary | ICD-10-CM | POA: Diagnosis not present

## 2022-01-29 DIAGNOSIS — N183 Chronic kidney disease, stage 3 unspecified: Secondary | ICD-10-CM | POA: Diagnosis not present

## 2022-01-29 DIAGNOSIS — I10 Essential (primary) hypertension: Secondary | ICD-10-CM | POA: Diagnosis not present

## 2022-01-29 DIAGNOSIS — E1121 Type 2 diabetes mellitus with diabetic nephropathy: Secondary | ICD-10-CM | POA: Diagnosis not present

## 2022-01-29 DIAGNOSIS — E785 Hyperlipidemia, unspecified: Secondary | ICD-10-CM | POA: Diagnosis not present

## 2022-02-27 DIAGNOSIS — E1121 Type 2 diabetes mellitus with diabetic nephropathy: Secondary | ICD-10-CM | POA: Diagnosis not present

## 2022-02-27 DIAGNOSIS — N183 Chronic kidney disease, stage 3 unspecified: Secondary | ICD-10-CM | POA: Diagnosis not present

## 2022-02-27 DIAGNOSIS — E785 Hyperlipidemia, unspecified: Secondary | ICD-10-CM | POA: Diagnosis not present

## 2022-02-27 DIAGNOSIS — I129 Hypertensive chronic kidney disease with stage 1 through stage 4 chronic kidney disease, or unspecified chronic kidney disease: Secondary | ICD-10-CM | POA: Diagnosis not present

## 2022-03-12 DIAGNOSIS — M0579 Rheumatoid arthritis with rheumatoid factor of multiple sites without organ or systems involvement: Secondary | ICD-10-CM | POA: Diagnosis not present

## 2022-03-12 DIAGNOSIS — Z79899 Other long term (current) drug therapy: Secondary | ICD-10-CM | POA: Diagnosis not present

## 2022-03-12 DIAGNOSIS — M1991 Primary osteoarthritis, unspecified site: Secondary | ICD-10-CM | POA: Diagnosis not present

## 2022-03-12 DIAGNOSIS — M353 Polymyalgia rheumatica: Secondary | ICD-10-CM | POA: Diagnosis not present

## 2022-03-12 DIAGNOSIS — L298 Other pruritus: Secondary | ICD-10-CM | POA: Diagnosis not present

## 2022-03-14 ENCOUNTER — Ambulatory Visit: Payer: Medicare Other | Admitting: Cardiology

## 2022-03-27 DIAGNOSIS — E1121 Type 2 diabetes mellitus with diabetic nephropathy: Secondary | ICD-10-CM | POA: Diagnosis not present

## 2022-03-27 DIAGNOSIS — Z0000 Encounter for general adult medical examination without abnormal findings: Secondary | ICD-10-CM | POA: Diagnosis not present

## 2022-03-27 DIAGNOSIS — N183 Chronic kidney disease, stage 3 unspecified: Secondary | ICD-10-CM | POA: Diagnosis not present

## 2022-03-27 DIAGNOSIS — I129 Hypertensive chronic kidney disease with stage 1 through stage 4 chronic kidney disease, or unspecified chronic kidney disease: Secondary | ICD-10-CM | POA: Diagnosis not present

## 2022-04-02 ENCOUNTER — Telehealth: Payer: Self-pay | Admitting: Cardiology

## 2022-04-02 NOTE — Telephone Encounter (Signed)
  Pt is calling to get an update from the pt assistance she dropped off last week

## 2022-04-03 ENCOUNTER — Other Ambulatory Visit: Payer: Self-pay

## 2022-04-03 DIAGNOSIS — M65321 Trigger finger, right index finger: Secondary | ICD-10-CM | POA: Diagnosis not present

## 2022-04-03 MED ORDER — SACUBITRIL-VALSARTAN 97-103 MG PO TABS: 1.0000 | ORAL_TABLET | Freq: Two times a day (BID) | ORAL | 3 refills | Status: DC

## 2022-04-03 NOTE — Telephone Encounter (Signed)
Called patient left message on personal voice mail to call back. 

## 2022-04-03 NOTE — Telephone Encounter (Signed)
Spoke to patient [employer] patient assistance paperwork completed and faxed to fax # [phone].

## 2022-05-08 ENCOUNTER — Ambulatory Visit: Payer: Medicare Other | Admitting: Cardiology
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
## 2022-06-11 DIAGNOSIS — H90A31 Mixed conductive and sensorineural hearing loss, unilateral, right ear with restricted hearing on the contralateral side: Secondary | ICD-10-CM | POA: Diagnosis not present

## 2022-06-11 DIAGNOSIS — H6121 Impacted cerumen, right ear: Secondary | ICD-10-CM | POA: Diagnosis not present

## 2022-06-11 DIAGNOSIS — Z9622 Myringotomy tube(s) status: Secondary | ICD-10-CM | POA: Diagnosis not present

## 2022-06-11 DIAGNOSIS — H6981 Other specified disorders of Eustachian tube, right ear: Secondary | ICD-10-CM | POA: Diagnosis not present

## 2022-06-13 ENCOUNTER — Ambulatory Visit: Payer: Medicare Other | Admitting: Cardiology

## 2022-06-17 ENCOUNTER — Encounter: Payer: Self-pay | Admitting: Nurse Practitioner

## 2022-06-17 ENCOUNTER — Ambulatory Visit: Payer: Medicare Other | Attending: Nurse Practitioner | Admitting: Nurse Practitioner

## 2022-06-17 VITALS — BP 136/76 | HR 68 | Ht 63.0 in | Wt 119.2 lb

## 2022-06-17 DIAGNOSIS — I2510 Atherosclerotic heart disease of native coronary artery without angina pectoris: Secondary | ICD-10-CM

## 2022-06-17 DIAGNOSIS — I5181 Takotsubo syndrome: Secondary | ICD-10-CM

## 2022-06-17 DIAGNOSIS — M353 Polymyalgia rheumatica: Secondary | ICD-10-CM

## 2022-06-17 DIAGNOSIS — I10 Essential (primary) hypertension: Secondary | ICD-10-CM | POA: Diagnosis not present

## 2022-06-17 DIAGNOSIS — E1169 Type 2 diabetes mellitus with other specified complication: Secondary | ICD-10-CM

## 2022-06-17 DIAGNOSIS — E785 Hyperlipidemia, unspecified: Secondary | ICD-10-CM

## 2022-06-17 DIAGNOSIS — I5022 Chronic systolic (congestive) heart failure: Secondary | ICD-10-CM | POA: Diagnosis not present

## 2022-06-17 LAB — BASIC METABOLIC PANEL
BUN/Creatinine Ratio: 29 — ABNORMAL HIGH (ref 12–28)
BUN: 33 mg/dL — ABNORMAL HIGH (ref 8–27)
CO2: 27 mmol/L (ref 20–29)
Calcium: 10 mg/dL (ref 8.7–10.3)
Chloride: 99 mmol/L (ref 96–106)
Creatinine, Ser: 1.13 mg/dL — ABNORMAL HIGH (ref 0.57–1.00)
Glucose: 157 mg/dL — ABNORMAL HIGH (ref 70–99)
Potassium: 4.4 mmol/L (ref 3.5–5.2)
Sodium: 141 mmol/L (ref 134–144)
eGFR: 50 mL/min/{1.73_m2} — ABNORMAL LOW (ref >59)
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
## 2022-06-17 NOTE — Patient Instructions (Signed)
Medication Instructions:  Your physician recommends that you continue on your current medications as directed. Please refer to the Current Medication list given to you today.   *If you need a refill on your cardiac medications before your next appointment, please call your pharmacy*   Lab Work: Your physician recommends that you complete lab work today. BMET  If you have labs (blood work) drawn today and your tests are completely normal, you will receive your results only by: [place] (if you have MyChart) OR A paper copy in the mail If you have any lab test that is abnormal or we need to change your treatment, we will call you to review the results.   Testing/Procedures: NONE ordered at this time of appointment     Follow-Up: At [facility], you and your health needs are our priority.  As part of our continuing mission to provide you with exceptional heart care, we have created designated Provider Care Teams.  These Care Teams include your primary Cardiologist (physician) and Advanced Practice Providers (APPs -  Physician Assistants and Nurse Practitioners) who all work together to provide you with the care you need, when you need it.  We recommend signing up for the patient portal called "MyChart".  Sign up information is provided on this After Visit Summary.  MyChart is used to connect with patients for Virtual Visits (Telemedicine).  Patients are able to view lab/test results, encounter notes, upcoming appointments, etc.  Non-urgent messages can be sent to your provider as well.   To learn more about what you can do with MyChart, go to [email or web address].    Your next appointment:   6 month(s)  Provider:   Peter Martinique, MD     Other Instructions

## 2022-06-17 NOTE — Addendum Note (Signed)
Addended by: Diona Browner C on: 06/17/2022 11:44 AM   Modules accepted: Level of Service

## 2022-06-18 ENCOUNTER — Telehealth: Payer: Self-pay

## 2022-06-18 NOTE — Telephone Encounter (Signed)
Lmom to discuss lab results. Waiting on a return call.

## 2022-06-19 NOTE — Telephone Encounter (Signed)
Pt returned call. Pt was notified of results and recommendations. Pt will continue current medication and f/u as planned.

## 2022-06-21 ENCOUNTER — Other Ambulatory Visit: Payer: Self-pay | Admitting: Family Medicine

## 2022-06-21 DIAGNOSIS — I5022 Chronic systolic (congestive) heart failure: Secondary | ICD-10-CM | POA: Diagnosis not present

## 2022-06-21 DIAGNOSIS — N183 Chronic kidney disease, stage 3 unspecified: Secondary | ICD-10-CM | POA: Diagnosis not present

## 2022-06-21 DIAGNOSIS — I129 Hypertensive chronic kidney disease with stage 1 through stage 4 chronic kidney disease, or unspecified chronic kidney disease: Secondary | ICD-10-CM | POA: Diagnosis not present

## 2022-06-21 DIAGNOSIS — R0989 Other specified symptoms and signs involving the circulatory and respiratory systems: Secondary | ICD-10-CM

## 2022-06-21 DIAGNOSIS — E1169 Type 2 diabetes mellitus with other specified complication: Secondary | ICD-10-CM | POA: Diagnosis not present

## 2022-06-21 DIAGNOSIS — M353 Polymyalgia rheumatica: Secondary | ICD-10-CM | POA: Diagnosis not present

## 2022-06-21 DIAGNOSIS — E1122 Type 2 diabetes mellitus with diabetic chronic kidney disease: Secondary | ICD-10-CM | POA: Diagnosis not present

## 2022-06-21 DIAGNOSIS — E785 Hyperlipidemia, unspecified: Secondary | ICD-10-CM | POA: Diagnosis not present

## 2022-06-25 ENCOUNTER — Telehealth: Payer: Self-pay | Admitting: *Deleted

## 2022-06-25 DIAGNOSIS — H43813 Vitreous degeneration, bilateral: Secondary | ICD-10-CM | POA: Diagnosis not present

## 2022-06-25 DIAGNOSIS — M0579 Rheumatoid arthritis with rheumatoid factor of multiple sites without organ or systems involvement: Secondary | ICD-10-CM | POA: Diagnosis not present

## 2022-06-25 DIAGNOSIS — E119 Type 2 diabetes mellitus without complications: Secondary | ICD-10-CM | POA: Diagnosis not present

## 2022-06-25 DIAGNOSIS — M353 Polymyalgia rheumatica: Secondary | ICD-10-CM | POA: Diagnosis not present

## 2022-06-25 DIAGNOSIS — Z79899 Other long term (current) drug therapy: Secondary | ICD-10-CM | POA: Diagnosis not present

## 2022-06-25 DIAGNOSIS — H26491 Other secondary cataract, right eye: Secondary | ICD-10-CM | POA: Diagnosis not present

## 2022-06-25 DIAGNOSIS — M1991 Primary osteoarthritis, unspecified site: Secondary | ICD-10-CM | POA: Diagnosis not present

## 2022-06-25 NOTE — Telephone Encounter (Signed)
Patient walked into the office today with questions regarding the patient assistance for entresto. According to the company, they need the husbands proof of income and they need tax information. She brought her husbands income with her and that has been faxed to the company. Explained to patient she will need to call the company and let them know she does not file taxes.

## 2022-06-28 ENCOUNTER — Other Ambulatory Visit: Payer: Self-pay

## 2022-06-28 ENCOUNTER — Inpatient Hospital Stay (HOSPITAL_COMMUNITY): Payer: Medicare Other

## 2022-06-28 ENCOUNTER — Encounter (HOSPITAL_COMMUNITY): Payer: Self-pay

## 2022-06-28 ENCOUNTER — Inpatient Hospital Stay (HOSPITAL_COMMUNITY)
Admission: EM | Admit: 2022-06-28 | Discharge: 2022-07-02 | DRG: 481 | Disposition: A | Discharge status: Skilled Nursing Facility | Payer: Medicare Other | Attending: Internal Medicine | Admitting: Internal Medicine

## 2022-06-28 DIAGNOSIS — I5181 Takotsubo syndrome: Secondary | ICD-10-CM | POA: Diagnosis present

## 2022-06-28 DIAGNOSIS — I110 Hypertensive heart disease with heart failure: Secondary | ICD-10-CM | POA: Diagnosis present

## 2022-06-28 DIAGNOSIS — Z8249 Family history of ischemic heart disease and other diseases of the circulatory system: Secondary | ICD-10-CM

## 2022-06-28 DIAGNOSIS — Z4889 Encounter for other specified surgical aftercare: Secondary | ICD-10-CM | POA: Diagnosis not present

## 2022-06-28 DIAGNOSIS — S72141A Displaced intertrochanteric fracture of right femur, initial encounter for closed fracture: Principal | ICD-10-CM

## 2022-06-28 DIAGNOSIS — Z7952 Long term (current) use of systemic steroids: Secondary | ICD-10-CM

## 2022-06-28 DIAGNOSIS — Z8673 Personal history of transient ischemic attack (TIA), and cerebral infarction without residual deficits: Secondary | ICD-10-CM

## 2022-06-28 DIAGNOSIS — Z7984 Long term (current) use of oral hypoglycemic drugs: Secondary | ICD-10-CM

## 2022-06-28 DIAGNOSIS — Y92019 Unspecified place in single-family (private) house as the place of occurrence of the external cause: Secondary | ICD-10-CM

## 2022-06-28 DIAGNOSIS — M1990 Unspecified osteoarthritis, unspecified site: Secondary | ICD-10-CM | POA: Diagnosis not present

## 2022-06-28 DIAGNOSIS — E119 Type 2 diabetes mellitus without complications: Secondary | ICD-10-CM | POA: Diagnosis present

## 2022-06-28 DIAGNOSIS — S7291XA Unspecified fracture of right femur, initial encounter for closed fracture: Secondary | ICD-10-CM | POA: Diagnosis not present

## 2022-06-28 DIAGNOSIS — Z888 Allergy status to other drugs, medicaments and biological substances status: Secondary | ICD-10-CM | POA: Diagnosis not present

## 2022-06-28 DIAGNOSIS — I509 Heart failure, unspecified: Secondary | ICD-10-CM | POA: Diagnosis not present

## 2022-06-28 DIAGNOSIS — E785 Hyperlipidemia, unspecified: Secondary | ICD-10-CM | POA: Diagnosis not present

## 2022-06-28 DIAGNOSIS — Z7982 Long term (current) use of aspirin: Secondary | ICD-10-CM | POA: Diagnosis not present

## 2022-06-28 DIAGNOSIS — G4700 Insomnia, unspecified: Secondary | ICD-10-CM | POA: Diagnosis present

## 2022-06-28 DIAGNOSIS — Z87891 Personal history of nicotine dependence: Secondary | ICD-10-CM

## 2022-06-28 DIAGNOSIS — S72144D Nondisplaced intertrochanteric fracture of right femur, subsequent encounter for closed fracture with routine healing: Secondary | ICD-10-CM | POA: Diagnosis not present

## 2022-06-28 DIAGNOSIS — S72144A Nondisplaced intertrochanteric fracture of right femur, initial encounter for closed fracture: Secondary | ICD-10-CM | POA: Diagnosis not present

## 2022-06-28 DIAGNOSIS — Z01818 Encounter for other preprocedural examination: Secondary | ICD-10-CM | POA: Diagnosis not present

## 2022-06-28 DIAGNOSIS — I252 Old myocardial infarction: Secondary | ICD-10-CM | POA: Diagnosis not present

## 2022-06-28 DIAGNOSIS — Z79899 Other long term (current) drug therapy: Secondary | ICD-10-CM | POA: Diagnosis not present

## 2022-06-28 DIAGNOSIS — W010XXA Fall on same level from slipping, tripping and stumbling without subsequent striking against object, initial encounter: Secondary | ICD-10-CM | POA: Diagnosis not present

## 2022-06-28 DIAGNOSIS — F419 Anxiety disorder, unspecified: Secondary | ICD-10-CM | POA: Diagnosis not present

## 2022-06-28 DIAGNOSIS — I10 Essential (primary) hypertension: Secondary | ICD-10-CM | POA: Diagnosis not present

## 2022-06-28 DIAGNOSIS — Z4789 Encounter for other orthopedic aftercare: Secondary | ICD-10-CM | POA: Diagnosis not present

## 2022-06-28 DIAGNOSIS — I5042 Chronic combined systolic (congestive) and diastolic (congestive) heart failure: Secondary | ICD-10-CM | POA: Diagnosis not present

## 2022-06-28 DIAGNOSIS — Z885 Allergy status to narcotic agent status: Secondary | ICD-10-CM

## 2022-06-28 DIAGNOSIS — Z823 Family history of stroke: Secondary | ICD-10-CM | POA: Diagnosis not present

## 2022-06-28 DIAGNOSIS — M069 Rheumatoid arthritis, unspecified: Secondary | ICD-10-CM | POA: Diagnosis not present

## 2022-06-28 DIAGNOSIS — I2510 Atherosclerotic heart disease of native coronary artery without angina pectoris: Secondary | ICD-10-CM | POA: Diagnosis present

## 2022-06-28 DIAGNOSIS — S72001A Fracture of unspecified part of neck of right femur, initial encounter for closed fracture: Secondary | ICD-10-CM | POA: Diagnosis not present

## 2022-06-28 DIAGNOSIS — I5043 Acute on chronic combined systolic (congestive) and diastolic (congestive) heart failure: Secondary | ICD-10-CM | POA: Diagnosis not present

## 2022-06-28 DIAGNOSIS — S7291XD Unspecified fracture of right femur, subsequent encounter for closed fracture with routine healing: Secondary | ICD-10-CM | POA: Diagnosis not present

## 2022-06-28 DIAGNOSIS — M25551 Pain in right hip: Secondary | ICD-10-CM | POA: Diagnosis not present

## 2022-06-28 LAB — CBC WITH DIFFERENTIAL/PLATELET
Abs Immature Granulocytes: 0.03 10*3/uL (ref 0.00–0.07)
Basophils Absolute: 0 10*3/uL (ref 0.0–0.1)
Basophils Relative: 0 %
Eosinophils Absolute: 0 10*3/uL (ref 0.0–0.5)
Eosinophils Relative: 0 %
HCT: 45 % (ref 36.0–46.0)
Hemoglobin: 14.6 g/dL (ref 12.0–15.0)
Immature Granulocytes: 0 %
Lymphocytes Relative: 15 %
Lymphs Abs: 1.4 10*3/uL (ref 0.7–4.0)
MCH: 29.3 pg (ref 26.0–34.0)
MCHC: 32.4 g/dL (ref 30.0–36.0)
MCV: 90.4 fL (ref 80.0–100.0)
Monocytes Absolute: 0.8 10*3/uL (ref 0.1–1.0)
Monocytes Relative: 9 %
Neutro Abs: 6.7 10*3/uL (ref 1.7–7.7)
Neutrophils Relative %: 76 %
Platelets: 226 10*3/uL (ref 150–400)
RBC: 4.98 MIL/uL (ref 3.87–5.11)
RDW: 12.9 % (ref 11.5–15.5)
WBC: 9 10*3/uL (ref 4.0–10.5)
nRBC: 0 % (ref 0.0–0.2)

## 2022-06-28 LAB — BASIC METABOLIC PANEL
Anion gap: 9 (ref 5–15)
BUN: 33 mg/dL — ABNORMAL HIGH (ref 8–23)
CO2: 28 mmol/L (ref 22–32)
Calcium: 9.2 mg/dL (ref 8.9–10.3)
Chloride: 99 mmol/L (ref 98–111)
Creatinine, Ser: 1.22 mg/dL — ABNORMAL HIGH (ref 0.44–1.00)
GFR, Estimated: 45 mL/min — ABNORMAL LOW (ref >60)
Glucose, Bld: 196 mg/dL — ABNORMAL HIGH (ref 70–99)
Potassium: 4.1 mmol/L (ref 3.5–5.1)
Sodium: 136 mmol/L (ref 135–145)

## 2022-06-28 LAB — VITAMIN D 25 HYDROXY (VIT D DEFICIENCY, FRACTURES): Vit D, 25-Hydroxy: 45.99 ng/mL (ref 30–100)

## 2022-06-28 LAB — TYPE AND SCREEN
ABO/RH(D): O POS
Antibody Screen: NEGATIVE

## 2022-06-28 LAB — ABO/RH: ABO/RH(D): O POS

## 2022-06-28 LAB — ALBUMIN: Albumin: 3.6 g/dL (ref 3.5–5.0)

## 2022-06-28 MED ORDER — DOCUSATE SODIUM 100 MG PO CAPS
100.0000 mg | ORAL_CAPSULE | Freq: Two times a day (BID) | ORAL | Status: DC
  Administered 2022-06-28 – 2022-07-02 (×7): 100 mg via ORAL
  Filled 2022-06-28 (×7): qty 1

## 2022-06-28 MED ORDER — EZETIMIBE 10 MG PO TABS
10.0000 mg | ORAL_TABLET | Freq: Every morning | ORAL | Status: DC
  Administered 2022-06-30 – 2022-07-02 (×3): 10 mg via ORAL
  Filled 2022-06-28 (×3): qty 1

## 2022-06-28 MED ORDER — MUPIROCIN 2 % EX OINT
1.0000 | TOPICAL_OINTMENT | Freq: Two times a day (BID) | CUTANEOUS | Status: DC
  Administered 2022-06-28 – 2022-07-02 (×5): 1 via NASAL
  Filled 2022-06-28 (×3): qty 22

## 2022-06-28 MED ORDER — HYDROXYZINE HCL 25 MG PO TABS: 25.0000 mg | ORAL_TABLET | Freq: Three times a day (TID) | ORAL | Status: DC | PRN

## 2022-06-28 MED ORDER — METOPROLOL SUCCINATE ER 100 MG PO TB24
100.0000 mg | ORAL_TABLET | Freq: Every day | ORAL | Status: DC
  Administered 2022-06-29 – 2022-07-02 (×4): 100 mg via ORAL
  Filled 2022-06-28 (×4): qty 1

## 2022-06-28 MED ORDER — INSULIN ASPART 100 UNIT/ML IJ SOLN
0.0000 [IU] | INTRAMUSCULAR | Status: DC
  Administered 2022-06-29: 4 [IU] via SUBCUTANEOUS
  Administered 2022-06-29 – 2022-06-30 (×2): 1 [IU] via SUBCUTANEOUS

## 2022-06-28 MED ORDER — MORPHINE SULFATE (PF) 2 MG/ML IV SOLN
0.5000 mg | INTRAVENOUS | Status: DC | PRN
  Administered 2022-06-28 – 2022-06-29 (×2): 0.5 mg via INTRAVENOUS
  Filled 2022-06-28 (×2): qty 1

## 2022-06-28 MED ORDER — SODIUM CHLORIDE 0.9 % IV SOLN: INTRAVENOUS | Status: DC

## 2022-06-28 MED ORDER — FUROSEMIDE 20 MG PO TABS
20.0000 mg | ORAL_TABLET | Freq: Every day | ORAL | Status: DC
  Administered 2022-06-30 – 2022-07-02 (×3): 20 mg via ORAL
  Filled 2022-06-28 (×4): qty 1

## 2022-06-28 MED ORDER — HYDROCODONE-ACETAMINOPHEN 5-325 MG PO TABS
1.0000 | ORAL_TABLET | Freq: Once | ORAL | Status: AC
  Administered 2022-06-28: 1 via ORAL
  Filled 2022-06-28: qty 1

## 2022-06-28 MED ORDER — SENNOSIDES-DOCUSATE SODIUM 8.6-50 MG PO TABS: 1.0000 | ORAL_TABLET | Freq: Every evening | ORAL | Status: DC | PRN
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
## 2022-06-28 NOTE — ED Triage Notes (Signed)
Pt arrived POV c/o mechanical fall while taking arm full of things into the house and she thinks she tripped. Pt reports impact to R hip. MRI completed today at [facility]. Pt was told to come to ER to be admitted for surgery tomorrow d/t MRI showing a R hip fx. Pt in NAD

## 2022-06-28 NOTE — ED Provider Notes (Signed)
[facility] Provider Note   CSN: [id number] Arrival date & time: 06/28/22  1756     History  Chief Complaint  Patient presents with   [name] is a 77 y.o. female with a past medical history of type 2 diabetes, CVA, NSTEMI and hypertension presenting today with a hip fracture.  She reports that she had a bag in her hand in a jug of sweet tea and got her feet tangled up and landed onto her right hip.  She saw Raliegh Ip who did an MRI and told her she had a fracture.  She was unable to tolerate pain at home and came here for admission for operation.   Fall       Home Medications Prior to Admission medications   Medication Sig Start Date End Date Taking? Authorizing Provider  acetaminophen (TYLENOL) 500 MG tablet Take 1,000 mg by mouth every 6 (six) hours as needed for moderate pain, mild pain or headache.    [provider]  aspirin (ASPIRIN CHILDRENS) 81 MG chewable tablet Chew 1 tablet (81 mg total) by mouth daily. 05/22/21   Adrian Prows, MD  Cholecalciferol (VITAMIN D3 PO) Take 1 tablet by mouth every evening.    [provider]  clonazePAM (KLONOPIN) 0.5 MG tablet Take 1 tablet (0.5 mg total) by mouth 2 (two) times daily as needed for anxiety. Patient taking differently: Take 0.5 mg by mouth at bedtime as needed (sleep). 06/07/21   Arrien, Jimmy Picket, MD  empagliflozin (JARDIANCE) 10 MG TABS tablet Take 1 tablet (10 mg total) by mouth daily. Patient taking differently: Take 10 mg by mouth daily. '20mg'$  in the morning and '5mg'$  in the evening. 06/07/21   Arrien, Jimmy Picket, MD  ezetimibe (ZETIA) 10 MG tablet Take 10 mg by mouth every morning.    [provider]  furosemide (LASIX) 20 MG tablet Take 1 tablet (20 mg total) by mouth daily. 06/07/21 07/12/23  Arrien, Jimmy Picket, MD  hydrOXYzine (ATARAX) 25 MG tablet Take 25 mg by mouth 3 (three) times daily as needed.    [provider]  metFORMIN (GLUCOPHAGE-XR) 500 MG 24 hr tablet Take 1,000 mg by mouth in the morning and at bedtime. 01/25/15   [provider]  metoprolol succinate (TOPROL-XL) 100 MG 24 hr tablet Take 1 tablet (100 mg total) by mouth daily. Take with or immediately following a meal. 11/14/21   Martinique, Peter M, MD  Omega-3 Fatty Acids (FISH OIL) 1000 MG CAPS Take 1,000 mg by mouth every evening.    [provider]  potassium chloride SA (KLOR-CON M) 20 MEQ tablet Take 1 tablet (20 mEq total) by mouth daily. 06/07/21   Arrien, Jimmy Picket, MD  sacubitril-valsartan (ENTRESTO) 97-103 MG Take 1 tablet by mouth 2 (two) times daily. 04/03/22   Martinique, Peter M, MD  sertraline (ZOLOFT) 25 MG tablet Take 1 tablet (25 mg total) by mouth daily. 06/08/21 07/12/22  Arrien, Jimmy Picket, MD  simvastatin (ZOCOR) 40 MG tablet Take 1 tablet (40 mg total) by mouth daily at 6 PM. 05/22/21   Adrian Prows, MD  spironolactone (ALDACTONE) 25 MG tablet Take 0.5 tablets (12.5 mg total) by mouth daily. 08/14/21 08/09/22  Martinique, Peter M, MD  vitamin B-12 (CYANOCOBALAMIN) 1000 MCG tablet Take 1,000 mcg by mouth every evening.    [provider]  zinc gluconate 50 MG tablet Take 50 mg by mouth every evening.  [provider]      Allergies    Vicodin [hydrocodone-acetaminophen], Plavix [clopidogrel], Atorvastatin, Crestor [rosuvastatin calcium], and Lipitor [atorvastatin calcium]    Review of Systems   Review of Systems  Physical Exam Updated Vital Signs BP (!) 151/62 (BP Location: Right Arm)   Pulse 78   Temp 98.1 F (36.7 C) (Oral)   Resp 17   Ht '5\' 3"'$  (1.6 m)   Wt 58.1 kg   SpO2 95%   BMI 22.67 kg/m  Physical Exam Vitals and nursing note reviewed.  Constitutional:      Appearance: Normal appearance.  HENT:     Head: Normocephalic and atraumatic.  Eyes:     General: No scleral icterus.    Conjunctiva/sclera: Conjunctivae normal.  Pulmonary:     Effort: Pulmonary effort is normal.  No respiratory distress.  Musculoskeletal:        General: Tenderness present. No swelling.     Comments: TTP to the right hip.  Strong DP pulses bilaterally  Skin:    Findings: No rash.  Neurological:     Mental Status: She is alert.  Psychiatric:        Mood and Affect: Mood normal.     ED Results / Procedures / Treatments   Labs (all labs ordered are listed, but only abnormal results are displayed) Labs Reviewed  BASIC METABOLIC PANEL - Abnormal; Notable for the following components:      Result Value   Glucose, Bld 196 (*)    BUN 33 (*)    Creatinine, Ser 1.22 (*)    GFR, Estimated 45 (*)    All other components within normal limits  CBC WITH DIFFERENTIAL/PLATELET  URINALYSIS, ROUTINE W REFLEX MICROSCOPIC    EKG None  Radiology No results found.  Procedures Procedures   Medications Ordered in ED Medications - No data to display  ED Course/ Medical Decision Making/ A&P                             Medical Decision Making Amount and/or Complexity of Data Reviewed Labs: ordered.  Risk Prescription drug management. Decision regarding hospitalization.     Additional history:   CLINICAL DATA: Hip fracture. Right posterior hip pain extending into the knee after falling this morning. No previous relevant surgery.  IMPRESSION: 1. Mildly comminuted, nondisplaced intertrochanteric right hip fracture with associated marrow and surrounding soft tissue edema. 2. No other fractures identified. 3. Well-circumscribed cystic appearing lesion along the right pelvic sidewall has enlarged from remote abdominal CT in 2006. 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 No aggressive characteristics are identified by noncontrast imaging. Suggest gynecology follow-up.    Physical Exam: Pertinent physical exam findings include Positive DP pulse and sensation intact  Lab Tests: I ordered, and personally interpreted labs.  The pertinent results  include: Kidney function circa baseline   Medications: I ordered medication including hydrocodone. Reevaluation of the patient after these medicines showed that the patient improved. I have reviewed the patients home medicines and have made adjustments as needed.   Consultations Obtained: I spoke with Dr. Doreatha Martin who is covering for [facility].  He says that he recommends the patient be admitted for PT and pain control.  He would like her to be n.p.o. after midnight and he will talk to her about surgery tomorrow.  He does hope to manage nonoperatively  Plan is PT/ pain control npo after midnight. Hopefully nonop.  MDM/Disposition: This is a 77 year old female who presented today after a fall.  She was diagnosed on outpatient right comminuted but nondisplaced intertrochanteric hip fracture.  Sent here for further evaluation.  She will be admitted, n.p.o. at midnight and seen by orthopedics tomorrow   Final Clinical Impression(s) / ED Diagnoses Final diagnoses:  Closed displaced intertrochanteric fracture of right femur, initial encounter [facility])    Rx / DC Orders ED Discharge Orders     None      Admit to Dr. Emi Holes, Pearl Bents A, PA-C 06/28/22 2059    Hayden Rasmussen, MD 06/29/22 1027

## 2022-06-28 NOTE — ED Notes (Signed)
PG  DR. Percell Miller TO [place] PHONE

## 2022-06-29 ENCOUNTER — Inpatient Hospital Stay (HOSPITAL_COMMUNITY): Payer: Medicare Other | Admitting: Certified Registered Nurse Anesthetist

## 2022-06-29 ENCOUNTER — Other Ambulatory Visit: Payer: Self-pay

## 2022-06-29 ENCOUNTER — Inpatient Hospital Stay (HOSPITAL_COMMUNITY): Payer: Medicare Other

## 2022-06-29 ENCOUNTER — Encounter (HOSPITAL_COMMUNITY)
Admission: EM | Disposition: A | Discharge status: Skilled Nursing Facility | Payer: Self-pay | Source: Home / Self Care | Attending: Internal Medicine

## 2022-06-29 ENCOUNTER — Encounter (HOSPITAL_COMMUNITY): Payer: Self-pay | Admitting: Internal Medicine

## 2022-06-29 DIAGNOSIS — I509 Heart failure, unspecified: Secondary | ICD-10-CM

## 2022-06-29 DIAGNOSIS — I2510 Atherosclerotic heart disease of native coronary artery without angina pectoris: Secondary | ICD-10-CM

## 2022-06-29 DIAGNOSIS — I252 Old myocardial infarction: Secondary | ICD-10-CM | POA: Diagnosis not present

## 2022-06-29 DIAGNOSIS — Z87891 Personal history of nicotine dependence: Secondary | ICD-10-CM

## 2022-06-29 DIAGNOSIS — S72001A Fracture of unspecified part of neck of right femur, initial encounter for closed fracture: Secondary | ICD-10-CM | POA: Diagnosis not present

## 2022-06-29 DIAGNOSIS — I110 Hypertensive heart disease with heart failure: Secondary | ICD-10-CM

## 2022-06-29 DIAGNOSIS — S72141A Displaced intertrochanteric fracture of right femur, initial encounter for closed fracture: Secondary | ICD-10-CM | POA: Diagnosis not present

## 2022-06-29 HISTORY — PX: INTRAMEDULLARY (IM) NAIL INTERTROCHANTERIC: SHX5875

## 2022-06-29 LAB — CBC
HCT: 41.8 % (ref 36.0–46.0)
Hemoglobin: 13.3 g/dL (ref 12.0–15.0)
MCH: 28.9 pg (ref 26.0–34.0)
MCHC: 31.8 g/dL (ref 30.0–36.0)
MCV: 90.9 fL (ref 80.0–100.0)
Platelets: 190 10*3/uL (ref 150–400)
RBC: 4.6 MIL/uL (ref 3.87–5.11)
RDW: 12.9 % (ref 11.5–15.5)
WBC: 7.5 10*3/uL (ref 4.0–10.5)
nRBC: 0 % (ref 0.0–0.2)

## 2022-06-29 LAB — BASIC METABOLIC PANEL
Anion gap: 10 (ref 5–15)
BUN: 26 mg/dL — ABNORMAL HIGH (ref 8–23)
CO2: 26 mmol/L (ref 22–32)
Calcium: 9 mg/dL (ref 8.9–10.3)
Chloride: 104 mmol/L (ref 98–111)
Creatinine, Ser: 0.91 mg/dL (ref 0.44–1.00)
GFR, Estimated: >60 mL/min (ref >60)
Glucose, Bld: 127 mg/dL — ABNORMAL HIGH (ref 70–99)
Potassium: 3.7 mmol/L (ref 3.5–5.1)
Sodium: 140 mmol/L (ref 135–145)

## 2022-06-29 LAB — GLUCOSE, CAPILLARY
Glucose-Capillary: 101 mg/dL — ABNORMAL HIGH (ref 70–99)
Glucose-Capillary: 127 mg/dL — ABNORMAL HIGH (ref 70–99)
Glucose-Capillary: 150 mg/dL — ABNORMAL HIGH (ref 70–99)
Glucose-Capillary: 178 mg/dL — ABNORMAL HIGH (ref 70–99)
Glucose-Capillary: 196 mg/dL — ABNORMAL HIGH (ref 70–99)
Glucose-Capillary: 312 mg/dL — ABNORMAL HIGH (ref 70–99)
Glucose-Capillary: 62 mg/dL — ABNORMAL LOW (ref 70–99)
Glucose-Capillary: 86 mg/dL (ref 70–99)

## 2022-06-29 LAB — URINALYSIS, ROUTINE W REFLEX MICROSCOPIC
Bacteria, UA: NONE SEEN
Bilirubin Urine: NEGATIVE
Glucose, UA: >=500 mg/dL — AB
Hgb urine dipstick: NEGATIVE
Ketones, ur: NEGATIVE mg/dL
Leukocytes,Ua: NEGATIVE
Nitrite: NEGATIVE
Protein, ur: NEGATIVE mg/dL
Specific Gravity, Urine: 1.014 (ref 1.005–1.030)
pH: 5 (ref 5.0–8.0)

## 2022-06-29 LAB — SURGICAL PCR SCREEN
MRSA, PCR: NEGATIVE
Staphylococcus aureus: NEGATIVE

## 2022-06-29 SURGERY — FIXATION, FRACTURE, INTERTROCHANTERIC, WITH INTRAMEDULLARY ROD
Anesthesia: General | Site: Hip | Laterality: Right

## 2022-06-29 MED ORDER — CHLORHEXIDINE GLUCONATE 0.12 % MT SOLN: 15.0000 mL | Freq: Once | OROMUCOSAL | Status: AC

## 2022-06-29 MED ORDER — CHLORHEXIDINE GLUCONATE 0.12 % MT SOLN
OROMUCOSAL | Status: AC
  Administered 2022-06-29: 15 mL via OROMUCOSAL
  Filled 2022-06-29: qty 15

## 2022-06-29 MED ORDER — 0.9 % SODIUM CHLORIDE (POUR BTL) OPTIME
TOPICAL | Status: DC | PRN
  Administered 2022-06-29: 1000 mL

## 2022-06-29 MED ORDER — ONDANSETRON HCL 4 MG/2ML IJ SOLN: 4.0000 mg | Freq: Once | INTRAMUSCULAR | Status: DC | PRN

## 2022-06-29 MED ORDER — FENTANYL CITRATE (PF) 250 MCG/5ML IJ SOLN
INTRAMUSCULAR | Status: AC
  Filled 2022-06-29: qty 5

## 2022-06-29 MED ORDER — CHOLECALCIFEROL 10 MCG (400 UNIT) PO TABS
400.0000 [IU] | ORAL_TABLET | Freq: Every evening | ORAL | Status: DC
  Administered 2022-06-29 – 2022-07-01 (×3): 400 [IU] via ORAL
  Filled 2022-06-29 (×4): qty 1

## 2022-06-29 MED ORDER — ONDANSETRON HCL 4 MG/2ML IJ SOLN: 4.0000 mg | Freq: Four times a day (QID) | INTRAMUSCULAR | Status: DC | PRN

## 2022-06-29 MED ORDER — FENTANYL CITRATE (PF) 100 MCG/2ML IJ SOLN
25.0000 ug | INTRAMUSCULAR | Status: DC | PRN
  Administered 2022-06-29: 50 ug via INTRAVENOUS

## 2022-06-29 MED ORDER — SPIRONOLACTONE 12.5 MG HALF TABLET
12.5000 mg | ORAL_TABLET | Freq: Every day | ORAL | Status: DC
  Administered 2022-06-30 – 2022-07-02 (×3): 12.5 mg via ORAL
  Filled 2022-06-29 (×4): qty 1

## 2022-06-29 MED ORDER — SODIUM CHLORIDE 0.9 % IV SOLN: INTRAVENOUS | Status: DC

## 2022-06-29 MED ORDER — MORPHINE SULFATE (PF) 2 MG/ML IV SOLN: 0.5000 mg | INTRAVENOUS | Status: DC | PRN

## 2022-06-29 MED ORDER — TRANEXAMIC ACID-NACL 1000-0.7 MG/100ML-% IV SOLN
1000.0000 mg | Freq: Once | INTRAVENOUS | Status: AC
  Administered 2022-06-29: 1000 mg via INTRAVENOUS
  Filled 2022-06-29: qty 100

## 2022-06-29 MED ORDER — POLYETHYLENE GLYCOL 3350 17 G PO PACK: 17.0000 g | PACK | Freq: Every day | ORAL | Status: DC | PRN

## 2022-06-29 MED ORDER — ACETAMINOPHEN 10 MG/ML IV SOLN
1000.0000 mg | Freq: Once | INTRAVENOUS | Status: DC | PRN
  Administered 2022-06-29: 1000 mg via INTRAVENOUS

## 2022-06-29 MED ORDER — OMEGA-3-ACID ETHYL ESTERS 1 G PO CAPS
1.0000 g | ORAL_CAPSULE | Freq: Every day | ORAL | Status: DC
  Administered 2022-06-30 – 2022-07-02 (×3): 1 g via ORAL
  Filled 2022-06-29 (×3): qty 1

## 2022-06-29 MED ORDER — ONDANSETRON HCL 4 MG PO TABS: 4.0000 mg | ORAL_TABLET | Freq: Four times a day (QID) | ORAL | Status: DC | PRN

## 2022-06-29 MED ORDER — CEFAZOLIN SODIUM-DEXTROSE 2-4 GM/100ML-% IV SOLN
INTRAVENOUS | Status: AC
  Administered 2022-06-29: 2 g via INTRAVENOUS
  Filled 2022-06-29: qty 100

## 2022-06-29 MED ORDER — ROCURONIUM BROMIDE 10 MG/ML (PF) SYRINGE
PREFILLED_SYRINGE | INTRAVENOUS | Status: AC
  Filled 2022-06-29: qty 10

## 2022-06-29 MED ORDER — PHENYLEPHRINE 80 MCG/ML (10ML) SYRINGE FOR IV PUSH (FOR BLOOD PRESSURE SUPPORT)
PREFILLED_SYRINGE | INTRAVENOUS | Status: AC
  Filled 2022-06-29: qty 10

## 2022-06-29 MED ORDER — EMPAGLIFLOZIN 10 MG PO TABS
10.0000 mg | ORAL_TABLET | Freq: Every day | ORAL | Status: DC
  Administered 2022-06-30 – 2022-07-02 (×3): 10 mg via ORAL
  Filled 2022-06-29 (×4): qty 1

## 2022-06-29 MED ORDER — DEXAMETHASONE SODIUM PHOSPHATE 10 MG/ML IJ SOLN
INTRAMUSCULAR | Status: DC | PRN
  Administered 2022-06-29: 4 mg via INTRAVENOUS

## 2022-06-29 MED ORDER — DEXAMETHASONE SODIUM PHOSPHATE 10 MG/ML IJ SOLN
INTRAMUSCULAR | Status: AC
  Filled 2022-06-29: qty 1

## 2022-06-29 MED ORDER — PREDNISONE 1 MG PO TABS
4.0000 mg | ORAL_TABLET | Freq: Every day | ORAL | Status: DC
  Administered 2022-06-30 – 2022-07-02 (×3): 4 mg via ORAL
  Filled 2022-06-29 (×5): qty 4

## 2022-06-29 MED ORDER — CLONAZEPAM 0.5 MG PO TABS
0.5000 mg | ORAL_TABLET | Freq: Every evening | ORAL | Status: DC | PRN
  Filled 2022-06-29 (×2): qty 1

## 2022-06-29 MED ORDER — ACETAMINOPHEN 10 MG/ML IV SOLN
INTRAVENOUS | Status: AC
  Filled 2022-06-29: qty 100

## 2022-06-29 MED ORDER — CEFAZOLIN SODIUM-DEXTROSE 2-4 GM/100ML-% IV SOLN
2.0000 g | Freq: Three times a day (TID) | INTRAVENOUS | Status: AC
  Administered 2022-06-30 (×2): 2 g via INTRAVENOUS
  Filled 2022-06-29 (×3): qty 100

## 2022-06-29 MED ORDER — INSULIN ASPART 100 UNIT/ML IJ SOLN: 0.0000 [IU] | INTRAMUSCULAR | Status: DC | PRN

## 2022-06-29 MED ORDER — METOCLOPRAMIDE HCL 5 MG/ML IJ SOLN: 5.0000 mg | Freq: Three times a day (TID) | INTRAMUSCULAR | Status: DC | PRN

## 2022-06-29 MED ORDER — AMISULPRIDE (ANTIEMETIC) 5 MG/2ML IV SOLN: 10.0000 mg | Freq: Once | INTRAVENOUS | Status: DC | PRN

## 2022-06-29 MED ORDER — LIDOCAINE 2% (20 MG/ML) 5 ML SYRINGE
INTRAMUSCULAR | Status: AC
  Filled 2022-06-29: qty 5

## 2022-06-29 MED ORDER — OXYCODONE HCL 5 MG PO TABS
5.0000 mg | ORAL_TABLET | ORAL | Status: DC | PRN
  Administered 2022-06-29 – 2022-07-02 (×9): 5 mg via ORAL
  Filled 2022-06-29 (×9): qty 1

## 2022-06-29 MED ORDER — PROPOFOL 10 MG/ML IV BOLUS
INTRAVENOUS | Status: DC | PRN
  Administered 2022-06-29: 100 mg via INTRAVENOUS
  Administered 2022-06-29: 50 mg via INTRAVENOUS

## 2022-06-29 MED ORDER — CEFAZOLIN SODIUM-DEXTROSE 2-4 GM/100ML-% IV SOLN
2.0000 g | Freq: Once | INTRAVENOUS | Status: AC
  Administered 2022-06-29: 2 g via INTRAVENOUS

## 2022-06-29 MED ORDER — ONDANSETRON HCL 4 MG/2ML IJ SOLN
INTRAMUSCULAR | Status: DC | PRN
  Administered 2022-06-29: 4 mg via INTRAVENOUS

## 2022-06-29 MED ORDER — METOCLOPRAMIDE HCL 5 MG PO TABS: 5.0000 mg | ORAL_TABLET | Freq: Three times a day (TID) | ORAL | Status: DC | PRN

## 2022-06-29 MED ORDER — PROPOFOL 10 MG/ML IV BOLUS
INTRAVENOUS | Status: AC
  Filled 2022-06-29: qty 20

## 2022-06-29 MED ORDER — DEXTROSE 50 % IV SOLN: 12.5000 g | INTRAVENOUS | Status: AC

## 2022-06-29 MED ORDER — FENTANYL CITRATE (PF) 100 MCG/2ML IJ SOLN
INTRAMUSCULAR | Status: AC
  Filled 2022-06-29: qty 2

## 2022-06-29 MED ORDER — LIDOCAINE 2% (20 MG/ML) 5 ML SYRINGE
INTRAMUSCULAR | Status: DC | PRN
  Administered 2022-06-29: 60 mg via INTRAVENOUS

## 2022-06-29 MED ORDER — ROCURONIUM BROMIDE 10 MG/ML (PF) SYRINGE
PREFILLED_SYRINGE | INTRAVENOUS | Status: DC | PRN
  Administered 2022-06-29: 40 mg via INTRAVENOUS

## 2022-06-29 MED ORDER — ACETAMINOPHEN 325 MG PO TABS
650.0000 mg | ORAL_TABLET | Freq: Four times a day (QID) | ORAL | Status: DC
  Administered 2022-06-29 – 2022-07-02 (×12): 650 mg via ORAL
  Filled 2022-06-29 (×12): qty 2

## 2022-06-29 MED ORDER — LACTATED RINGERS IV SOLN: INTRAVENOUS | Status: DC

## 2022-06-29 MED ORDER — SUCCINYLCHOLINE CHLORIDE 200 MG/10ML IV SOSY
PREFILLED_SYRINGE | INTRAVENOUS | Status: AC
  Filled 2022-06-29: qty 10

## 2022-06-29 MED ORDER — ONDANSETRON HCL 4 MG/2ML IJ SOLN
INTRAMUSCULAR | Status: AC
  Filled 2022-06-29: qty 2

## 2022-06-29 MED ORDER — PHENYLEPHRINE 80 MCG/ML (10ML) SYRINGE FOR IV PUSH (FOR BLOOD PRESSURE SUPPORT)
PREFILLED_SYRINGE | INTRAVENOUS | Status: DC | PRN
  Administered 2022-06-29 (×2): 160 ug via INTRAVENOUS
  Administered 2022-06-29 (×3): 80 ug via INTRAVENOUS

## 2022-06-29 MED ORDER — DEXTROSE 50 % IV SOLN
INTRAVENOUS | Status: AC
  Filled 2022-06-29: qty 50

## 2022-06-29 MED ORDER — SACUBITRIL-VALSARTAN 97-103 MG PO TABS
1.0000 | ORAL_TABLET | Freq: Two times a day (BID) | ORAL | Status: DC
  Administered 2022-06-29 – 2022-07-02 (×6): 1 via ORAL
  Filled 2022-06-29 (×9): qty 1

## 2022-06-29 MED ORDER — FENTANYL CITRATE (PF) 250 MCG/5ML IJ SOLN
INTRAMUSCULAR | Status: DC | PRN
  Administered 2022-06-29: 50 ug via INTRAVENOUS
  Administered 2022-06-29 (×2): 25 ug via INTRAVENOUS

## 2022-06-29 MED ORDER — ENOXAPARIN SODIUM 40 MG/0.4ML IJ SOSY
40.0000 mg | PREFILLED_SYRINGE | INTRAMUSCULAR | Status: DC
  Administered 2022-06-30 – 2022-07-02 (×3): 40 mg via SUBCUTANEOUS
  Filled 2022-06-29 (×3): qty 0.4

## 2022-06-29 MED ORDER — ORAL CARE MOUTH RINSE: 15.0000 mL | Freq: Once | OROMUCOSAL | Status: AC

## 2022-06-29 MED ORDER — SUGAMMADEX SODIUM 200 MG/2ML IV SOLN
INTRAVENOUS | Status: DC | PRN
  Administered 2022-06-29: 125 mg via INTRAVENOUS

## 2022-06-29 SURGICAL SUPPLY — 46 items
BAG COUNTER SPONGE SURGICOUNT (BAG) IMPLANT
BIT DRILL INTERTAN LAG SCREW (BIT) IMPLANT
BIT DRILL LONG 4.0 (BIT) IMPLANT
BRUSH SCRUB EZ PLAIN DRY (MISCELLANEOUS) ×2 IMPLANT
CHLORAPREP W/TINT 26 (MISCELLANEOUS) ×1 IMPLANT
COVER PERINEAL POST (MISCELLANEOUS) ×1 IMPLANT
COVER SURGICAL LIGHT HANDLE (MISCELLANEOUS) ×1 IMPLANT
DERMABOND ADVANCED .7 DNX12 (GAUZE/BANDAGES/DRESSINGS) ×1 IMPLANT
DRAPE C-ARM 35X43 STRL (DRAPES) ×1 IMPLANT
DRAPE IMP U-DRAPE 54X76 (DRAPES) ×2 IMPLANT
DRAPE INCISE IOBAN 66X45 STRL (DRAPES) ×1 IMPLANT
DRAPE STERI IOBAN 125X83 (DRAPES) ×1 IMPLANT
DRAPE SURG 17X23 STRL (DRAPES) ×2 IMPLANT
DRAPE U-SHAPE 47X51 STRL (DRAPES) ×1 IMPLANT
DRESSING MEPILEX FLEX 4X4 (GAUZE/BANDAGES/DRESSINGS) IMPLANT
DRILL BIT LONG 4.0 (BIT) ×1
DRSG MEPILEX BORDER 4X4 (GAUZE/BANDAGES/DRESSINGS) ×1 IMPLANT
DRSG MEPILEX BORDER 4X8 (GAUZE/BANDAGES/DRESSINGS) ×1 IMPLANT
DRSG MEPILEX FLEX 4X4 (GAUZE/BANDAGES/DRESSINGS) ×2
ELECT REM PT RETURN 9FT ADLT (ELECTROSURGICAL) ×1
ELECTRODE REM PT RTRN 9FT ADLT (ELECTROSURGICAL) ×1 IMPLANT
GLOVE BIO SURGEON STRL SZ 6.5 (GLOVE) ×3 IMPLANT
GLOVE BIO SURGEON STRL SZ7.5 (GLOVE) ×4 IMPLANT
GLOVE BIOGEL PI IND STRL 6.5 (GLOVE) ×1 IMPLANT
GLOVE BIOGEL PI IND STRL 7.5 (GLOVE) ×1 IMPLANT
GOWN STRL REUS W/ TWL LRG LVL3 (GOWN DISPOSABLE) ×1 IMPLANT
GOWN STRL REUS W/TWL LRG LVL3 (GOWN DISPOSABLE) ×1
GUIDE PIN 3.2X343 (PIN) ×2
GUIDE PIN 3.2X343MM (PIN) ×2
KIT BASIN OR (CUSTOM PROCEDURE TRAY) ×1 IMPLANT
KIT TURNOVER KIT B (KITS) ×1 IMPLANT
MANIFOLD NEPTUNE II (INSTRUMENTS) ×1 IMPLANT
NAIL INTERTAN 10X18 130D 10S (Nail) IMPLANT
NS IRRIG 1000ML POUR BTL (IV SOLUTION) ×1 IMPLANT
PACK GENERAL/GYN (CUSTOM PROCEDURE TRAY) ×1 IMPLANT
PAD ARMBOARD 7.5X6 YLW CONV (MISCELLANEOUS) ×2 IMPLANT
PIN GUIDE 3.2X343MM (PIN) IMPLANT
SCREW LAG COMPR KIT 85/80 (Screw) IMPLANT
SCREW TRIGEN LOW PROF 5.0X32.5 (Screw) IMPLANT
SUT MNCRL AB 3-0 PS2 18 (SUTURE) ×1 IMPLANT
SUT VIC AB 0 CT1 27 (SUTURE)
SUT VIC AB 0 CT1 27XBRD ANBCTR (SUTURE) IMPLANT
SUT VIC AB 2-0 CT1 27 (SUTURE) ×2
SUT VIC AB 2-0 CT1 TAPERPNT 27 (SUTURE) ×2 IMPLANT
TOWEL GREEN STERILE (TOWEL DISPOSABLE) ×2 IMPLANT
WATER STERILE IRR 1000ML POUR (IV SOLUTION) ×1 IMPLANT

## 2022-06-29 NOTE — Op Note (Signed)
Orthopaedic Surgery Operative Note (CSN: [id number] ) Date of Surgery: 06/29/2022  Admit Date: 06/28/2022   Diagnoses: Pre-Op Diagnoses: Right intertrochanteric femur fracture  Post-Op Diagnosis: Same  Procedures: CPT 27245-Cephalomedullary nailing of right intertrochanteric femur fracture  Surgeons : Primary: Shona Needles, MD  Assistant: None  Location: OR 5   Anesthesia:General   Antibiotics: Ancef 2g preop   Tourniquet time: Nones    Estimated Blood Loss: 50 mL  Complications:* No complications entered in OR log *   Specimens:* No specimens in log *   Implants: Implant Name Type Inv. Item Serial No. Manufacturer Lot No. LRB No. Used Action  NAIL INTERTAN 10X18 130D 10S - [id number] Nail NAIL INTERTAN 10X18 130D 10S  [employer] [id number] Right 1 Implanted  SCREW LAG COMPR KIT 85/80 - [id number] Screw SCREW LAG COMPR KIT 85/80  [employer] [id number] Right 1 Implanted  SCREW TRIGEN LOW PROF 5.0X32.5 [id number] Screw SCREW TRIGEN LOW PROF 5.0X32.5  [employer] [id number] Right 1 Implanted     Indications for Surgery: 77 year old female who sustained a ground-level fall and was found to have a nondisplaced intertrochanteric femur fracture on MRI.  Due to the immobility and significant pain and potential displacement I recommended proceeding with cephalomedullary nailing of the right femur.  Risks and benefits were discussed with the patient.  Risks include but not limited to bleeding, infection, malunion, nonunion, hardware failure, hardware rotation, nerve or blood vessel injury, DVT, even the possibility anesthetic complications.  She agreed to proceed with surgery and consent was obtained.  Operative Findings: Cephalomedullary nailing of right intertrochanteric femur fracture using [employer] Inter[employer]AN 10 x 180 mm nail with a 85 mm lag screw and 80 mm compression screw.  Procedure: The patient was identified in the  preoperative holding area. Consent was confirmed with the patient and their family and all questions were answered. The operative extremity was marked after confirmation with the patient. she was then brought back to the operating room by our anesthesia colleagues.  She was placed under general anesthetic and carefully transferred over to the [facility] table.  All bony prominences were well-padded.  Fluoroscopic imaging showed the right hip.  Right lower extremity was then prepped and draped in usual sterile fashion.  A timeout was performed to verify the patient, the procedure, and the extremity.  Preoperative antibiotics were dosed.  Small incision proximal to the greater trochanter was made and carried down through skin and subcutaneous tissue.  A threaded guidewire was placed at the tip of the greater trochanter and advanced into the proximal metaphysis.  An entry reamer was used to enter the medullary canal.  I then placed a 10 x 180 mm nail down the center of the canal attached to the targeting arm.  I used the targeting arm to direct a threaded guidewire into the head/neck segment.  I confirmed adequate tip apex distance with fluoroscopic imaging.  I then measured the length.  I then drilled and placed antirotation bar for the compression screw.  I then drilled the path for the lag screw and placed the lag screw.  I then placed a compression screw and statically locked the 2 to the nail.  I then used the targeting arm to place a distal interlocking screw.  Final fluoroscopic imaging was obtained after the targeting arm was removed.  The incisions were irrigated and closed with 2-0 Vicryl and Monocryl.  Sterile dressings were applied.  The patient was then  awoke from anesthesia and taken to the PACU in stable condition.  Post Op Plan/Instructions: Patient will be weightbearing as tolerated to the right lower extremity.  She will receive postoperative Ancef.  She will receive Lovenox for DVT prophylaxis and  discharged on aspirin 81 mg.  Will have her mobilize with physical and Occupational Therapy.  I was present and performed the entire surgery.  Katha Hamming, MD [facility]

## 2022-06-29 NOTE — Progress Notes (Signed)
yesterday

## 2022-06-29 NOTE — Plan of Care (Signed)
  Problem: Coping: Goal: Ability to adjust to condition or change in health will improve Outcome: Progressing   Problem: Fluid Volume: Goal: Ability to maintain a balanced intake and output will improve Outcome: Progressing   Problem: Skin Integrity: Goal: Risk for impaired skin integrity will decrease Outcome: Progressing   Problem: Education: Goal: Knowledge of General Education information will improve Description: Including pain rating scale, medication(s)/side effects and non-pharmacologic comfort measures Outcome: Progressing

## 2022-06-29 NOTE — Progress Notes (Addendum)
PROGRESS NOTE    [name]  [id number] DOB: 05-23-1945 DOA: 06/28/2022 PCP: Harlan Stains, MD    Brief Narrative:   [name] is a 77 y.o. female with past medical history significant for chronic systolic congestive heart failure, Takotsubo cardiomyopathy, CAD s/p NSTEMI, HTN, HLD, PMR on low-dose prednisone, type 2 diabetes mellitus who presented to [facility] ED on 3/1 with complaints of right hip pain following mechanical fall at home.  Patient was initially taken to orthopedic clinic, [name] in which she had an MRI completed which noted a right comminuted intertrochanteric hip fracture.  Patient was referred to the ED for further evaluation.  Patient denied any loss of consciousness,.  No other pain otherwise.  Further denies shortness of breath, no chest pain, no nausea/vomiting/diarrhea, no cough, no fever/chills.  In the ED, temperature 98.1 F, HR 78, RR 17, BP 151/62, SpO2 95% room air.  WBC 9.0, hemoglobin 14.6, platelets 226.  Sodium 136, potassium 4.1, chloride 99, CO2 28, glucose 196, BUN 33, creatinine 1.22.  Urinalysis unrevealing.  Chest x-ray with no acute cardiopulmonary disease process.  CLINICAL DATA: Hip fracture. Right posterior hip pain extending into the knee after falling this morning. No previous relevant surgery.  IMPRESSION: 1. Mildly comminuted, nondisplaced intertrochanteric right hip fracture with associated marrow and surrounding soft tissue edema. 2. No other fractures identified. 3. Well-circumscribed cystic appearing lesion along the right pelvic sidewall has enlarged from remote abdominal CT in 2006. 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 No aggressive characteristics are identified by noncontrast imaging. Suggest gynecology follow-up.   EDP consulted orthopedics.  TRH consulted for admission for further evaluation management of acute right hip fracture.   Assessment & Plan:   Right intertrochanteric enteric hip  fracture; mildly comminuted Patient presenting to ED with right hip pain following mechanical fall at home.  Outpatient MRI notable for a mildly comminuted, nondisplaced intertrochanteric right hip fracture with surrounding soft tissue edema. -- Orthopedics following, appreciate assistance -- Vitamin D 25-hydroxy level: 45.99 -- On low-dose prednisone outpatient, no need for stress dose steroids in the perioperative setting -- Morphine 0.5 mg IV q2h PRN severe pain -- N.p.o., bedrest, pending operative management for right hip fracture this afternoon  Chronic systolic/diastolic congestive heart failure, compensated Takotsubo cardiomyopathy Nonobstructive CAD Essential hypertension TTE 09/11/2021 with LVEF 55-60% (previous 35-40% 06/05/2021), grade 1 diastolic dysfunction, LV with no regional wall motion abnormalities, RV systolic function normal, trivial MR, no aortic stenosis, IVC normal in size.  Follows with cardiology outpatient, Dr. Martinique. -- Metoprolol succinate 100 mg p.o. daily -- Entresto 97-103 mg p.o. twice daily -- Spironolactone 12.5 mg p.o. daily -- Furosemide 20 mg p.o. daily -- Jardiance 10 mg p.o. daily  Hyperlipidemia -- Zetia 10 mg p.o. daily  Type 2 diabetes mellitus Non-insulin-dependent at baseline.  Hemoglobin A1c 6.027 2023, well-controlled. -- Continue Jardiance 10 mg p.o. daily -- Hold home metformin -- Very sensitive SSI for coverage -- CBGs every 4 hours while NPO  PMR -- Prednisone 4 mg p.o. daily -- Outpatient follow-up with rheumatology  Insomnia --Clonazepam 0.5 mg p.o. nightly as needed   DVT prophylaxis: SCDs Start: 06/28/22 2154    Code Status: Full Code Family Communication: Updated patient's husband present at bedside this morning  Disposition Plan:  Level of care: Telemetry Medical Status is: Inpatient Remains inpatient appropriate because: Pending operative management for right hip fracture    Consultants:  Orthopedics, Dr.  Doreatha Martin  Procedures:  None  Antimicrobials:  None   Subjective: Patient seen examined bedside, resting comfortably.  Lying in bed.  Husband present at bedside.  Pain controlled while at rest.  Awaiting for operative management for her hip fracture this afternoon.  No other questions or concerns at this time.  Denies headache, no dizziness, no visual changes, no chest pain, no palpitations, no shortness of breath, no fever/chills/night sweats, no nausea/vomiting/diarrhea, no focal weakness, no fatigue, no paresthesias.  No acute events overnight per nursing staff.  Objective: Vitals:   06/28/22 2208 06/29/22 0211 06/29/22 0545 06/29/22 0845  BP: (!) 143/68 (!) 109/52 (!) 111/59 (!) 122/57  Pulse: 70 74 67 68  Resp: '17 15 18 18  '$ Temp: 97.9 F (36.6 C) 97.7 F (36.5 C) 98.4 F (36.9 C) 98.3 F (36.8 C)  TempSrc: Oral Oral Oral Oral  SpO2: 94% 97% 93% 97%  Weight:      Height:        Intake/Output Summary (Last 24 hours) at 06/29/2022 1030 Last data filed at 06/29/2022 0900 Gross per 24 hour  Intake 0 ml  Output 800 ml  Net -800 ml   Filed Weights   06/28/22 1822 06/28/22 2200  Weight: 58.1 kg 59.9 kg    Examination:  Physical Exam: GEN: NAD, alert and oriented x 3, wd/wn HEENT: NCAT, PERRL, EOMI, sclera clear, MMM PULM: CTAB w/o wheezes/crackles, normal respiratory effort, on room air CV: RRR w/o M/G/R GI: abd soft, NTND, NABS, no R/G/M MSK: no peripheral edema, decreased range of motion to right lower extremity due to pain, neurovascular intact, moves all other extremities independently with preserved strength NEURO: No focal neurological deficits PSYCH: normal mood/affect Integumentary: dry/intact, no rashes or wounds    Data Reviewed: I have personally reviewed following labs and imaging studies  CBC: Recent Labs  Lab 06/28/22 1926 06/29/22 0450  WBC 9.0 7.5  NEUTROABS 6.7  --   HGB 14.6 13.3  HCT 45.0 41.8  MCV 90.4 90.9  PLT 226 [id number]   Basic Metabolic  Panel: Recent Labs  Lab 06/28/22 1926 06/29/22 0450  NA 136 140  K 4.1 3.7  CL 99 104  CO2 28 26  GLUCOSE 196* 127*  BUN 33* 26*  CREATININE 1.22* 0.91  CALCIUM 9.2 9.0   GFR: Estimated Creatinine Clearance: 42.1 mL/min (by C-G formula based on SCr of 0.91 mg/dL). Liver Function Tests: Recent Labs  Lab 06/28/22 2235  ALBUMIN 3.6   No results for input(s): "LIPASE", "AMYLASE" in the last 168 hours. No results for input(s): "AMMONIA" in the last 168 hours. Coagulation Profile: No results for input(s): "INR", "PROTIME" in the last 168 hours. Cardiac Enzymes: No results for input(s): "CKTOTAL", "CKMB", "CKMBINDEX", "TROPONINI" in the last 168 hours. BNP (last 3 results) No results for input(s): "PROBNP" in the last 8760 hours. HbA1C: No results for input(s): "HGBA1C" in the last 72 hours. CBG: Recent Labs  Lab 06/29/22 0012 06/29/22 0345 06/29/22 0849  GLUCAP 101* 127* 86   Lipid Profile: No results for input(s): "CHOL", "HDL", "LDLCALC", "TRIG", "CHOLHDL", "LDLDIRECT" in the last 72 hours. Thyroid Function Tests: No results for input(s): "TSH", "T4TOTAL", "FREET4", "T3FREE", "THYROIDAB" in the last 72 hours. Anemia Panel: No results for input(s): "VITAMINB12", "FOLATE", "FERRITIN", "TIBC", "IRON", "RETICCTPCT" in the last 72 hours. Sepsis Labs: No results for input(s): "PROCALCITON", "LATICACIDVEN" in the last 168 hours.  Recent Results (from the past 240 hour(s))  Surgical PCR screen     Status: None   Collection Time: 06/28/22 10:34 PM  Specimen: Nasal Mucosa; Nasal Swab  Result Value Ref Range Status   MRSA, PCR NEGATIVE NEGATIVE Final   Staphylococcus aureus NEGATIVE NEGATIVE Final    Comment: (NOTE) The Xpert SA Assay (FDA approved for NASAL specimens in patients 55 years of age and older), is one component of a comprehensive surveillance program. It is not intended to diagnose infection nor to guide or monitor treatment. Performed at [facility], [place] [address], [place], [place] [address]          Radiology Studies: Chest Portable 1 View  Result Date: 06/28/2022 CLINICAL DATA:  Preoperative evaluation for upcoming right hip surgery EXAM: PORTABLE CHEST 1 VIEW COMPARISON:  08/26/2018 FINDINGS: Cardiac shadow is within normal limits. Aortic calcifications are seen. Lungs are well aerated bilaterally. No focal infiltrate or effusion is noted. Postsurgical changes in the cervical spine are seen. IMPRESSION: No acute abnormality noted. Electronically Signed   By: Inez Catalina M.D.   On: 06/28/2022 22:35        Scheduled Meds:  cholecalciferol  400 Units Oral QPM   docusate sodium  100 mg Oral BID   empagliflozin  10 mg Oral Daily   ezetimibe  10 mg Oral q morning   furosemide  20 mg Oral Daily   insulin aspart  0-6 Units Subcutaneous Q4H   metoprolol succinate  100 mg Oral Daily   mupirocin ointment  1 Application Nasal BID   omega-3 acid ethyl esters  1 g Oral Daily   predniSONE  4 mg Oral Q breakfast   sacubitril-valsartan  1 tablet Oral BID   spironolactone  12.5 mg Oral Daily   Continuous Infusions:  sodium chloride 50 mL/hr at 06/28/22 2255     LOS: 1 day    Time spent: 51 minutes spent on chart review, discussion with nursing staff, consultants, updating family and interview/physical exam; more than 50% of that time was spent in counseling and/or coordination of care.    Zekiel Torian J [place] DO [facility] Available via Epic secure chat 7am-7pm After these hours, please refer to coverage provider listed on [email or web address] 06/29/2022, 10:30 AM

## 2022-06-29 NOTE — Anesthesia Preprocedure Evaluation (Signed)
Anesthesia Evaluation  Patient identified by MRN, date of birth, ID band Patient awake    Reviewed: Allergy & Precautions, NPO status , Patient's Chart, lab work & pertinent test results  Airway Mallampati: II  TM Distance: >3 FB Neck ROM: Full    Dental no notable dental hx.    Pulmonary former smoker   Pulmonary exam normal        Cardiovascular hypertension, Pt. on home beta blockers and Pt. on medications + CAD, + Past MI and +CHF  Normal cardiovascular exam  ECHO: 1. Left ventricular ejection fraction, by estimation, is 55 to 60%.   Neuro/Psych   Anxiety     TIA   GI/Hepatic negative GI ROS, Neg liver ROS,,,  Endo/Other  diabetes, Oral Hypoglycemic Agents    Renal/GU negative Renal ROS     Musculoskeletal  (+) Arthritis ,  Lumbar spine surgery x 3   Abdominal   Peds  Hematology negative hematology ROS (+)   Anesthesia Other Findings Right intertrochanteric femur  Reproductive/Obstetrics                              Anesthesia Physical Anesthesia Plan  ASA: 3  Anesthesia Plan: General   Post-op Pain Management:    Induction: Intravenous  PONV Risk Score and Plan:   Airway Management Planned:   Additional Equipment:   Intra-op Plan:   Post-operative Plan: Extubation in OR  Informed Consent: I have reviewed the patients History and Physical, chart, labs and discussed the procedure including the risks, benefits and alternatives for the proposed anesthesia with the patient or authorized representative who has indicated his/her understanding and acceptance.     Dental advisory given  Plan Discussed with: CRNA  Anesthesia Plan Comments:          Anesthesia Quick Evaluation

## 2022-06-29 NOTE — Transfer of Care (Signed)
Immediate Anesthesia Transfer of Care Note  Patient: [name]  Procedure(s) Performed: INTRAMEDULLARY (IM) NAIL INTERTROCHANTERIC (Right: Hip)  Patient Location: PACU  Anesthesia Type:General  Level of Consciousness: awake, alert , oriented, and patient cooperative  Airway & Oxygen Therapy: Patient Spontanous Breathing and Patient connected to nasal cannula oxygen  Post-op Assessment: Report given to RN and Post -op Vital signs reviewed and stable  Post vital signs: Reviewed and stable  Last Vitals:  Vitals Value Taken Time  BP    Temp    Pulse    Resp    SpO2      Last Pain:  Vitals:   06/29/22 1129  TempSrc: Oral  PainSc:       Patients Stated Pain Goal: 2 ([id number])  Complications: No notable events documented.

## 2022-06-29 NOTE — Anesthesia Postprocedure Evaluation (Signed)
Anesthesia Post Note  Patient: [name]  Procedure(s) Performed: INTRAMEDULLARY (IM) NAIL INTERTROCHANTERIC (Right: Hip)     Patient location during evaluation: PACU Anesthesia Type: General Level of consciousness: awake Pain management: pain level controlled Vital Signs Assessment: post-procedure vital signs reviewed and stable Respiratory status: spontaneous breathing, nonlabored ventilation and respiratory function stable Cardiovascular status: blood pressure returned to baseline and stable Postop Assessment: no apparent nausea or vomiting Anesthetic complications: no   No notable events documented.  Last Vitals:  Vitals:   06/29/22 1405 06/29/22 1420  BP: (!) 163/72 (!) 158/58  Pulse: 80 80  Resp: 11 17  Temp:  36.8 C  SpO2: 92% 98%    Last Pain:  Vitals:   06/29/22 1515  TempSrc:   PainSc: 8                  Gregroy Dombkowski P Loralye Loberg
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
## 2022-06-29 NOTE — Anesthesia Procedure Notes (Signed)
Procedure Name: Intubation Date/Time: 06/29/2022 12:55 PM  Performed by: Sammie Bench, CRNAPre-anesthesia Checklist: Patient identified, Emergency Drugs available, Suction available and Patient being monitored Patient Re-evaluated:Patient Re-evaluated prior to induction Oxygen Delivery Method: Circle System Utilized Preoxygenation: Pre-oxygenation with 100% oxygen Induction Type: IV induction Ventilation: Mask ventilation without difficulty Laryngoscope Size: Glidescope and 3 Grade View: Grade II Tube type: Oral Tube size: 7.0 mm Number of attempts: 2 (1st look with MAC3, only epiglottis visualized. Swithched to glidescope. for second look. Patient with short thyromental distance.) Airway Equipment and Method: Stylet and Oral airway Placement Confirmation: ETT inserted through vocal cords under direct vision, positive ETCO2 and breath sounds checked- equal and bilateral Secured at: 21 cm Tube secured with: Tape Dental Injury: Teeth and Oropharynx as per pre-operative assessment

## 2022-06-29 NOTE — Progress Notes (Signed)
Initial Nutrition Assessment  DOCUMENTATION CODES:   Not applicable  INTERVENTION:  (Once diet advances) - Add Ensure Enlive po BID, each supplement provides 350 kcal and 20 grams of protein.  - Add MVI q day.   NUTRITION DIAGNOSIS:   Increased nutrient needs related to hip fracture as evidenced by estimated needs.  GOAL:   Patient will meet greater than or equal to 90% of their needs  MONITOR:   PO intake, Diet advancement  REASON FOR ASSESSMENT:   Consult Hip fracture protocol  ASSESSMENT:   77 y.o. female admits related to right hip pain. PMH includes: arthritis, CAD, HLD, HTN, T2DM. Pt is currently receiving medical management related to closed right hip fracture.  Meds reviewed: Vit D3, colace, lasix, sliding scale insulin, prednisone, aldactone. Labs reviewed.   Pt is currently NPO for surgery. Remote assessment and unable to reach pt room. No significant wt loss per record. RD will plan to add supplements once diet advances. Will continue to monitor.   NUTRITION - FOCUSED PHYSICAL EXAM:  Unable to assess, attempt at f/u.   Diet Order:   Diet Order             Diet NPO time specified Except for: Sips with Meds  Diet effective now                   EDUCATION NEEDS:   Not appropriate for education at this time  Skin:  Skin Assessment: Reviewed RN Assessment  Last BM:  06/28/22  Height:   Ht Readings from Last 1 Encounters:  06/28/22 '5\' 3"'$  (1.6 m)    Weight:   Wt Readings from Last 1 Encounters:  06/28/22 59.9 kg    Ideal Body Weight:     BMI:  Body mass index is 23.39 kg/m.  Estimated Nutritional Needs:   Kcal:  1800-2100 kcals  Protein:  90-105 gm  Fluid:  >/= 1.8 L  Thalia Bloodgood, RD, LDN, CNSC.

## 2022-06-29 NOTE — Progress Notes (Signed)
PT Cancellation Note  Patient Details Name: [name] MRN: [id number] DOB: 30-Dec-1945   Cancelled Treatment:    Reason Eval/Treat Not Completed: Patient at procedure or test/unavailable Pt taken to surgery. Will continue to follow.  Candie Mile, PT, DPT Physical Therapist Acute Rehabilitation Services [facility]  06/29/2022, 11:53 AM

## 2022-06-29 NOTE — Progress Notes (Signed)
OT Cancellation Note  Patient Details Name: [name] MRN: [id number] DOB: [birth year]   Cancelled Treatment:    Reason Eval/Treat Not Completed: Patient at procedure or test/ unavailable. Will return s/p surgery.   Elder Cyphers, OTR/L [facility] Acute Rehabilitation Office: [phone]   Magnus Ivan 06/29/2022, 11:59 AM

## 2022-06-30 DIAGNOSIS — S72001A Fracture of unspecified part of neck of right femur, initial encounter for closed fracture: Secondary | ICD-10-CM | POA: Diagnosis not present

## 2022-06-30 LAB — BASIC METABOLIC PANEL
Anion gap: 9 (ref 5–15)
BUN: 21 mg/dL (ref 8–23)
CO2: 24 mmol/L (ref 22–32)
Calcium: 8.4 mg/dL — ABNORMAL LOW (ref 8.9–10.3)
Chloride: 106 mmol/L (ref 98–111)
Creatinine, Ser: 1.12 mg/dL — ABNORMAL HIGH (ref 0.44–1.00)
GFR, Estimated: 50 mL/min — ABNORMAL LOW (ref >60)
Glucose, Bld: 135 mg/dL — ABNORMAL HIGH (ref 70–99)
Potassium: 3.8 mmol/L (ref 3.5–5.1)
Sodium: 139 mmol/L (ref 135–145)

## 2022-06-30 LAB — CBC
HCT: 40.5 % (ref 36.0–46.0)
Hemoglobin: 12.8 g/dL (ref 12.0–15.0)
MCH: 28.8 pg (ref 26.0–34.0)
MCHC: 31.6 g/dL (ref 30.0–36.0)
MCV: 91.2 fL (ref 80.0–100.0)
Platelets: 187 10*3/uL (ref 150–400)
RBC: 4.44 MIL/uL (ref 3.87–5.11)
RDW: 12.8 % (ref 11.5–15.5)
WBC: 8.2 10*3/uL (ref 4.0–10.5)
nRBC: 0 % (ref 0.0–0.2)

## 2022-06-30 LAB — GLUCOSE, CAPILLARY
Glucose-Capillary: 126 mg/dL — ABNORMAL HIGH (ref 70–99)
Glucose-Capillary: 128 mg/dL — ABNORMAL HIGH (ref 70–99)
Glucose-Capillary: 139 mg/dL — ABNORMAL HIGH (ref 70–99)
Glucose-Capillary: 179 mg/dL — ABNORMAL HIGH (ref 70–99)
Glucose-Capillary: 197 mg/dL — ABNORMAL HIGH (ref 70–99)
Glucose-Capillary: 198 mg/dL — ABNORMAL HIGH (ref 70–99)

## 2022-06-30 LAB — MAGNESIUM: Magnesium: 2.1 mg/dL (ref 1.7–2.4)

## 2022-06-30 MED ORDER — INSULIN ASPART 100 UNIT/ML IJ SOLN
0.0000 [IU] | Freq: Three times a day (TID) | INTRAMUSCULAR | Status: DC
  Administered 2022-06-30 – 2022-07-01 (×3): 1 [IU] via SUBCUTANEOUS
  Administered 2022-07-01: 2 [IU] via SUBCUTANEOUS
  Administered 2022-07-01 – 2022-07-02 (×2): 1 [IU] via SUBCUTANEOUS

## 2022-06-30 NOTE — Progress Notes (Signed)
Orthopaedic Trauma Progress Note  SUBJECTIVE: Doing okay this morning.  Having some pain in the right leg at rest.  Pain medications helping to ease this off.  Having difficulty lifting her leg.  We discussed this is normal and to be expected following her injury and surgery.  Asking if she can get out of the bed today and wondering when therapies are coming.  Denies any numbness or tingling throughout the right lower extremity.  No chest pain. No SOB. No nausea/vomiting. No other complaints.   OBJECTIVE:  Vitals:   06/30/22 0018 06/30/22 0451  BP: (!) 106/56 (!) 121/50  Pulse: 73 68  Resp: 18 18  Temp: 97.6 F (36.4 C) 98.5 F (36.9 C)  SpO2: (!) 87% 98%    General: Sitting up in bed, no acute distress Respiratory: No increased work of breathing.  Right lower extremity: Dressings clean, dry, intact.  Does have some general soreness but no significant tenderness over the hip or throughout the thigh.  Ankle DF/PF intact.  Tolerates gentle knee range of motion.  Endorses sensation over all aspects of the foot.  Skin warm and dry.  Compartment soft compressible.  2+ DP pulse  IMAGING: Stable post op imaging.   LABS:  Results for orders placed or performed during the hospital encounter of 06/28/22 (from the past 24 hour(s))  Glucose, capillary     Status: None   Collection Time: 06/29/22  8:49 AM  Result Value Ref Range   Glucose-Capillary 86 70 - 99 mg/dL   Comment 1 Notify RN   Glucose, capillary     Status: Abnormal   Collection Time: 06/29/22 12:36 PM  Result Value Ref Range   Glucose-Capillary 62 (L) 70 - 99 mg/dL   Comment 1 Document in Chart   Glucose, capillary     Status: Abnormal   Collection Time: 06/29/22  1:14 PM  Result Value Ref Range   Glucose-Capillary 150 (H) 70 - 99 mg/dL  Glucose, capillary     Status: Abnormal   Collection Time: 06/29/22  4:57 PM  Result Value Ref Range   Glucose-Capillary 178 (H) 70 - 99 mg/dL  Glucose, capillary     Status: Abnormal    Collection Time: 06/29/22  8:06 PM  Result Value Ref Range   Glucose-Capillary 312 (H) 70 - 99 mg/dL  Glucose, capillary     Status: Abnormal   Collection Time: 06/29/22 11:48 PM  Result Value Ref Range   Glucose-Capillary 196 (H) 70 - 99 mg/dL  Glucose, capillary     Status: Abnormal   Collection Time: 06/30/22 12:20 AM  Result Value Ref Range   Glucose-Capillary 198 (H) 70 - 99 mg/dL  CBC     Status: None   Collection Time: 06/30/22  4:48 AM  Result Value Ref Range   WBC 8.2 4.0 - 10.5 K/uL   RBC 4.44 3.87 - 5.11 MIL/uL   Hemoglobin 12.8 12.0 - 15.0 g/dL   HCT 40.5 36.0 - 46.0 %   MCV 91.2 80.0 - 100.0 fL   MCH 28.8 26.0 - 34.0 pg   MCHC 31.6 30.0 - 36.0 g/dL   RDW 12.8 11.5 - 15.5 %   Platelets 187 150 - 400 K/uL   nRBC 0.0 0.0 - 0.2 %  Basic metabolic panel     Status: Abnormal   Collection Time: 06/30/22  4:48 AM  Result Value Ref Range   Sodium 139 135 - 145 mmol/L   Potassium 3.8 3.5 - 5.1 mmol/L  Chloride 106 98 - 111 mmol/L   CO2 24 22 - 32 mmol/L   Glucose, Bld 135 (H) 70 - 99 mg/dL   BUN 21 8 - 23 mg/dL   Creatinine, Ser 1.12 (H) 0.44 - 1.00 mg/dL   Calcium 8.4 (L) 8.9 - 10.3 mg/dL   GFR, Estimated 50 (L) >60 mL/min   Anion gap 9 5 - 15  Magnesium     Status: None   Collection Time: 06/30/22  4:48 AM  Result Value Ref Range   Magnesium 2.1 1.7 - 2.4 mg/dL  Glucose, capillary     Status: Abnormal   Collection Time: 06/30/22  5:02 AM  Result Value Ref Range   Glucose-Capillary 128 (H) 70 - 99 mg/dL    ASSESSMENT: [name] is a 77 y.o. female, 1 Day Post-Op s/p INTRAMEDULLARY NAILING RIGHT INTERTROCHANTERIC FEMUR FRACTURE  CV/Blood loss: Hemoglobin 12.8 this morning, slight drop but relatively stable from preop.  BP soft this a.m., continue to monitor  PLAN: Weightbearing: WBAT RLE ROM: Okay for unrestricted ROM Incisional and dressing care: Reinforce dressings as needed  Showering: Okay to begin showering getting incisions wet  07/02/2022 Orthopedic device(s): None  Pain management:  1. Tylenol 650 mg q 6 hours scheduled 2. Robaxin 500 mg q 6 hours PRN 3. Oxycodone 5 mg q 4 hours PRN 4.  Morphine 0.5-1 mg q 3 hours PRN VTE prophylaxis: Lovenox, SCDs ID:  Ancef 2gm post op Foley/Lines:  No foley, KVO IVFs Impediments to Fracture Healing: Vitamin D level 45, no additional supplementation needed Dispo: PT/OT evaluation today, dispo pending.  Plan to remove dressings right lower extremity 07/01/2022.  Okay for discharge from ortho standpoint once cleared by medicine team and therapies  D/C recommendations: -Oxycodone, Tylenol for pain control -Aspirin 81 mg daily x 30 days for DVT prophylaxis -No need for additional Vit D supplementation  Follow - up plan: 2 weeks after discharge for wound check and repeat x-rays   Contact information:  Katha Hamming MD, Rushie Nyhan PA-C. After hours and holidays please check [email or web address] for group call information for [facility]   Gwinda Passe, PA-C [phone] (office) [email or web address]

## 2022-06-30 NOTE — Evaluation (Signed)
Physical Therapy Evaluation Patient Details Name: [name] MRN: [id number] DOB: 09/28/1945 Today's Date: 06/30/2022  History of Present Illness  Admitted post fall resulting in right comminuted but nondisplaced intertrochanteric hip fracture; now s/p surgical fixation, IMNail, WBAT; PMH: (Takotsubo)chronic systolic heart failure cardiomyopathy,CAD s/p NSTEMI, hypertension, hyperlipidemia, PMR on low dose prednisone,and type 2 diabetes  Clinical Impression   Pt admitted with above diagnosis. Lives at home with her husband, in a single-level home with a ramped entrance; Prior to admission, pt was independent with ambulation and ADLs, driving local familiar distances; Presents to PT with R hip pain limiting activity tolerance, functional dependencies;  Currently needs up to mod assist for bed mobiltiy and transfers, and walked approx 8 ft with min assist and second person for safety, chair push; Will need to be at supervision functional level to get home - husband can provide reliable supervision, but limited physical assist; Worth considering SNF to maximize independence and safety with mobility and ADLs in prep for getting home; Pt currently with functional limitations due to the deficits listed below (see PT Problem List). Pt will benefit from skilled PT to increase their independence and safety with mobility to allow discharge to the venue listed below.          Recommendations for follow up therapy are one component of a multi-disciplinary discharge planning process, led by the attending physician.  Recommendations may be updated based on patient status, additional functional criteria and insurance authorization.  Follow Up Recommendations Skilled nursing-short term rehab (<3 hours/day); pt is familiar with [facility], and would choose to go there if possible Can patient physically be transported by private vehicle:  (soon)    Assistance Recommended at Discharge Frequent or constant  Supervision/Assistance  Patient can return home with the following  Assistance with cooking/housework;Direct supervision/assist for medications management;Direct supervision/assist for financial management    Equipment Recommendations Rolling walker (2 wheels);BSC/3in1  Recommendations for Other Services       Functional Status Assessment Patient has had a recent decline in their functional status and demonstrates the ability to make significant improvements in function in a reasonable and predictable amount of time.     Precautions / Restrictions Precautions Precautions: Fall Restrictions RLE Weight Bearing: Weight bearing as tolerated      Mobility  Bed Mobility Overal bed mobility: Needs Assistance Bed Mobility: Supine to Sit     Supine to sit: Min assist     General bed mobility comments: very nice half-bridge with LLE to EOB; min handheld assist to pull to sit; able to square off hips at EOB without physical assist, minguard for safety    Transfers Overall transfer level: Needs assistance Equipment used: Rolling walker (2 wheels) Transfers: Sit to/from Stand Sit to Stand: Mod assist           General transfer comment: Light mod assist to power up and stabilize    Ambulation/Gait Ambulation/Gait assistance: Min guard, +2 safety/equipment Gait Distance (Feet): 8 Feet Assistive device: Rolling walker (2 wheels) Gait Pattern/deviations: Decreased step length - left, Decreased stance time - right (approaching step-to pattern)       General Gait Details: Moves quickly; cues to take it slowly and step-by-step cues for sequence; second person present and helpful with recliner management  Stairs            Wheelchair Mobility    Modified Rankin (Stroke Patients Only)       Balance Overall balance assessment: Needs assistance Sitting-balance support: Feet supported, Bilateral  upper extremity supported Sitting balance-Leahy Scale: Fair     Standing  balance support: Bilateral upper extremity supported Standing balance-Leahy Scale: Poor                               Pertinent Vitals/Pain Pain Assessment Pain Location: R hip Pain Descriptors / Indicators: Discomfort, Grimacing    Home Living Family/patient expects to be discharged to:: Private residence Living Arrangements: Spouse/significant other Available Help at Discharge: Family;Available 24 hours/day Type of Home: House Home Access: Ramped entrance Entrance Stairs-Rails: Right;Left     Home Layout: One level Home Equipment: Conservation officer, nature (2 wheels);BSC/3in1;Crutches      Prior Function Prior Level of Function : Independent/Modified Independent                     Hand Dominance   Dominant Hand: Right    Extremity/Trunk Assessment   Upper Extremity Assessment Upper Extremity Assessment: Defer to OT evaluation    Lower Extremity Assessment Lower Extremity Assessment: RLE deficits/detail RLE Deficits / Details: grossly decr AROM and strength, limited by pain postop    Cervical / Trunk Assessment Cervical / Trunk Assessment: Normal  Communication   Communication: HOH  Cognition Arousal/Alertness: Awake/alert Behavior During Therapy: WFL for tasks assessed/performed Overall Cognitive Status: Within Functional Limits for tasks assessed                                          General Comments General comments (skin integrity, edema, etc.): Pt's husband and daughter present for session, and helpful; they are hopeful for being able to dc home; Husband is worried about ability to meet her needs; he has Ca, and pt will need to be at supervision level    Exercises     Assessment/Plan    PT Assessment Patient needs continued PT services  PT Problem List Decreased strength;Decreased range of motion;Decreased activity tolerance;Decreased balance;Decreased mobility;Decreased coordination;Decreased cognition;Decreased  knowledge of use of DME;Decreased safety awareness;Decreased knowledge of precautions;Pain       PT Treatment Interventions DME instruction;Gait training;Functional mobility training;Therapeutic activities;Therapeutic exercise;Balance training;Neuromuscular re-education;Cognitive remediation;Patient/family education;Wheelchair mobility training    PT Goals (Current goals can be found in the Care Plan section)  Acute Rehab PT Goals Patient Stated Goal: Immediate goal was to get up and OOB PT Goal Formulation: With patient/family Time For Goal Achievement: 07/14/22 Potential to Achieve Goals: Good    Frequency Min 5X/week     Co-evaluation               AM-PAC PT "6 Clicks" Mobility  Outcome Measure Help needed turning from your back to your side while in a flat bed without using bedrails?: A Lot Help needed moving from lying on your back to sitting on the side of a flat bed without using bedrails?: A Little Help needed moving to and from a bed to a chair (including a wheelchair)?: A Little Help needed standing up from a chair using your arms (e.g., wheelchair or bedside chair)?: A Lot Help needed to walk in hospital room?: A Lot Help needed climbing 3-5 steps with a railing? : A Lot 6 Click Score: 14    End of Session Equipment Utilized During Treatment: Gait belt Activity Tolerance: Patient tolerated treatment well Patient left: in chair;with call bell/phone within reach;with chair alarm set Nurse Communication: Mobility  status PT Visit Diagnosis: Unsteadiness on feet (R26.81);Other abnormalities of gait and mobility (R26.89);Pain Pain - Right/Left: Right Pain - part of body: Hip    Time: 1036-1100 PT Time Calculation (min) (ACUTE ONLY): 24 min   Charges:   PT Evaluation $PT Eval Moderate Complexity: [place], [place] Office [phone]   [name] 06/30/2022, 2:14 PM

## 2022-06-30 NOTE — Progress Notes (Addendum)
PROGRESS NOTE    [name]  [id number] DOB: [birth year] DOA: 06/28/2022 PCP: Harlan Stains, MD    Brief Narrative:   [name] is a 77 y.o. female with past medical history significant for chronic systolic congestive heart failure, Takotsubo cardiomyopathy, CAD s/p NSTEMI, HTN, HLD, PMR on low-dose prednisone, type 2 diabetes mellitus who presented to [facility] ED on 3/1 with complaints of right hip pain following mechanical fall at home.  Patient was initially taken to orthopedic clinic, [name] in which she had an MRI completed which noted a right comminuted intertrochanteric hip fracture.  Patient was referred to the ED for further evaluation.  Patient denied any loss of consciousness,.  No other pain otherwise.  Further denies shortness of breath, no chest pain, no nausea/vomiting/diarrhea, no cough, no fever/chills.  In the ED, temperature 98.1 F, HR 78, RR 17, BP 151/62, SpO2 95% room air.  WBC 9.0, hemoglobin 14.6, platelets 226.  Sodium 136, potassium 4.1, chloride 99, CO2 28, glucose 196, BUN 33, creatinine 1.22.  Urinalysis unrevealing.  Chest x-ray with no acute cardiopulmonary disease process.  CLINICAL DATA: Hip fracture. Right posterior hip pain extending into the knee after falling this morning. No previous relevant surgery.  IMPRESSION: 1. Mildly comminuted, nondisplaced intertrochanteric right hip fracture with associated marrow and surrounding soft tissue edema. 2. No other fractures identified. 3. Well-circumscribed cystic appearing lesion along the right pelvic sidewall has enlarged from remote abdominal CT in 2006. 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 No aggressive characteristics are identified by noncontrast imaging. Suggest gynecology follow-up.   EDP consulted orthopedics.  TRH consulted for admission for further evaluation management of acute right hip fracture.   Assessment & Plan:   Right intertrochanteric enteric hip  fracture; mildly comminuted Patient presenting to ED with right hip pain following mechanical fall at home.  Outpatient MRI notable for a mildly comminuted, nondisplaced intertrochanteric right hip fracture with surrounding soft tissue edema.  Orthopedics was consulted and patient underwent intramedullary nailing to right femur fracture by Dr. Doreatha Martin on 06/29/2022. -- Orthopedics following, appreciate assistance -- Vitamin D 25-hydroxy level: 45.99 -- Tylenol 650 mg every 6 hours scheduled -- Oxycodone 5 mg every 4 hours as needed moderate pain -- Morphine 0.5 - '1mg'$  IV q2h PRN severe pain -- Robaxin 5 mg every 6 hours as needed muscle spasm -- Lovenox for post op DVT PPX while inpatient with plan for Aspirin 81 mg p.o. daily x 30 days for DVT prophylaxis on discharge per orthopedics -- WBAT RLE -- PT/OT evaluation: Pending -- Will need 2-week follow-up with orthopedics for wound check and repeat x-rays.  Chronic systolic/diastolic congestive heart failure, compensated Takotsubo cardiomyopathy Nonobstructive CAD Essential hypertension TTE 09/11/2021 with LVEF 55-60% (previous 35-40% 06/05/2021), grade 1 diastolic dysfunction, LV with no regional wall motion abnormalities, RV systolic function normal, trivial MR, no aortic stenosis, IVC normal in size.  Follows with cardiology outpatient, Dr. Martinique. -- Metoprolol succinate 100 mg p.o. daily -- Entresto 97-103 mg p.o. twice daily -- Spironolactone 12.5 mg p.o. daily -- Furosemide 20 mg p.o. daily -- Jardiance 10 mg p.o. daily  Hyperlipidemia -- Zetia 10 mg p.o. daily  Type 2 diabetes mellitus Non-insulin-dependent at baseline.  Hemoglobin A1c 6.027 2023, well-controlled. -- Continue Jardiance 10 mg p.o. daily -- Hold home metformin -- Very sensitive SSI for coverage -- CBGs qAC/HS  PMR -- Prednisone 4 mg p.o. daily -- Outpatient follow-up with rheumatology  Insomnia -- Clonazepam 0.5 mg p.o. nightly  as needed   DVT prophylaxis:  enoxaparin (LOVENOX) injection 40 mg Start: 06/30/22 0800 SCDs Start: 06/29/22 1515 SCDs Start: 06/28/22 2154    Code Status: Full Code Family Communication: No family present at bedside this morning  Disposition Plan:  Level of care: Telemetry Medical Status is: Inpatient Remains inpatient appropriate because: Pending PT/OT evaluation, may need SNF placement.    Consultants:  Orthopedics, Dr. Doreatha Martin  Procedures:  intramedullary nailing to right femur fracture by Dr. Doreatha Martin on 06/29/2022.  Antimicrobials:  Perioperative cefazolin   Subjective: Patient seen examined bedside, resting comfortably.  Lying in bed.  No family present.  Complaining of pain to her operative site.  Seen by orthopedics this morning.  Awaiting PT/OT evaluation.  No other questions or concerns at this time. Denies headache, no dizziness, no visual changes, no chest pain, no palpitations, no shortness of breath, no fever/chills/night sweats, no nausea/vomiting/diarrhea, no focal weakness, no fatigue, no paresthesias.  No acute events overnight per nursing staff.  Objective: Vitals:   06/30/22 0018 06/30/22 0451 06/30/22 0807 06/30/22 1018  BP: (!) 106/56 (!) 121/50 (!) 121/52 (!) 116/54  Pulse: 73 68 66 70  Resp: '18 18 16   '$ Temp: 97.6 F (36.4 C) 98.5 F (36.9 C) 98.5 F (36.9 C)   TempSrc: Oral Oral Oral   SpO2: (!) 87% 98% 92% 93%  Weight:      Height:        Intake/Output Summary (Last 24 hours) at 06/30/2022 1106 Last data filed at 06/30/2022 0730 Gross per 24 hour  Intake 1907.29 ml  Output 50 ml  Net 1857.29 ml   Filed Weights   06/28/22 1822 06/28/22 2200  Weight: 58.1 kg 59.9 kg    Examination:  Physical Exam: GEN: NAD, alert and oriented x 3, wd/wn HEENT: NCAT, PERRL, EOMI, sclera clear, MMM PULM: CTAB w/o wheezes/crackles, normal respiratory effort, on room air CV: RRR w/o M/G/R GI: abd soft, NTND, NABS, no R/G/M MSK: no peripheral edema, right hip with tenderness to palpation at  surgical site, no concerning erythema/fluctuance/ecchymosis, surgical dressing noted, clean/dry/intact NEURO: No focal neurological deficits PSYCH: normal mood/affect Integumentary: Right hip surgical site noted as above, otherwise no other concerning rashes/lesions/wounds noted on exposed skin surfaces    Data Reviewed: I have personally reviewed following labs and imaging studies  CBC: Recent Labs  Lab 06/28/22 1926 06/29/22 0450 06/30/22 0448  WBC 9.0 7.5 8.2  NEUTROABS 6.7  --   --   HGB 14.6 13.3 12.8  HCT 45.0 41.8 40.5  MCV 90.4 90.9 91.2  PLT 226 190 [id number]   Basic Metabolic Panel: Recent Labs  Lab 06/28/22 1926 06/29/22 0450 06/30/22 0448  NA 136 140 139  K 4.1 3.7 3.8  CL 99 104 106  CO2 '28 26 24  '$ GLUCOSE 196* 127* 135*  BUN 33* 26* 21  CREATININE 1.22* 0.91 1.12*  CALCIUM 9.2 9.0 8.4*  MG  --   --  2.1   GFR: Estimated Creatinine Clearance: 34.2 mL/min (A) (by C-G formula based on SCr of 1.12 mg/dL (H)). Liver Function Tests: Recent Labs  Lab 06/28/22 2235  ALBUMIN 3.6   No results for input(s): "LIPASE", "AMYLASE" in the last 168 hours. No results for input(s): "AMMONIA" in the last 168 hours. Coagulation Profile: No results for input(s): "INR", "PROTIME" in the last 168 hours. Cardiac Enzymes: No results for input(s): "CKTOTAL", "CKMB", "CKMBINDEX", "TROPONINI" in the last 168 hours. BNP (last 3 results) No results for input(s): "PROBNP" in the  last 8760 hours. HbA1C: No results for input(s): "HGBA1C" in the last 72 hours. CBG: Recent Labs  Lab 06/29/22 2006 06/29/22 2348 06/30/22 0020 06/30/22 0502 06/30/22 0807  GLUCAP 312* 196* 198* 128* 139*   Lipid Profile: No results for input(s): "CHOL", "HDL", "LDLCALC", "TRIG", "CHOLHDL", "LDLDIRECT" in the last 72 hours. Thyroid Function Tests: No results for input(s): "TSH", "T4TOTAL", "FREET4", "T3FREE", "THYROIDAB" in the last 72 hours. Anemia Panel: No results for input(s): "VITAMINB12",  "FOLATE", "FERRITIN", "TIBC", "IRON", "RETICCTPCT" in the last 72 hours. Sepsis Labs: No results for input(s): "PROCALCITON", "LATICACIDVEN" in the last 168 hours.  Recent Results (from the past 240 hour(s))  Surgical PCR screen     Status: None   Collection Time: 06/28/22 10:34 PM   Specimen: Nasal Mucosa; Nasal Swab  Result Value Ref Range Status   MRSA, PCR NEGATIVE NEGATIVE Final   Staphylococcus aureus NEGATIVE NEGATIVE Final    Comment: (NOTE) The Xpert SA Assay (FDA approved for NASAL specimens in patients 79 years of age and older), is one component of a comprehensive surveillance program. It is not intended to diagnose infection nor to guide or monitor treatment. Performed at [facility], [place] [address], [place], [place] [address]          Radiology Studies: DG HIP PORT [place] OR W/O PELVIS 1V RIGHT  Result Date: 06/29/2022 CLINICAL DATA:  Fracture, postop. EXAM: DG HIP (WITH OR WITHOUT PELVIS) 1V PORT RIGHT COMPARISON:  None Available. FINDINGS: Status post right femoral cephalomedullary nail placement. No perihardware fracture. Subcutaneous emphysema about the right hip and proximal thigh. Mild bilateral hip osteoarthritis. IMPRESSION: Status post right femoral cephalomedullary nail placement. No perihardware fracture. Electronically Signed   By: Keane Police D.O.   On: 06/29/2022 19:55   DG FEMUR, MIN 2 VIEWS RIGHT  Result Date: 06/29/2022 CLINICAL DATA:  Elective surgery. EXAM: RIGHT FEMUR 2 VIEWS COMPARISON:  Preoperative MRI. FINDINGS: Four fluoroscopic spot views of the right proximal femur obtained in the operating room. Placement of intramedullary nail with trans trochanteric and distal locking screw fixation of proximal femur fracture. Fluoroscopy time 45 seconds. Dose 6.15 mGy. IMPRESSION: Intraoperative fluoroscopy during proximal femur fracture ORIF. Electronically Signed   By: Keith Rake M.D.   On: 06/29/2022 14:19   DG C-Arm 1-60 Min-No  Report  Result Date: 06/29/2022 Fluoroscopy was utilized by the requesting physician.  No radiographic interpretation.   Chest Portable 1 View  Result Date: 06/28/2022 CLINICAL DATA:  Preoperative evaluation for upcoming right hip surgery EXAM: PORTABLE CHEST 1 VIEW COMPARISON:  08/26/2018 FINDINGS: Cardiac shadow is within normal limits. Aortic calcifications are seen. Lungs are well aerated bilaterally. No focal infiltrate or effusion is noted. Postsurgical changes in the cervical spine are seen. IMPRESSION: No acute abnormality noted. Electronically Signed   By: Inez Catalina M.D.   On: 06/28/2022 22:35        Scheduled Meds:  acetaminophen  650 mg Oral Q6H   cholecalciferol  400 Units Oral QPM   dextrose  12.5 g Intravenous STAT   docusate sodium  100 mg Oral BID   empagliflozin  10 mg Oral Daily   enoxaparin (LOVENOX) injection  40 mg Subcutaneous Q24H   ezetimibe  10 mg Oral q morning   furosemide  20 mg Oral Daily   insulin aspart  0-6 Units Subcutaneous TID WC   metoprolol succinate  100 mg Oral Daily   mupirocin ointment  1 Application Nasal BID   omega-3 acid ethyl  esters  1 g Oral Daily   predniSONE  4 mg Oral Q breakfast   sacubitril-valsartan  1 tablet Oral BID   spironolactone  12.5 mg Oral Daily   Continuous Infusions:  sodium chloride 50 mL/hr at 06/30/22 0644    ceFAZolin (ANCEF) IV Stopped (06/30/22 0539)     LOS: 2 days    Time spent: 48 minutes spent on chart review, discussion with nursing staff, consultants, updating family and interview/physical exam; more than 50% of that time was spent in counseling and/or coordination of care.    Sathvika Ojo J [place] DO [facility] Available via Epic secure chat 7am-7pm After these hours, please refer to coverage provider listed on [email or web address] 06/30/2022, 11:06 AM

## 2022-06-30 NOTE — TOC CAGE-AID Note (Signed)
Transition of Care [facility]) - CAGE-AID Screening   Patient Details  Name: [name] MRN: [id number] Date of Birth: [birth year]  Elvina Sidle, RN Trauma Response Nurse Phone Number: [phone] 06/30/2022, 1:28 PM     CAGE-AID Screening:    Have You Ever Felt You Ought to Cut Down on Your Drinking or Drug Use?: No Have People Annoyed You By Critizing Your Drinking Or Drug Use?: No Have You Felt Bad Or Guilty About Your Drinking Or Drug Use?: No Have You Ever Had a Drink or Used Drugs First Thing In The Morning to Steady Your Nerves or to Get Rid of a Hangover?: No CAGE-AID Score: 0  Substance Abuse Education Offered: No

## 2022-06-30 NOTE — Evaluation (Signed)
Occupational Therapy Evaluation Patient Details Name: [name] MRN: [id number] DOB: 01-22-1946 Today's Date: 06/30/2022   History of Present Illness 77 yo female admitted post fall resulting in right comminuted but nondisplaced intertrochanteric hip fracture. s/p surgical fixation, IM Nail, WBAT. PMH: (Takotsubo)chronic systolic heart failure cardiomyopathy,CAD s/p NSTEMI, hypertension, hyperlipidemia, PMR on low dose prednisone,and type 2 diabetes   Clinical Impression   PTA, pt was living with her husband and was independent. Currently, pt requires Mod-Max A for LB ADLs and Min-Mod A for functional mobility using RW. Pt presenting with decreased balance, strength, ROM, and activity tolerance due to pain. Pt with good family support. Pt would benefit from further acute OT to facilitate safe dc. Recommend dc to SNF for further OT to optimize safety, independence with ADLs, and return to PLOF.     Recommendations for follow up therapy are one component of a multi-disciplinary discharge planning process, led by the attending physician.  Recommendations may be updated based on patient status, additional functional criteria and insurance authorization.   Follow Up Recommendations  Skilled nursing-short term rehab (<3 hours/day)     Assistance Recommended at Discharge Frequent or constant Supervision/Assistance  Patient can return home with the following A lot of help with bathing/dressing/bathroom;A lot of help with walking and/or transfers    Functional Status Assessment  Patient has had a recent decline in their functional status and demonstrates the ability to make significant improvements in function in a reasonable and predictable amount of time.  Equipment Recommendations  BSC/3in1    Recommendations for Other Services PT consult     Precautions / Restrictions Precautions Precautions: Fall Restrictions RLE Weight Bearing: Weight bearing as tolerated      Mobility Bed  Mobility               General bed mobility comments: Standing at EOB with PT upon arrival    Transfers Overall transfer level: Needs assistance Equipment used: Rolling walker (2 wheels) Transfers: Sit to/from Stand Sit to Stand: Mod assist           General transfer comment: Light mod assist to power up and stabilize      Balance Overall balance assessment: Needs assistance Sitting-balance support: Feet supported, Bilateral upper extremity supported Sitting balance-Leahy Scale: Fair     Standing balance support: Bilateral upper extremity supported Standing balance-Leahy Scale: Poor                             ADL either performed or assessed with clinical judgement   ADL Overall ADL's : Needs assistance/impaired Eating/Feeding: Set up;Sitting   Grooming: Set up;Sitting   Upper Body Bathing: Supervision/ safety;Set up;Sitting   Lower Body Bathing: Moderate assistance;Sit to/from stand   Upper Body Dressing : Set up;Supervision/safety;Sitting   Lower Body Dressing: Maximal assistance;Sit to/from stand   Toilet Transfer: Minimal assistance;Ambulation;+2 for safety/equipment;Rolling walker (2 wheels)           Functional mobility during ADLs: Minimal assistance;+2 for physical assistance;+2 for safety/equipment;Rolling walker (2 wheels);Cueing for sequencing General ADL Comments: Pt with decreased balance, strength, and activtiy tolerance.     Vision Baseline Vision/History: 1 Wears glasses (readers)       Perception     Praxis      Pertinent Vitals/Pain Pain Assessment Pain Assessment: 0-10 Pain Score: 5  Pain Location: R hip Pain Descriptors / Indicators: Discomfort, Grimacing Pain Intervention(s): Monitored during session, Repositioned, Limited activity within patient's tolerance  Hand Dominance Right   Extremity/Trunk Assessment Upper Extremity Assessment Upper Extremity Assessment: Overall WFL for tasks assessed    Lower Extremity Assessment Lower Extremity Assessment: Defer to PT evaluation   Cervical / Trunk Assessment Cervical / Trunk Assessment: Normal   Communication Communication Communication: HOH   Cognition Arousal/Alertness: Awake/alert Behavior During Therapy: WFL for tasks assessed/performed Overall Cognitive Status: Impaired/Different from baseline Area of Impairment: Safety/judgement                         Safety/Judgement: Decreased awareness of deficits     General Comments: Requiring cues for awareness of limitations     General Comments  Pt's husband and daughter present for session, and helpful; they are hopeful for being able to dc home; Husband is worried about ability to meet her needs; he has Ca, and pt will need to be at supervision level    Exercises     Shoulder Instructions      Home Living Family/patient expects to be discharged to:: Private residence Living Arrangements: Spouse/significant other Available Help at Discharge: Family;Available 24 hours/day Type of Home: House Home Access: Ramped entrance   Entrance Stairs-Rails: Right;Left Home Layout: One level     Bathroom Shower/Tub: Tub/shower unit;Walk-in shower   Bathroom Toilet: Handicapped height     Home Equipment: Conservation officer, nature (2 wheels);BSC/3in1;Crutches          Prior Functioning/Environment Prior Level of Function : Independent/Modified Independent                        OT Problem List: Decreased strength;Decreased range of motion;Decreased activity tolerance;Impaired balance (sitting and/or standing);Decreased safety awareness;Decreased knowledge of use of DME or AE;Decreased knowledge of precautions;Pain      OT Treatment/Interventions: Self-care/ADL training;Therapeutic exercise;Energy conservation;DME and/or AE instruction;Therapeutic activities;Patient/family education    OT Goals(Current goals can be found in the care plan section) Acute Rehab OT  Goals Patient Stated Goal: Go home safely OT Goal Formulation: With patient/family Time For Goal Achievement: 07/14/22 Potential to Achieve Goals: Good  OT Frequency: Min 2X/week    Co-evaluation PT/OT/SLP Co-Evaluation/Treatment: Yes Reason for Co-Treatment: For patient/therapist safety;To address functional/ADL transfers   OT goals addressed during session: ADL's and self-care      AM-PAC OT "6 Clicks" Daily Activity     Outcome Measure Help from another person eating meals?: None Help from another person taking care of personal grooming?: None Help from another person toileting, which includes using toliet, bedpan, or urinal?: A Lot Help from another person bathing (including washing, rinsing, drying)?: A Lot Help from another person to put on and taking off regular upper body clothing?: A Little Help from another person to put on and taking off regular lower body clothing?: A Lot 6 Click Score: 17   End of Session Equipment Utilized During Treatment: Rolling walker (2 wheels);Gait belt Nurse Communication: Mobility status  Activity Tolerance: Patient tolerated treatment well Patient left: in chair;with call bell/phone within reach;with chair alarm set;with family/visitor present  OT Visit Diagnosis: Unsteadiness on feet (R26.81);Other abnormalities of gait and mobility (R26.89);Muscle weakness (generalized) (M62.81);Pain Pain - part of body: Leg                Time: [id number] OT Time Calculation (min): 21 min Charges:  OT General Charges $OT Visit: 1 Visit OT Evaluation $OT Eval Moderate Complexity: 1 Mod  Caidynce Muzyka MSOT, OTR/L Acute Rehab Office: [place] 06/30/2022, 4:55  PM

## 2022-07-01 ENCOUNTER — Encounter (HOSPITAL_COMMUNITY): Payer: Self-pay | Admitting: Student

## 2022-07-01 DIAGNOSIS — S72001A Fracture of unspecified part of neck of right femur, initial encounter for closed fracture: Secondary | ICD-10-CM | POA: Diagnosis not present

## 2022-07-01 LAB — CBC
HCT: 37.7 % (ref 36.0–46.0)
Hemoglobin: 12.4 g/dL (ref 12.0–15.0)
MCH: 29.7 pg (ref 26.0–34.0)
MCHC: 32.9 g/dL (ref 30.0–36.0)
MCV: 90.2 fL (ref 80.0–100.0)
Platelets: 166 10*3/uL (ref 150–400)
RBC: 4.18 MIL/uL (ref 3.87–5.11)
RDW: 13.1 % (ref 11.5–15.5)
WBC: 7.1 10*3/uL (ref 4.0–10.5)
nRBC: 0 % (ref 0.0–0.2)

## 2022-07-01 LAB — HEMOGLOBIN A1C
Hgb A1c MFr Bld: 6.9 % — ABNORMAL HIGH (ref 4.8–5.6)
Mean Plasma Glucose: 151 mg/dL

## 2022-07-01 LAB — BASIC METABOLIC PANEL
Anion gap: 6 (ref 5–15)
BUN: 17 mg/dL (ref 8–23)
CO2: 27 mmol/L (ref 22–32)
Calcium: 8.3 mg/dL — ABNORMAL LOW (ref 8.9–10.3)
Chloride: 109 mmol/L (ref 98–111)
Creatinine, Ser: 1.08 mg/dL — ABNORMAL HIGH (ref 0.44–1.00)
GFR, Estimated: 53 mL/min — ABNORMAL LOW (ref >60)
Glucose, Bld: 111 mg/dL — ABNORMAL HIGH (ref 70–99)
Potassium: 4.1 mmol/L (ref 3.5–5.1)
Sodium: 142 mmol/L (ref 135–145)

## 2022-07-01 LAB — GLUCOSE, CAPILLARY
Glucose-Capillary: 234 mg/dL — ABNORMAL HIGH (ref 70–99)
Glucose-Capillary: 163 mg/dL — ABNORMAL HIGH (ref 70–99)
Glucose-Capillary: 186 mg/dL — ABNORMAL HIGH (ref 70–99)
Glucose-Capillary: 244 mg/dL — ABNORMAL HIGH (ref 70–99)

## 2022-07-01 MED ORDER — OXYCODONE HCL 5 MG PO TABS: 5.0000 mg | ORAL_TABLET | ORAL | 0 refills | Status: DC | PRN

## 2022-07-01 NOTE — Plan of Care (Signed)

## 2022-07-01 NOTE — Progress Notes (Signed)
Mobility Specialist Progress Note    07/01/22 1632  Mobility  Activity Ambulated with assistance in hallway  Level of Assistance Minimal assist, patient does 75% or more  Assistive Device Front wheel walker  Distance Ambulated (ft) 80 ft  RLE Weight Bearing WBAT  Activity Response Tolerated well  Mobility Referral Yes  $Mobility charge 1 Mobility   Pt received in chair and agreeable. No complaints on walk. Returned to chair with call bell in reach.   Hildred Alamin Mobility Specialist  Please Psychologist, sport and exercise or Rehab Office at [phone]

## 2022-07-01 NOTE — Progress Notes (Signed)
Orthopaedic Trauma Progress Note  SUBJECTIVE: Pain better. Moving the hip and knee better today. Still having difficulty abducting the leg which we discussed may take some time to regain this motion given her injury and surgery. No chest pain. No numbness/tingling throughout right leg. No nausea/vomiting. Therapies recommending SNF, patient agreeable  OBJECTIVE:  Vitals:   07/01/22 0454 07/01/22 0804  BP: 139/72 (!) 121/54  Pulse: 76 90  Resp: 17 16  Temp: 98 F (36.7 C) 98.5 F (36.9 C)  SpO2: 95% 95%    General: Sitting up in bed, no acute distress Respiratory: No increased work of breathing.  Right lower extremity: Dressings removed, incisions are clean, dry, intact.  Does have some general soreness but no significant tenderness over the hip or throughout the thigh.  Ankle DF/PF intact.  Tolerates gentle knee/hip range of motion.  Endorses sensation over all aspects of the foot.  Skin warm and dry.  Compartment soft compressible.  2+ DP pulse  IMAGING: Stable post op imaging.   LABS:  Results for orders placed or performed during the hospital encounter of 06/28/22 (from the past 24 hour(s))  Glucose, capillary     Status: Abnormal   Collection Time: 06/30/22 12:21 PM  Result Value Ref Range   Glucose-Capillary 179 (H) 70 - 99 mg/dL  Glucose, capillary     Status: Abnormal   Collection Time: 06/30/22  4:00 PM  Result Value Ref Range   Glucose-Capillary 197 (H) 70 - 99 mg/dL  Glucose, capillary     Status: Abnormal   Collection Time: 06/30/22  6:03 PM  Result Value Ref Range   Glucose-Capillary 126 (H) 70 - 99 mg/dL  CBC     Status: None   Collection Time: 07/01/22  3:16 AM  Result Value Ref Range   WBC 7.1 4.0 - 10.5 K/uL   RBC 4.18 3.87 - 5.11 MIL/uL   Hemoglobin 12.4 12.0 - 15.0 g/dL   HCT 37.7 36.0 - 46.0 %   MCV 90.2 80.0 - 100.0 fL   MCH 29.7 26.0 - 34.0 pg   MCHC 32.9 30.0 - 36.0 g/dL   RDW 13.1 11.5 - 15.5 %   Platelets 166 150 - 400 K/uL   nRBC 0.0 0.0 - 0.2  %  Basic metabolic panel     Status: Abnormal   Collection Time: 07/01/22  3:16 AM  Result Value Ref Range   Sodium 142 135 - 145 mmol/L   Potassium 4.1 3.5 - 5.1 mmol/L   Chloride 109 98 - 111 mmol/L   CO2 27 22 - 32 mmol/L   Glucose, Bld 111 (H) 70 - 99 mg/dL   BUN 17 8 - 23 mg/dL   Creatinine, Ser 1.08 (H) 0.44 - 1.00 mg/dL   Calcium 8.3 (L) 8.9 - 10.3 mg/dL   GFR, Estimated 53 (L) >60 mL/min   Anion gap 6 5 - 15  Glucose, capillary     Status: Abnormal   Collection Time: 07/01/22  8:37 AM  Result Value Ref Range   Glucose-Capillary 234 (H) 70 - 99 mg/dL   Comment 1 Notify RN     ASSESSMENT: [name] is a 77 y.o. female, 2 Days Post-Op s/p INTRAMEDULLARY NAILING RIGHT INTERTROCHANTERIC FEMUR FRACTURE  CV/Blood loss: Hemoglobin 12.4 this morning, stable.  PLAN: Weightbearing: WBAT RLE ROM: Okay for unrestricted ROM Incisional and dressing care: Change dressing PRN Showering: Okay to begin showering getting incisions wet 07/02/2022 Orthopedic device(s): None  Pain management:  1. Tylenol 650  mg q 6 hours scheduled 2. Robaxin 500 mg q 6 hours PRN 3. Oxycodone 5 mg q 4 hours PRN 4.  Morphine 0.5-1 mg q 3 hours PRN VTE prophylaxis: Lovenox, SCDs ID:  Ancef 2gm post op Foley/Lines:  No foley, KVO IVFs Impediments to Fracture Healing: Vitamin D level 45, no additional supplementation needed Dispo: PT/OT recommending SNF. Okay for discharge from ortho standpoint once cleared by medicine team and therapies  D/C recommendations: -Oxycodone, Tylenol for pain control - signed and placed in chart - Aspirin 81 mg daily x 30 days for DVT prophylaxis -No need for additional Vit D supplementation  Follow - up plan: 2 weeks after discharge for wound check and repeat x-rays   Contact information:  Katha Hamming MD, Rushie Nyhan PA-C. After hours and holidays please check [email or web address] for group call information for [facility]   Gwinda Passe, PA-C [phone]  (office) [email or web address]

## 2022-07-01 NOTE — Discharge Instructions (Signed)
Orthopaedic Trauma Service Discharge Instructions   General Discharge Instructions  WEIGHT BEARING STATUS:weightbearing as tolerated  RANGE OF MOTION/ACTIVITY: ok for hip and knee range of motion as tolerated  Wound Care: You may remove your surgical dressing. Incisions can be left open to air if there is no drainage. Once the incision is completely dry and without drainage, it may be left open to air out.  Showering may begin post-op day #3, (Tuesday 07/02/22).  Clean incision gently with soap and water.  DVT/PE prophylaxis: Aspirin 81 mg daily   Diet: as you were eating previously.  Can use over the counter stool softeners and bowel preparations, such as Miralax, to help with bowel movements.  Narcotics can be constipating.  Be sure to drink plenty of fluids  PAIN MEDICATION USE AND EXPECTATIONS  You have likely been given narcotic medications to help control your pain.  After a traumatic event that results in an fracture (broken bone) with or without surgery, it is ok to use narcotic pain medications to help control one's pain.  We understand that everyone responds to pain differently and each individual patient will be evaluated on a regular basis for the continued need for narcotic medications. Ideally, narcotic medication use should last no more than 6-8 weeks (coinciding with fracture healing).   As a patient it is your responsibility as well to monitor narcotic medication use and report the amount and frequency you use these medications when you come to your office visit.   We would also advise that if you are using narcotic medications, you should take a dose prior to therapy to maximize you participation.  IF YOU ARE ON NARCOTIC MEDICATIONS IT IS NOT PERMISSIBLE TO OPERATE A MOTOR VEHICLE (MOTORCYCLE/CAR/TRUCK/MOPED) OR HEAVY MACHINERY DO NOT MIX NARCOTICS WITH OTHER CNS (CENTRAL NERVOUS SYSTEM) DEPRESSANTS SUCH AS ALCOHOL   STOP SMOKING OR USING NICOTINE PRODUCTS!!!!  As  discussed nicotine severely impairs your body's ability to heal surgical and traumatic wounds but also impairs bone healing.  Wounds and bone heal by forming microscopic blood vessels (angiogenesis) and nicotine is a vasoconstrictor (essentially, shrinks blood vessels).  Therefore, if vasoconstriction occurs to these microscopic blood vessels they essentially disappear and are unable to deliver necessary nutrients to the healing tissue.  This is one modifiable factor that you can do to dramatically increase your chances of healing your injury.    (This means no smoking, no nicotine gum, patches, etc)  DO NOT USE NONSTEROIDAL ANTI-INFLAMMATORY DRUGS (NSAID'S)  Using products such as Advil (ibuprofen), Aleve (naproxen), Motrin (ibuprofen) for additional pain control during fracture healing can delay and/or prevent the healing response.  If you would like to take over the counter (OTC) medication, Tylenol (acetaminophen) is ok.  However, some narcotic medications that are given for pain control contain acetaminophen as well. Therefore, you should not exceed more than 4000 mg of tylenol in a day if you do not have liver disease.  Also note that there are may OTC medicines, such as cold medicines and allergy medicines that my contain tylenol as well.  If you have any questions about medications and/or interactions please ask your doctor/PA or your pharmacist.      ICE AND ELEVATE INJURED/OPERATIVE EXTREMITY  Using ice and elevating the injured extremity above your heart can help with swelling and pain control.  Icing in a pulsatile fashion, such as 20 minutes on and 20 minutes off, can be followed.    Do not place ice directly on skin. Make  sure there is a barrier between to skin and the ice pack.    Using frozen items such as frozen peas works well as the conform nicely to the are that needs to be iced.  USE AN ACE WRAP OR TED HOSE FOR SWELLING CONTROL  In addition to icing and elevation, Ace wraps or TED  hose are used to help limit and resolve swelling.  It is recommended to use Ace wraps or TED hose until you are informed to stop.    When using Ace Wraps start the wrapping distally (farthest away from the body) and wrap proximally (closer to the body)   Example: If you had surgery on your leg or thing and you do not have a splint on, start the ace wrap at the toes and work your way up to the thigh        If you had surgery on your upper extremity and do not have a splint on, start the ace wrap at your fingers and work your way up to the upper arm   [place]: [phone]   VISIT OUR WEBSITE FOR ADDITIONAL INFORMATION: [email or web address]    Discharge Wound Care Instructions  Do NOT apply any ointments, solutions or lotions to pin sites or surgical wounds.  These prevent needed drainage and even though solutions like hydrogen peroxide kill bacteria, they also damage cells lining the pin sites that help fight infection.  Applying lotions or ointments can keep the wounds moist and can cause them to breakdown and open up as well. This can increase the risk for infection. When in doubt call the office.   If any drainage is noted, use foam dressing (mepilex) - These dressing supplies should be available at local medical supply stores [facility], [facility], etc) as well as Management consultant ([facility], [facility], [employer], etc)  Once the incision is completely dry and without drainage, it may be left open to air out.  Showering may begin 36-48 hours later.  Cleaning gently with soap and water.

## 2022-07-01 NOTE — Progress Notes (Signed)
PROGRESS NOTE    [name]  [id number] DOB: [birth year] DOA: 06/28/2022 PCP: Harlan Stains, MD    Brief Narrative:   [name] is a 77 y.o. female with past medical history significant for chronic systolic congestive heart failure, Takotsubo cardiomyopathy, CAD s/p NSTEMI, HTN, HLD, PMR on low-dose prednisone, type 2 diabetes mellitus who presented to [facility] ED on 3/1 with complaints of right hip pain following mechanical fall at home.  Patient was initially taken to orthopedic clinic, [name] in which she had an MRI completed which noted a right comminuted intertrochanteric hip fracture.  Patient was referred to the ED for further evaluation.  Patient denied any loss of consciousness,.  No other pain otherwise.  Further denies shortness of breath, no chest pain, no nausea/vomiting/diarrhea, no cough, no fever/chills.  In the ED, temperature 98.1 F, HR 78, RR 17, BP 151/62, SpO2 95% room air.  WBC 9.0, hemoglobin 14.6, platelets 226.  Sodium 136, potassium 4.1, chloride 99, CO2 28, glucose 196, BUN 33, creatinine 1.22.  Urinalysis unrevealing.  Chest x-ray with no acute cardiopulmonary disease process.  CLINICAL DATA: Hip fracture. Right posterior hip pain extending into the knee after falling this morning. No previous relevant surgery.  IMPRESSION: 1. Mildly comminuted, nondisplaced intertrochanteric right hip fracture with associated marrow and surrounding soft tissue edema. 2. No other fractures identified. 3. Well-circumscribed cystic appearing lesion along the right pelvic sidewall has enlarged from remote abdominal CT in 2006. 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 No aggressive characteristics are identified by noncontrast imaging. Suggest gynecology follow-up.   EDP consulted orthopedics.  TRH consulted for admission for further evaluation management of acute right hip fracture.   Assessment & Plan:   Right intertrochanteric enteric hip  fracture; mildly comminuted Patient presenting to ED with right hip pain following mechanical fall at home.  Outpatient MRI notable for a mildly comminuted, nondisplaced intertrochanteric right hip fracture with surrounding soft tissue edema.  Orthopedics was consulted and patient underwent intramedullary nailing to right femur fracture by Dr. Doreatha Martin on 06/29/2022. -- Orthopedics following, appreciate assistance -- Vitamin D 25-hydroxy level: 45.99 -- Tylenol 650 mg every 6 hours scheduled -- Oxycodone 5 mg every 4 hours as needed moderate pain -- Morphine 0.5 - '1mg'$  IV q2h PRN severe pain -- Robaxin 5 mg every 6 hours as needed muscle spasm -- Lovenox for post op DVT PPX while inpatient with plan for Aspirin 81 mg p.o. daily x 30 days for DVT prophylaxis on discharge per orthopedics -- WBAT RLE -- PT/OT evaluation with recommendation of SNF placement -- Will need 2-week follow-up with orthopedics for wound check and repeat x-rays.  Chronic systolic/diastolic congestive heart failure, compensated Takotsubo cardiomyopathy Nonobstructive CAD Essential hypertension TTE 09/11/2021 with LVEF 55-60% (previous 35-40% 06/05/2021), grade 1 diastolic dysfunction, LV with no regional wall motion abnormalities, RV systolic function normal, trivial MR, no aortic stenosis, IVC normal in size.  Follows with cardiology outpatient, Dr. Martinique. -- Metoprolol succinate 100 mg p.o. daily -- Entresto 97-103 mg p.o. twice daily -- Spironolactone 12.5 mg p.o. daily -- Furosemide 20 mg p.o. daily -- Jardiance 10 mg p.o. daily  Hyperlipidemia -- Zetia 10 mg p.o. daily  Type 2 diabetes mellitus Non-insulin-dependent at baseline.  Hemoglobin A1c 6.027 2023, well-controlled. -- Continue Jardiance 10 mg p.o. daily -- Hold home metformin -- Very sensitive SSI for coverage -- CBGs qAC/HS  PMR -- Prednisone 4 mg p.o. daily -- Outpatient follow-up with rheumatology  Insomnia -- Clonazepam  0.5 mg p.o. nightly as  needed   DVT prophylaxis: enoxaparin (LOVENOX) injection 40 mg Start: 06/30/22 0800 SCDs Start: 06/29/22 1515 SCDs Start: 06/28/22 2154    Code Status: Full Code Family Communication: No family present at bedside this morning  Disposition Plan:  Level of care: Telemetry Medical Status is: Inpatient Remains inpatient appropriate because: Pending SNF placement, medically stable for discharge once bed available    Consultants:  Orthopedics, Dr. Doreatha Martin  Procedures:  intramedullary nailing to right femur fracture by Dr. Doreatha Martin on 06/29/2022.  Antimicrobials:  Perioperative cefazolin   Subjective: Patient seen examined bedside, resting comfortably.  Lying in bed.  No family present.  Continues with pain to her right hip, although improved since yesterday.  Seen by PT and OT with recommendation of SNF placement, patient agreeable.  No other questions or concerns at this time.  Denies headache, no dizziness, no visual changes, no chest pain, no palpitations, no shortness of breath, no fever/chills/night sweats, no nausea/vomiting/diarrhea, no focal weakness, no fatigue, no paresthesias.  No acute events overnight per nursing staff.  Objective: Vitals:   06/30/22 1559 06/30/22 2009 07/01/22 0454 07/01/22 0804  BP: (!) 130/52 (!) 122/54 139/72 (!) 121/54  Pulse: 72 78 76 90  Resp: '16 18 17 16  '$ Temp: 98.1 F (36.7 C) 98.2 F (36.8 C) 98 F (36.7 C) 98.5 F (36.9 C)  TempSrc: Oral Oral Oral Oral  SpO2: 91% 92% 95% 95%  Weight:      Height:        Intake/Output Summary (Last 24 hours) at 07/01/2022 1047 Last data filed at 07/01/2022 0009 Gross per 24 hour  Intake 1205.26 ml  Output 300 ml  Net 905.26 ml   Filed Weights   06/28/22 1822 06/28/22 2200  Weight: 58.1 kg 59.9 kg    Examination:  Physical Exam: GEN: NAD, alert and oriented x 3, wd/wn HEENT: NCAT, PERRL, EOMI, sclera clear, MMM PULM: CTAB w/o wheezes/crackles, normal respiratory effort, on room air CV: RRR w/o  M/G/R GI: abd soft, NTND, NABS, no R/G/M MSK: no peripheral edema, right hip with tenderness to palpation at surgical site, no concerning erythema/fluctuance/ecchymosis, surgical dressing noted, clean/dry/intact NEURO: No focal neurological deficits PSYCH: normal mood/affect Integumentary: Right hip surgical site noted as above, otherwise no other concerning rashes/lesions/wounds noted on exposed skin surfaces    Data Reviewed: I have personally reviewed following labs and imaging studies  CBC: Recent Labs  Lab 06/28/22 1926 06/29/22 0450 06/30/22 0448 07/01/22 0316  WBC 9.0 7.5 8.2 7.1  NEUTROABS 6.7  --   --   --   HGB 14.6 13.3 12.8 12.4  HCT 45.0 41.8 40.5 37.7  MCV 90.4 90.9 91.2 90.2  PLT 226 190 187 [id number]   Basic Metabolic Panel: Recent Labs  Lab 06/28/22 1926 06/29/22 0450 06/30/22 0448 07/01/22 0316  NA 136 140 139 142  K 4.1 3.7 3.8 4.1  CL 99 104 106 109  CO2 '28 26 24 27  '$ GLUCOSE 196* 127* 135* 111*  BUN 33* 26* 21 17  CREATININE 1.22* 0.91 1.12* 1.08*  CALCIUM 9.2 9.0 8.4* 8.3*  MG  --   --  2.1  --    GFR: Estimated Creatinine Clearance: 35.5 mL/min (A) (by C-G formula based on SCr of 1.08 mg/dL (H)). Liver Function Tests: Recent Labs  Lab 06/28/22 2235  ALBUMIN 3.6   No results for input(s): "LIPASE", "AMYLASE" in the last 168 hours. No results for input(s): "AMMONIA" in the last 168 hours.  Coagulation Profile: No results for input(s): "INR", "PROTIME" in the last 168 hours. Cardiac Enzymes: No results for input(s): "CKTOTAL", "CKMB", "CKMBINDEX", "TROPONINI" in the last 168 hours. BNP (last 3 results) No results for input(s): "PROBNP" in the last 8760 hours. HbA1C: Recent Labs    06/28/22 2235  HGBA1C 6.9*   CBG: Recent Labs  Lab 06/30/22 0807 06/30/22 1221 06/30/22 1600 06/30/22 1803 07/01/22 0837  GLUCAP 139* 179* 197* 126* 234*   Lipid Profile: No results for input(s): "CHOL", "HDL", "LDLCALC", "TRIG", "CHOLHDL", "LDLDIRECT"  in the last 72 hours. Thyroid Function Tests: No results for input(s): "TSH", "T4TOTAL", "FREET4", "T3FREE", "THYROIDAB" in the last 72 hours. Anemia Panel: No results for input(s): "VITAMINB12", "FOLATE", "FERRITIN", "TIBC", "IRON", "RETICCTPCT" in the last 72 hours. Sepsis Labs: No results for input(s): "PROCALCITON", "LATICACIDVEN" in the last 168 hours.  Recent Results (from the past 240 hour(s))  Surgical PCR screen     Status: None   Collection Time: 06/28/22 10:34 PM   Specimen: Nasal Mucosa; Nasal Swab  Result Value Ref Range Status   MRSA, PCR NEGATIVE NEGATIVE Final   Staphylococcus aureus NEGATIVE NEGATIVE Final    Comment: (NOTE) The Xpert SA Assay (FDA approved for NASAL specimens in patients 9 years of age and older), is one component of a comprehensive surveillance program. It is not intended to diagnose infection nor to guide or monitor treatment. Performed at [facility], [place] [address] [place], [place] [address]          Radiology Studies: DG HIP PORT Malvin Johns OR W/O PELVIS 1V RIGHT  Result Date: 06/29/2022 CLINICAL DATA:  Fracture, postop. EXAM: DG HIP (WITH OR WITHOUT PELVIS) 1V PORT RIGHT COMPARISON:  None Available. FINDINGS: Status post right femoral cephalomedullary nail placement. No perihardware fracture. Subcutaneous emphysema about the right hip and proximal thigh. Mild bilateral hip osteoarthritis. IMPRESSION: Status post right femoral cephalomedullary nail placement. No perihardware fracture. Electronically Signed   By: Keane Police D.O.   On: 06/29/2022 19:55   DG FEMUR, MIN 2 VIEWS RIGHT  Result Date: 06/29/2022 CLINICAL DATA:  Elective surgery. EXAM: RIGHT FEMUR 2 VIEWS COMPARISON:  Preoperative MRI. FINDINGS: Four fluoroscopic spot views of the right proximal femur obtained in the operating room. Placement of intramedullary nail with trans trochanteric and distal locking screw fixation of proximal femur fracture. Fluoroscopy time 45  seconds. Dose 6.15 mGy. IMPRESSION: Intraoperative fluoroscopy during proximal femur fracture ORIF. Electronically Signed   By: Keith Rake M.D.   On: 06/29/2022 14:19   DG C-Arm 1-60 Min-No Report  Result Date: 06/29/2022 Fluoroscopy was utilized by the requesting physician.  No radiographic interpretation.        Scheduled Meds:  acetaminophen  650 mg Oral Q6H   cholecalciferol  400 Units Oral QPM   docusate sodium  100 mg Oral BID   empagliflozin  10 mg Oral Daily   enoxaparin (LOVENOX) injection  40 mg Subcutaneous Q24H   ezetimibe  10 mg Oral q morning   furosemide  20 mg Oral Daily   insulin aspart  0-6 Units Subcutaneous TID WC   metoprolol succinate  100 mg Oral Daily   mupirocin ointment  1 Application Nasal BID   omega-3 acid ethyl esters  1 g Oral Daily   predniSONE  4 mg Oral Q breakfast   sacubitril-valsartan  1 tablet Oral BID   spironolactone  12.5 mg Oral Daily   Continuous Infusions:  sodium chloride 50 mL/hr at 07/01/22 0009  LOS: 3 days    Time spent: 48 minutes spent on chart review, discussion with nursing staff, consultants, updating family and interview/physical exam; more than 50% of that time was spent in counseling and/or coordination of care.    Herby Amick J [place] DO [facility] Available via Epic secure chat 7am-7pm After these hours, please refer to coverage provider listed on [email or web address] 07/01/2022, 10:47 AM

## 2022-07-01 NOTE — NC FL2 (Signed)
[place] MEDICAID FL2 LEVEL OF CARE FORM     IDENTIFICATION  Patient Name: [name] Birthdate: 05/15/1945 Sex: female Admission Date (Current Location): 06/28/2022  [facility] and [place] Number:  Herbalist and Address:  [facility] [place], [place] [address] [place], [place] [address]      Provider Number: [id number]  Attending Physician Name and Address:  [place] Eric J, DO  Relative Name and Phone Number:  Zaara Dickhaut  [phone]    Current Level of Care: Hospital Recommended Level of Care: [place] Prior Approval Number:    Date Approved/Denied:   PASRR Number: [id number]  Discharge Plan: SNF    Current Diagnoses: Patient Active Problem List   Diagnosis Date Noted   Closed right hip fracture ([place]) 06/28/2022   RA (rheumatoid arthritis) ([place]) 06/07/2021   Acute on chronic combined systolic and diastolic CHF (congestive heart failure) ([place]) 06/05/2021   Anxiety 06/05/2021   MCI (mild cognitive impairment) with memory loss 06/05/2021   NSTEMI (non-ST elevated myocardial infarction) ([place]) 05/21/2021   Coronary artery disease involving native coronary artery of native heart with unstable angina pectoris (HCC)    Takotsubo cardiomyopathy    CVA (cerebral infarction) 06/22/2015   Hypokalemia 06/22/2015   Right arm numbness    Essential hypertension    TIA (transient ischemic attack) 03/02/2014   Type 2 diabetes mellitus with hyperlipidemia (HCC)     Orientation RESPIRATION BLADDER Height & Weight     Self, Time, Situation, Place  Normal Continent Weight: 132 lb 0.9 oz (59.9 kg) Height:  '5\' 3"'$  (160 cm)  BEHAVIORAL SYMPTOMS/MOOD NEUROLOGICAL BOWEL NUTRITION STATUS      Continent Diet (See DC summary)  AMBULATORY STATUS COMMUNICATION OF NEEDS Skin   Limited Assist Verbally Surgical wounds (Incision on R hip)                       Personal Care Assistance Level of Assistance  Bathing, Feeding, Dressing  Bathing Assistance: Limited assistance Feeding assistance: Independent Dressing Assistance: Limited assistance     Functional Limitations Info  Sight, Hearing, Speech Sight Info: Adequate Hearing Info: Impaired Speech Info: Adequate    SPECIAL CARE FACTORS FREQUENCY  PT (By licensed PT), OT (By licensed OT)     PT Frequency: 5x week OT Frequency: 5x week            Contractures Contractures Info: Not present    Additional Factors Info  Code Status, Allergies, Insulin Sliding Scale Code Status Info: Full Allergies Info: Vicodin (Hydrocodone-acetaminophen)  Metformin Hcl  Plavix (Clopidogrel)  Atorvastatin  Crestor (Rosuvastatin Calcium)  Lipitor (Atorvastatin Calcium)   Insulin Sliding Scale Info: See DC summary       Current Medications (07/01/2022):  This is the current hospital active medication list Current Facility-Administered Medications  Medication Dose Route Frequency Provider Last Rate Last Admin   0.9 %  sodium chloride infusion   Intravenous Continuous Corinne Ports, PA-C 50 mL/hr at 07/01/22 0009 Infusion Verify at 07/01/22 0009   acetaminophen (TYLENOL) tablet 650 mg  650 mg Oral Q6H Corinne Ports, PA-C   650 mg at 07/01/22 0548   cholecalciferol (VITAMIN D3) 10 MCG (400 UNIT) tablet 400 Units  400 Units Oral QPM Corinne Ports, PA-C   400 Units at 06/30/22 1737   clonazePAM (KLONOPIN) tablet 0.5 mg  0.5 mg Oral QHS PRN Rushie Nyhan A, PA-C       docusate sodium (COLACE)  capsule 100 mg  100 mg Oral BID Corinne Ports, PA-C   100 mg at 06/30/22 2108   empagliflozin (JARDIANCE) tablet 10 mg  10 mg Oral Daily Corinne Ports, PA-C   10 mg at 06/30/22 1022   enoxaparin (LOVENOX) injection 40 mg  40 mg Subcutaneous Q24H Corinne Ports, PA-C   40 mg at 06/30/22 1024   ezetimibe (ZETIA) tablet 10 mg  10 mg Oral q morning Corinne Ports, PA-C   10 mg at 06/30/22 1020   furosemide (LASIX) tablet 20 mg  20 mg Oral Daily Corinne Ports, PA-C   20 mg at  06/30/22 1022   hydrOXYzine (ATARAX) tablet 25 mg  25 mg Oral TID PRN Rushie Nyhan A, PA-C       insulin aspart (novoLOG) injection 0-6 Units  0-6 Units Subcutaneous TID WC [place] Eric J, DO   1 Units at 06/30/22 1642   metoCLOPramide (REGLAN) tablet 5-10 mg  5-10 mg Oral Q8H PRN Corinne Ports, PA-C       Or   metoCLOPramide (REGLAN) injection 5-10 mg  5-10 mg Intravenous Q8H PRN Corinne Ports, PA-C       metoprolol succinate (TOPROL-XL) 24 hr tablet 100 mg  100 mg Oral Daily Rushie Nyhan A, PA-C   100 mg at 06/30/22 1020   morphine (PF) 2 MG/ML injection 0.5-1 mg  0.5-1 mg Intravenous Q2H PRN Corinne Ports, PA-C       mupirocin ointment (BACTROBAN) 2 % 1 Application  1 Application Nasal BID Corinne Ports, PA-C   1 Application at [id number]   omega-3 acid ethyl esters (LOVAZA) capsule 1 g  1 g Oral Daily Corinne Ports, PA-C   1 g at 06/30/22 1020   ondansetron (ZOFRAN) tablet 4 mg  4 mg Oral Q6H PRN Corinne Ports, PA-C       Or   ondansetron (ZOFRAN) injection 4 mg  4 mg Intravenous Q6H PRN Thereasa Solo, Sarah A, PA-C       oxyCODONE (Oxy IR/ROXICODONE) immediate release tablet 5 mg  5 mg Oral Q4H PRN Corinne Ports, PA-C   5 mg at 06/30/22 2314   polyethylene glycol (MIRALAX / GLYCOLAX) packet 17 g  17 g Oral Daily PRN Thereasa Solo, Sarah A, PA-C       predniSONE (DELTASONE) tablet 4 mg  4 mg Oral Q breakfast Corinne Ports, PA-C   4 mg at 06/30/22 1021   sacubitril-valsartan (ENTRESTO) 97-103 mg per tablet  1 tablet Oral BID Corinne Ports, PA-C   1 tablet at 06/30/22 2112   senna-docusate (Senokot-S) tablet 1 tablet  1 tablet Oral QHS PRN Corinne Ports, PA-C       spironolactone (ALDACTONE) tablet 12.5 mg  12.5 mg Oral Daily Rushie Nyhan A, PA-C   12.5 mg at 06/30/22 1023     Discharge Medications: Please see discharge summary for a list of discharge medications.  Relevant Imaging Results:  Relevant Lab Results:   Additional Information SS# [id number]  [address], LCS[place]

## 2022-07-01 NOTE — Care Management Important Message (Signed)
Important Message  Patient Details  Name: [name] MRN: [id number] Date of Birth: [birth year]   Medicare Important Message Given:  Yes     Hannah Beat 07/01/2022, 12:05 PM

## 2022-07-01 NOTE — TOC Initial Note (Signed)
Transition of Care [facility]) - Initial/Assessment Note    Patient Details  Name: [name] MRN: [id number] Date of Birth: 09-15-1945  Transition of [facility]) CM/SW Contact:    Coralee Pesa, [place] Phone Number: 07/01/2022, 10:53 AM  Clinical Narrative:                 CSW met with pt, she is sitting up in chair and states she feels a lot better. CSW reviewed PT rec for SNF and pt is agreeable. Pt has never been to SNF, but knows people who have. Pt has a preference for [facility], but is agreeable to further faxout. CSW went over Medicare.gov and insurance coverage. Pt states she will update husband when he returns. Workup completed, referral's faxed out. TOC will continue to follow for DC needs.   Expected Discharge Plan: Skilled Nursing Facility Barriers to Discharge: Continued Medical Work up, Ship broker, SNF Pending bed offer   Patient Goals and CMS Choice Patient states their goals for this hospitalization and ongoing recovery are:: Pt wants to be able to return home w/ husband. CMS Medicare.gov Compare Post Acute Care list provided to:: Patient Choice offered to / list presented to : Patient [place] ownership interest in [facility].provided to:: Patient    Expected Discharge Plan and Services     Post Acute Care Choice: [place] Living arrangements for the past 2 months: Single Family Home                                      Prior Living Arrangements/Services Living arrangements for the past 2 months: Single Family Home Lives with:: Spouse Patient language and need for interpreter reviewed:: Yes Do you feel safe going back to the place where you live?: Yes      Need for Family Participation in Patient Care: Yes (Comment) Care giver support system in place?: Yes (comment)   Criminal Activity/Legal Involvement Pertinent to Current Situation/Hospitalization: No - Comment as needed  Activities of Daily Living Home  Assistive Devices/Equipment: CBG Meter, Blood pressure cuff, Walker (specify type), Crutches, Cane (specify quad or straight) ADL Screening (condition at time of admission) Patient's cognitive ability adequate to safely complete daily activities?: Yes Is the patient deaf or have difficulty hearing?: Yes (slight trouble) Does the patient have difficulty seeing, even when wearing glasses/contacts?: No Patient able to express need for assistance with ADLs?: Yes Does the patient have difficulty dressing or bathing?: No Independently performs ADLs?: Yes (appropriate for developmental age) Does the patient have difficulty walking or climbing stairs?: No Weakness of Legs: None Weakness of Arms/Hands: None  Permission Sought/Granted Permission sought to share information with : Family Supports Permission granted to share information with : No              Emotional Assessment Appearance:: Appears stated age Attitude/Demeanor/Rapport: Engaged Affect (typically observed): Appropriate Orientation: : Oriented to Self, Oriented to Place, Oriented to  Time, Oriented to Situation Alcohol / Substance Use: Not Applicable Psych Involvement: No (comment)  Admission diagnosis:  Closed right hip fracture ([place]) [S72.001A] Closed displaced intertrochanteric fracture of right femur, initial encounter ([place]) [id number] Patient Active Problem List   Diagnosis Date Noted   Closed right hip fracture ([place]) 06/28/2022   RA (rheumatoid arthritis) ([place]) 06/07/2021   Acute on chronic combined systolic and diastolic CHF (congestive heart failure) ([place]) 06/05/2021   Anxiety 06/05/2021  MCI (mild cognitive impairment) with memory loss 06/05/2021   NSTEMI (non-ST elevated myocardial infarction) ([place]) 05/21/2021   Coronary artery disease involving native coronary artery of native heart with unstable angina pectoris ([place])    Takotsubo cardiomyopathy    CVA (cerebral infarction) 06/22/2015   Hypokalemia 06/22/2015    Right arm numbness    Essential hypertension    TIA (transient ischemic attack) 03/02/2014   Type 2 diabetes mellitus with hyperlipidemia ([place])    PCP:  Harlan Stains, MD Pharmacy:   [id number] - [place], [place] [address] Phone: [phone] Fax: [phone]  [facility] [address] [place] [address] Phone: [phone] Fax: [phone]     Social Determinants of Health (SDOH) Social History: SDOH Screenings   Depression (PHQ2-9): Low Risk  (07/17/2021)  Tobacco Use: Medium Risk (06/29/2022)   SDOH Interventions:     Readmission Risk Interventions     No data to display

## 2022-07-01 NOTE — Progress Notes (Signed)
Physical Therapy Treatment Patient Details Name: [name] MRN: [id number] DOB: 1945/10/06 Today's Date: 07/01/2022   History of Present Illness 77 yo female admitted post fall resulting in right comminuted but nondisplaced intertrochanteric hip fracture. s/p surgical fixation, IM Nail, WBAT. PMH: (Takotsubo)chronic systolic heart failure cardiomyopathy,CAD s/p NSTEMI, hypertension, hyperlipidemia, PMR on low dose prednisone,and type 2 diabetes    PT Comments    Continuing work on functional mobility and activity tolerance;  noteworthy progress with ability to accept weight onto RLE in stance, leading to more efficient gait pattern; Pt also showing better insight into injury, and functional status, and she agrees to going to SNF for post-acute rehab if she must go  Recommendations for follow up therapy are one component of a multi-disciplinary discharge planning process, led by the attending physician.  Recommendations may be updated based on patient status, additional functional criteria and insurance authorization.  Follow Up Recommendations  Skilled nursing-short term rehab (<3 hours/day) Can patient physically be transported by private vehicle: Yes   Assistance Recommended at Discharge Frequent or constant Supervision/Assistance  Patient can return home with the following Assistance with cooking/housework;Direct supervision/assist for medications management;Direct supervision/assist for financial management   Equipment Recommendations  Rolling walker (2 wheels);BSC/3in1    Recommendations for Other Services       Precautions / Restrictions Precautions Precautions: Fall Restrictions RLE Weight Bearing: Weight bearing as tolerated     Mobility  Bed Mobility                    Transfers Overall transfer level: Needs assistance Equipment used: Rolling walker (2 wheels) Transfers: Sit to/from Stand Sit to Stand: Min assist           General transfer comment:  Min assist to steady; cues for hand placement    Ambulation/Gait Ambulation/Gait assistance: Min guard, +2 safety/equipment Gait Distance (Feet): 45 Feet Assistive device: Rolling walker (2 wheels) Gait Pattern/deviations: Step-to pattern Gait velocity: slow     General Gait Details: Cues for slowing down and for gait sequence; better step length LLE and better stance time RLE; step-through pattern emerging with cues   Stairs             Wheelchair Mobility    Modified Rankin (Stroke Patients Only)       Balance     Sitting balance-[name] Scale: Fair       Standing balance-[name] Scale: Poor                              Cognition Arousal/Alertness: Awake/alert Behavior During Therapy: WFL for tasks assessed/performed Overall Cognitive Status: Impaired/Different from baseline Area of Impairment: Safety/judgement                               General Comments: Much improved awareness of limitations; Pt ehrself states she must be better at moving in order to get home, and agrees to rehab        Exercises Total Joint Exercises Ankle Circles/Pumps: AROM, Both, 10 reps Quad Sets: AROM, Right, 10 reps Gluteal Sets: AROM, Both, 10 reps Towel Squeeze: AROM, Both, 10 reps Heel Slides: AAROM, Right, 10 reps Hip ABduction/ADduction: AAROM, Right, 10 reps Straight Leg Raises: AAROM, Right, 20 reps    General Comments General comments (skin integrity, edema, etc.): Pt had removed the ace wrap that was on her R lower leg, revealing 2  skin tears; Applied dressing to both      Pertinent Vitals/Pain Pain Assessment Pain Assessment: 0-10 Pain Score: 7  Pain Location: R hip post amb Pain Descriptors / Indicators: Discomfort, Grimacing Pain Intervention(s): Monitored during session, Patient requesting pain meds-RN notified    Home Living                          Prior Function            PT Goals (current goals can now be  found in the care plan section) Acute Rehab PT Goals Patient Stated Goal: Be able to be independent enough that her husband doesn't need to worry about her PT Goal Formulation: With patient/family Time For Goal Achievement: 07/14/22 Potential to Achieve Goals: Good Progress towards PT goals: Progressing toward goals    Frequency    Min 5X/week      PT Plan Current plan remains appropriate    Co-evaluation              AM-PAC PT "6 Clicks" Mobility   Outcome Measure  Help needed turning from your back to your side while in a flat bed without using bedrails?: A Lot Help needed moving from lying on your back to sitting on the side of a flat bed without using bedrails?: A Little Help needed moving to and from a bed to a chair (including a wheelchair)?: A Little Help needed standing up from a chair using your arms (e.g., wheelchair or bedside chair)?: A Little Help needed to walk in hospital room?: A Little Help needed climbing 3-5 steps with a railing? : A Lot 6 Click Score: 16    End of Session Equipment Utilized During Treatment: Gait belt Activity Tolerance: Patient tolerated treatment well Patient left: in chair;with call bell/phone within reach;with chair alarm set Nurse Communication: Mobility status PT Visit Diagnosis: Unsteadiness on feet (R26.81);Other abnormalities of gait and mobility (R26.89);Pain Pain - Right/Left: Right Pain - part of body: Hip     Time: [id number] PT Time Calculation (min) (ACUTE ONLY): 28 min  Charges:  $Gait Training: 8-22 mins $Therapeutic Exercise: 8-22 mins                     [name], [place] Office [phone]    [name] 07/01/2022, 2:55 PM

## 2022-07-02 DIAGNOSIS — S72001A Fracture of unspecified part of neck of right femur, initial encounter for closed fracture: Secondary | ICD-10-CM | POA: Diagnosis not present

## 2022-07-02 DIAGNOSIS — E119 Type 2 diabetes mellitus without complications: Secondary | ICD-10-CM | POA: Diagnosis not present

## 2022-07-02 DIAGNOSIS — I252 Old myocardial infarction: Secondary | ICD-10-CM | POA: Diagnosis not present

## 2022-07-02 DIAGNOSIS — M069 Rheumatoid arthritis, unspecified: Secondary | ICD-10-CM | POA: Diagnosis not present

## 2022-07-02 DIAGNOSIS — E785 Hyperlipidemia, unspecified: Secondary | ICD-10-CM | POA: Diagnosis not present

## 2022-07-02 DIAGNOSIS — S72144D Nondisplaced intertrochanteric fracture of right femur, subsequent encounter for closed fracture with routine healing: Secondary | ICD-10-CM | POA: Diagnosis not present

## 2022-07-02 DIAGNOSIS — Z79899 Other long term (current) drug therapy: Secondary | ICD-10-CM | POA: Diagnosis not present

## 2022-07-02 DIAGNOSIS — Z4889 Encounter for other specified surgical aftercare: Secondary | ICD-10-CM | POA: Diagnosis not present

## 2022-07-02 DIAGNOSIS — I5043 Acute on chronic combined systolic (congestive) and diastolic (congestive) heart failure: Secondary | ICD-10-CM | POA: Diagnosis not present

## 2022-07-02 DIAGNOSIS — I2510 Atherosclerotic heart disease of native coronary artery without angina pectoris: Secondary | ICD-10-CM | POA: Diagnosis not present

## 2022-07-02 DIAGNOSIS — I5181 Takotsubo syndrome: Secondary | ICD-10-CM | POA: Diagnosis not present

## 2022-07-02 DIAGNOSIS — M25551 Pain in right hip: Secondary | ICD-10-CM | POA: Diagnosis not present

## 2022-07-02 DIAGNOSIS — Z4789 Encounter for other orthopedic aftercare: Secondary | ICD-10-CM | POA: Diagnosis not present

## 2022-07-02 LAB — CBC
HCT: 34.3 % — ABNORMAL LOW (ref 36.0–46.0)
Hemoglobin: 11.6 g/dL — ABNORMAL LOW (ref 12.0–15.0)
MCH: 29.8 pg (ref 26.0–34.0)
MCHC: 33.8 g/dL (ref 30.0–36.0)
MCV: 88.2 fL (ref 80.0–100.0)
Platelets: 170 10*3/uL (ref 150–400)
RBC: 3.89 MIL/uL (ref 3.87–5.11)
RDW: 13 % (ref 11.5–15.5)
WBC: 6.2 10*3/uL (ref 4.0–10.5)
nRBC: 0 % (ref 0.0–0.2)

## 2022-07-02 LAB — BASIC METABOLIC PANEL
Anion gap: 5 (ref 5–15)
BUN: 12 mg/dL (ref 8–23)
CO2: 27 mmol/L (ref 22–32)
Calcium: 8.3 mg/dL — ABNORMAL LOW (ref 8.9–10.3)
Chloride: 107 mmol/L (ref 98–111)
Creatinine, Ser: 0.96 mg/dL (ref 0.44–1.00)
GFR, Estimated: >60 mL/min (ref >60)
Glucose, Bld: 118 mg/dL — ABNORMAL HIGH (ref 70–99)
Potassium: 3.3 mmol/L — ABNORMAL LOW (ref 3.5–5.1)
Sodium: 139 mmol/L (ref 135–145)

## 2022-07-02 LAB — GLUCOSE, CAPILLARY
Glucose-Capillary: 162 mg/dL — ABNORMAL HIGH (ref 70–99)
Glucose-Capillary: 177 mg/dL — ABNORMAL HIGH (ref 70–99)

## 2022-07-02 MED ORDER — POTASSIUM CHLORIDE CRYS ER 20 MEQ PO TBCR
30.0000 meq | EXTENDED_RELEASE_TABLET | ORAL | Status: AC
  Administered 2022-07-02 (×2): 30 meq via ORAL
  Filled 2022-07-02 (×2): qty 1

## 2022-07-02 MED ORDER — EMPAGLIFLOZIN 10 MG PO TABS: 10.0000 mg | ORAL_TABLET | Freq: Every day | ORAL | Status: DC

## 2022-07-02 MED ORDER — CLONAZEPAM 0.5 MG PO TABS: 0.5000 mg | ORAL_TABLET | Freq: Every evening | ORAL | 0 refills | Status: DC | PRN

## 2022-07-02 MED ORDER — DOCUSATE SODIUM 100 MG PO CAPS: 100.0000 mg | ORAL_CAPSULE | Freq: Two times a day (BID) | ORAL | 0 refills | Status: DC

## 2022-07-02 NOTE — TOC Transition Note (Signed)
Transition of Care [facility]) - CM/SW Discharge Note   Patient Details  Name: [name] MRN: [id number] Date of Birth: March 31, 1946  [facility]) CM/SW Contact:  Coralee Pesa, [place] Phone Number: 07/02/2022, 12:11 PM   Clinical Narrative:     Pt to be transported to [facility] via Husband. Nurse to call report to [phone]  Final next level of care: Skilled Nursing Facility Barriers to Discharge: Barriers Resolved   Patient Goals and CMS Choice CMS Medicare.gov Compare Post Acute Care list provided to:: Patient Choice offered to / list presented to : Patient  Discharge Placement                Patient chooses bed at: [place] Patient to be transferred to facility by: Husband Name of family member notified: Patient to notify Patient and family notified of of transfer: 07/02/22  Discharge Plan and Services Additional resources added to the After Visit Summary for       Post Acute Care Choice: [place]                               Social Determinants of Health (SDOH) Interventions SDOH Screenings   Depression (PHQ2-9): Low Risk  (07/17/2021)  Tobacco Use: Medium Risk (07/01/2022)     Readmission Risk Interventions     No data to display

## 2022-07-02 NOTE — TOC Progression Note (Signed)
Transition of Care [facility]) - Progression Note    Patient Details  Name: [name] MRN: [id number] Date of Birth: 21-Jun-1945  Transition of Care [facility]) CM/SW [place], [place] Phone Number: 07/02/2022, 10:26 AM  Clinical Narrative:    CSW made aware that pt is medically stable to discharge. CSW provided bed offers to pt, she chose [facility]. CSW started authorization, it is pending. [facility] confirmed they have a bed for today. TOC will continue to follow for DC needs.    Expected Discharge Plan: [place] Barriers to Discharge: Continued Medical Work up, Ship broker, SNF Pending bed offer  Expected Discharge Plan and Services     Post Acute Care Choice: [place] arrangements for the past 2 months: Single Family Home                                       Social Determinants of Health (SDOH) Interventions SDOH Screenings   Depression (PHQ2-9): Low Risk  (07/17/2021)  Tobacco Use: Medium Risk (07/01/2022)    Readmission Risk Interventions     No data to display

## 2022-07-02 NOTE — Progress Notes (Signed)
Occupational Therapy Treatment Patient Details Name: [name] MRN: [id number] DOB: May 17, 1945 Today's Date: 07/02/2022   History of present illness 77 yo female admitted post fall resulting in right comminuted but nondisplaced intertrochanteric hip fracture. s/p surgical fixation, IM Nail, WBAT. PMH: (Takotsubo)chronic systolic heart failure cardiomyopathy,CAD s/p NSTEMI, hypertension, hyperlipidemia, PMR on low dose prednisone,and type 2 diabetes   OT comments  Making steady progress toward goals. Pt states she would like to go home however she is concerned about her not being strong enough and concerned about her husband's ability to assist her as well as future falls. Recommend rehab at SNF to facilitate safe DC home. Will continue to follow.    Recommendations for follow up therapy are one component of a multi-disciplinary discharge planning process, led by the attending physician.  Recommendations may be updated based on patient status, additional functional criteria and insurance authorization.    Follow Up Recommendations  Skilled nursing-short term rehab (<3 hours/day)     Assistance Recommended at Discharge Frequent or constant Supervision/Assistance  Patient can return home with the following  A little help with walking and/or transfers;A little help with bathing/dressing/bathroom;Assistance with cooking/housework;Assist for transportation   Equipment Recommendations  BSC/3in1    Recommendations for Other Services      Precautions / Restrictions Precautions Precautions: Fall Restrictions RLE Weight Bearing: Weight bearing as tolerated       Mobility Bed Mobility               General bed mobility comments: OOB in chair    Transfers Overall transfer level: Needs assistance Equipment used: Rolling walker (2 wheels) Transfers: Sit to/from Stand Sit to Stand: Min guard                 Balance     Sitting balance-Leahy Scale: Good        Standing balance-Leahy Scale: Poor                             ADL either performed or assessed with clinical judgement   ADL Overall ADL's : Needs assistance/impaired     Grooming: Set up   Upper Body Bathing: Set up   Lower Body Bathing: Minimal assistance   Upper Body Dressing : Set up   Lower Body Dressing: Minimal assistance   Toilet Transfer: Min guard;Ambulation   Toileting- Clothing Manipulation and Hygiene: Minimal assistance       Functional mobility during ADLs: Minimal assistance      Extremity/Trunk Assessment              Vision       Perception     Praxis      Cognition Arousal/Alertness: Awake/alert Behavior During Therapy: WFL for tasks assessed/performed Overall Cognitive Status: No family/caregiver present to determine baseline cognitive functioning                                 General Comments: appears at baseline        Exercises Exercises: Other exercises Other Exercises Other Exercises: hip ab/adduction using sheet/pillow case to reduce friction    Shoulder Instructions       General Comments  Ambulated @ 100 ft with minguard A @ RW level    Pertinent Vitals/ Pain       Pain Assessment Pain Score: 6  Pain Location: R hip post amb Pain Descriptors /  Indicators: Discomfort, Grimacing Pain Intervention(s): Limited activity within patient's tolerance, Ice applied, Repositioned  Home Living                                          Prior Functioning/Environment              Frequency  Min 2X/week        Progress Toward Goals  OT Goals(current goals can now be found in the care plan section)  Progress towards OT goals: Progressing toward goals  Acute Rehab OT Goals Patient Stated Goal: to get rehab before going home OT Goal Formulation: With patient/family Time For Goal Achievement: 07/14/22 Potential to Achieve Goals: Good ADL Goals Pt Will Perform Lower  Body Dressing: with adaptive equipment;with min guard assist;sit to/from stand Pt Will Transfer to Toilet: with min guard assist;bedside commode;ambulating Pt Will Perform Toileting - Clothing Manipulation and hygiene: with min guard assist;sit to/from stand;sitting/lateral leans Additional ADL Goal #1: Pt will perform bed mobility with Supervision in preparation for ADLs  Plan Discharge plan remains appropriate    Co-evaluation                 AM-PAC OT "6 Clicks" Daily Activity     Outcome Measure   Help from another person eating meals?: None Help from another person taking care of personal grooming?: None Help from another person toileting, which includes using toliet, bedpan, or urinal?: A Little Help from another person bathing (including washing, rinsing, drying)?: A Little Help from another person to put on and taking off regular upper body clothing?: A Little Help from another person to put on and taking off regular lower body clothing?: A Little 6 Click Score: 20    End of Session Equipment Utilized During Treatment: Rolling walker (2 wheels);Gait belt  OT Visit Diagnosis: Unsteadiness on feet (R26.81);Other abnormalities of gait and mobility (R26.89);Muscle weakness (generalized) (M62.81);Pain Pain - Right/Left: Right Pain - part of body: Leg   Activity Tolerance Patient tolerated treatment well   Patient Left in chair;with call bell/phone within reach;with chair alarm set   Nurse Communication Mobility status        Time: [id number] OT Time Calculation (min): 18 min  Charges: OT General Charges $OT Visit: 1 Visit OT Treatments $Self Care/Home Management : 8-22 mins  Maurie Boettcher, OT/L   Acute OT Clinical Specialist [place] Pager [phone] Office [phone]   [facility] 07/02/2022, 11:42 AM

## 2022-07-02 NOTE — Progress Notes (Signed)
Physical Therapy Treatment Patient Details Name: [name] MRN: [id number] DOB: 06-15-1945 Today's Date: 07/02/2022   History of Present Illness 77 yo female admitted post fall resulting in right comminuted but nondisplaced intertrochanteric hip fracture. s/p surgical fixation, IM Nail, WBAT. PMH: (Takotsubo)chronic systolic heart failure cardiomyopathy,CAD s/p NSTEMI, hypertension, hyperlipidemia, PMR on low dose prednisone,and type 2 diabetes    PT Comments    Continuing work on functional mobility and activity tolerance;  Making very nice progress with activity tolerance and gait distance; Overall progressing well; Anticipate continuing good progress at post-acute rehabilitation.    Recommendations for follow up therapy are one component of a multi-disciplinary discharge planning process, led by the attending physician.  Recommendations may be updated based on patient status, additional functional criteria and insurance authorization.  Follow Up Recommendations  Skilled nursing-short term rehab (<3 hours/day) Can patient physically be transported by private vehicle: Yes   Assistance Recommended at Discharge Frequent or constant Supervision/Assistance  Patient can return home with the following Assistance with cooking/housework;Direct supervision/assist for medications management;Direct supervision/assist for financial management   Equipment Recommendations  Rolling walker (2 wheels);BSC/3in1    Recommendations for Other Services       Precautions / Restrictions Precautions Precautions: Fall Precaution Comments: Fall risk is present, but minimized with use of RW Restrictions RLE Weight Bearing: Weight bearing as tolerated     Mobility  Bed Mobility               General bed mobility comments: OOB in chair    Transfers Overall transfer level: Needs assistance Equipment used: Rolling walker (2 wheels) Transfers: Sit to/from Stand Sit to Stand: Min guard            General transfer comment: Stood from [place] well with use of armrests; sat back to recliner without reaching back, but still with good control of descent to sit    Ambulation/Gait Ambulation/Gait assistance: Min guard (with and without physical contact) Gait Distance (Feet): 80 Feet Assistive device: Rolling walker (2 wheels) Gait Pattern/deviations: Step-to pattern (step-through emerging) Gait velocity: slow     General Gait Details: Much improved gait speed, sequence, and control; cues for more upright posture   Stairs             Wheelchair Mobility    Modified Rankin (Stroke Patients Only)       Balance     Sitting balance-Leahy Scale: Good       Standing balance-Leahy Scale: Poor (approaching Fair)                              Cognition Arousal/Alertness: Awake/alert Behavior During Therapy: WFL for tasks assessed/performed Overall Cognitive Status: No family/caregiver present to determine baseline cognitive functioning                                 General Comments: appears at baseline        Exercises      General Comments General comments (skin integrity, edema, etc.): Able to engage in conversation during walk today; Tells this PT she used to volunteer at [employer], and she hopes to get back to volunteering when she recovers      Pertinent Vitals/Pain Pain Assessment Pain Assessment: Faces Faces Pain Scale: Hurts little more Pain Location: R hip post amb Pain Descriptors / Indicators: Discomfort, Grimacing Pain Intervention(s): Monitored during session  Home Living                          Prior Function            PT Goals (current goals can now be found in the care plan section) Acute Rehab PT Goals Patient Stated Goal: Be able to be independent enough that her husband doesn't need to worry about her PT Goal Formulation: With patient/family Time For Goal Achievement:  07/14/22 Potential to Achieve Goals: Good Progress towards PT goals: Progressing toward goals    Frequency    Min 5X/week      PT Plan Current plan remains appropriate    Co-evaluation              AM-PAC PT "6 Clicks" Mobility   Outcome Measure  Help needed turning from your back to your side while in a flat bed without using bedrails?: A Lot Help needed moving from lying on your back to sitting on the side of a flat bed without using bedrails?: A Little Help needed moving to and from a bed to a chair (including a wheelchair)?: A Little Help needed standing up from a chair using your arms (e.g., wheelchair or bedside chair)?: A Little Help needed to walk in hospital room?: A Little Help needed climbing 3-5 steps with a railing? : A Little 6 Click Score: 17    End of Session   Activity Tolerance: Patient tolerated treatment well Patient left: in chair;with call bell/phone within reach;with chair alarm set (readying to eat lunch) Nurse Communication: Mobility status PT Visit Diagnosis: Unsteadiness on feet (R26.81);Other abnormalities of gait and mobility (R26.89);Pain Pain - Right/Left: Right Pain - part of body: Hip     Time: 1212-1223 PT Time Calculation (min) (ACUTE ONLY): 11 min  Charges:  $Gait Training: 8-22 mins                     Roney Marion, [place] Office [phone]    [name] 07/02/2022, 12:35 PM

## 2022-07-02 NOTE — Discharge Summary (Signed)
Physician Discharge Summary  [name] [id number] DOB: 1945/09/26 DOA: 06/28/2022  PCP: Harlan Stains, MD  Admit date: 06/28/2022 Discharge date: 07/02/2022  Admitted From: Home Disposition: [facility]  Recommendations for Outpatient Follow-up:  Follow up with PCP in 1-2 weeks Outpatient follow-up with orthopedics, Dr. Doreatha Martin 2 weeks for wound check and repeat x-rays Continue postoperative DVT prophylaxis with aspirin per orthopedics   Discharge Condition: Stable CODE STATUS: Full code Diet recommendation: Heart healthy/consistent carbohydrate diet  History of present illness:  [name] is a 77 y.o. female with past medical history significant for chronic systolic congestive heart failure, Takotsubo cardiomyopathy, CAD s/p NSTEMI, HTN, HLD, PMR on low-dose prednisone, type 2 diabetes mellitus who presented to [facility] ED on 3/1 with complaints of right hip pain following mechanical fall at home.  Patient was initially taken to orthopedic clinic, [name] in which she had an MRI completed which noted a right comminuted intertrochanteric hip fracture.  Patient was referred to the ED for further evaluation.  Patient denied any loss of consciousness,.  No other pain otherwise.  Further denies shortness of breath, no chest pain, no nausea/vomiting/diarrhea, no cough, no fever/chills.   In the ED, temperature 98.1 F, HR 78, RR 17, BP 151/62, SpO2 95% room air.  WBC 9.0, hemoglobin 14.6, platelets 226.  Sodium 136, potassium 4.1, chloride 99, CO2 28, glucose 196, BUN 33, creatinine 1.22.  Urinalysis unrevealing.  Chest x-ray with no acute cardiopulmonary disease process.   CLINICAL DATA: Hip fracture. Right posterior hip pain extending into the knee after falling this morning. No previous relevant surgery.  IMPRESSION: 1. Mildly comminuted, nondisplaced intertrochanteric right hip fracture with associated marrow and surrounding soft tissue edema. 2. No other fractures  identified. 3. Well-circumscribed cystic appearing lesion along the right pelvic sidewall has enlarged from remote abdominal CT in 2006. 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 No aggressive characteristics are identified by noncontrast imaging. Suggest gynecology follow-up.    EDP consulted orthopedics.  TRH consulted for admission for further evaluation management of acute right hip fracture.  Hospital course:  Right intertrochanteric enteric hip fracture; mildly comminuted Patient presenting to ED with right hip pain following mechanical fall at home.  Outpatient MRI notable for a mildly comminuted, nondisplaced intertrochanteric right hip fracture with surrounding soft tissue edema.  Orthopedics was consulted and patient underwent intramedullary nailing to right femur fracture by Dr. Doreatha Martin on 06/29/2022.  Continue continue pain control with oxycodone, aspirin for postoperative DVT prophylaxis.  Weightbearing as tolerates right lower extremity.Will need 2-week follow-up with orthopedics for wound check and repeat x-rays.   Chronic systolic/diastolic congestive heart failure, compensated Takotsubo cardiomyopathy Nonobstructive CAD Essential hypertension TTE 09/11/2021 with LVEF 55-60% (previous 35-40% 06/05/2021), grade 1 diastolic dysfunction, LV with no regional wall motion abnormalities, RV systolic function normal, trivial MR, no aortic stenosis, IVC normal in size.  Follows with cardiology outpatient, Dr. Martinique.  Continue metoprolol succinate 100 mg p.o. daily, Entresto 97-103 mg p.o. twice daily, Spironolactone 12.5 mg p.o. daily, Furosemide 20 mg p.o. daily, Jardiance 10 mg p.o. daily.   Hyperlipidemia Zetia 10 mg p.o. daily   Type 2 diabetes mellitus Non-insulin-dependent at baseline.  Well-controlled. Continue Jardiance 10 mg p.o. daily, metformin.   PMR Prednisone 4 mg p.o. daily, Outpatient follow-up with rheumatology   Insomnia Clonazepam 0.5 mg p.o. nightly  as needed  Discharge Diagnoses:  Principal Problem:   Closed right hip fracture [facility])    Discharge Instructions  Discharge Instructions  Call MD for:  difficulty breathing, headache or visual disturbances   Complete by: As directed    Call MD for:  extreme fatigue   Complete by: As directed    Call MD for:  persistant dizziness or light-headedness   Complete by: As directed    Call MD for:  persistant nausea and vomiting   Complete by: As directed    Call MD for:  severe uncontrolled pain   Complete by: As directed    Call MD for:  temperature >100.4   Complete by: As directed    Diet - low sodium heart healthy   Complete by: As directed    Increase activity slowly   Complete by: As directed       Allergies as of 07/02/2022       Reactions   Vicodin [hydrocodone-acetaminophen] Swelling   Pt tolerates plain Tylenol   Metformin Hcl Diarrhea   (2000 mg dose)    Plavix [clopidogrel] Hives   Severe itching   Atorvastatin Rash   Crestor [rosuvastatin Calcium] Other (See Comments)   MYALGIA   Lipitor [atorvastatin Calcium] Other (See Comments)   MYALGIA        Medication List     STOP taking these medications    HYDROcodone-acetaminophen 5-325 MG tablet Commonly known as: NORCO/VICODIN       TAKE these medications    acetaminophen 500 MG tablet Commonly known as: TYLENOL Take 1,000 mg by mouth every 6 (six) hours as needed for moderate pain, mild pain or headache.   aspirin 81 MG chewable tablet Commonly known as: Aspirin Childrens Chew 1 tablet (81 mg total) by mouth daily.   cetirizine 10 MG tablet Commonly known as: ZYRTEC Take 10 mg by mouth daily.   clonazePAM 0.5 MG tablet Commonly known as: KLONOPIN Take 1 tablet (0.5 mg total) by mouth at bedtime as needed (sleep).   cyanocobalamin 1000 MCG tablet Commonly known as: VITAMIN B12 Take 1,000 mcg by mouth every evening.   docusate sodium 100 MG capsule Commonly known as: COLACE Take 1  capsule (100 mg total) by mouth 2 (two) times daily.   empagliflozin 10 MG Tabs tablet Commonly known as: JARDIANCE Take 1 tablet (10 mg total) by mouth daily. '20mg'$  in the morning and '5mg'$  in the evening. What changed:  additional instructions Another medication with the same name was removed. Continue taking this medication, and follow the directions you see here.   ezetimibe 10 MG tablet Commonly known as: ZETIA Take 10 mg by mouth daily.   Fish Oil 1000 MG Caps Take 1,000 mg by mouth every evening.   furosemide 20 MG tablet Commonly known as: LASIX Take 1 tablet (20 mg total) by mouth daily.   hydrOXYzine 25 MG tablet Commonly known as: ATARAX Take 25 mg by mouth at bedtime as needed for itching (Sleep).   metFORMIN 500 MG 24 hr tablet Commonly known as: GLUCOPHAGE-XR Take 1,000 mg by mouth daily.   metoprolol succinate 100 MG 24 hr tablet Commonly known as: TOPROL-XL Take 1 tablet (100 mg total) by mouth daily. Take with or immediately following a meal.   oxyCODONE 5 MG immediate release tablet Commonly known as: Oxy IR/ROXICODONE Take 1 tablet (5 mg total) by mouth every 4 (four) hours as needed for moderate pain.   potassium chloride SA 20 MEQ tablet Commonly known as: KLOR-CON M Take 1 tablet (20 mEq total) by mouth daily.   predniSONE 1 MG tablet Commonly known as: DELTASONE Take 4  mg by mouth daily with breakfast.   sacubitril-valsartan 97-103 MG Commonly known as: ENTRESTO Take 1 tablet by mouth 2 (two) times daily.   sertraline 25 MG tablet Commonly known as: ZOLOFT Take 1 tablet (25 mg total) by mouth daily.   simvastatin 40 MG tablet Commonly known as: ZOCOR Take 1 tablet (40 mg total) by mouth daily at 6 PM.   spironolactone 25 MG tablet Commonly known as: ALDACTONE Take 0.5 tablets (12.5 mg total) by mouth daily.   VITAMIN D3 PO Take 1 tablet by mouth every evening.   zinc gluconate 50 MG tablet Take 50 mg by mouth every evening.         Contact information for follow-up providers     Haddix, Thomasene Lot, MD. Schedule an appointment as soon as possible for a visit in 2 week(s).   Specialty: Orthopedic Surgery Why: for wound check and repeat x-rays Contact information: [place] [address] [phone]         Harlan Stains, MD. Schedule an appointment as soon as possible for a visit in 1 week(s).   Specialty: Family Medicine Contact information: [place] [address] [phone]              Contact information for after-discharge care     Destination     [facility], [place] Preferred SNF .   Service: Skilled Nursing Contact information: [place] [address] [phone]                    Allergies  Allergen Reactions   Vicodin [Hydrocodone-Acetaminophen] Swelling    Pt tolerates plain Tylenol   Metformin Hcl Diarrhea    (2000 mg dose)    Plavix [Clopidogrel] Hives    Severe itching   Atorvastatin Rash   Crestor [Rosuvastatin Calcium] Other (See Comments)    MYALGIA   Lipitor [Atorvastatin Calcium] Other (See Comments)    MYALGIA    Consultations: Orthopedics, Dr. Doreatha Martin   Procedures/Studies: DG HIP PORT UNILAT W OR W/O PELVIS 1V RIGHT  Result Date: 06/29/2022 CLINICAL DATA:  Fracture, postop. EXAM: DG HIP (WITH OR WITHOUT PELVIS) 1V PORT RIGHT COMPARISON:  None Available. FINDINGS: Status post right femoral cephalomedullary nail placement. No perihardware fracture. Subcutaneous emphysema about the right hip and proximal thigh. Mild bilateral hip osteoarthritis. IMPRESSION: Status post right femoral cephalomedullary nail placement. No perihardware fracture. Electronically Signed   By: Keane Police D.O.   On: 06/29/2022 19:55   DG FEMUR, MIN 2 VIEWS RIGHT  Result Date: 06/29/2022 CLINICAL DATA:  Elective surgery. EXAM: RIGHT FEMUR 2 VIEWS COMPARISON:  Preoperative MRI. FINDINGS:  Four fluoroscopic spot views of the right proximal femur obtained in the operating room. Placement of intramedullary nail with trans trochanteric and distal locking screw fixation of proximal femur fracture. Fluoroscopy time 45 seconds. Dose 6.15 mGy. IMPRESSION: Intraoperative fluoroscopy during proximal femur fracture ORIF. Electronically Signed   By: Keith Rake M.D.   On: 06/29/2022 14:19   DG C-Arm 1-60 Min-No Report  Result Date: 06/29/2022 Fluoroscopy was utilized by the requesting physician.  No radiographic interpretation.   Chest Portable 1 View  Result Date: 06/28/2022 CLINICAL DATA:  Preoperative evaluation for upcoming right hip surgery EXAM: PORTABLE CHEST 1 VIEW COMPARISON:  08/26/2018 FINDINGS: Cardiac shadow is within normal limits. Aortic calcifications are seen. Lungs are well aerated bilaterally. No focal infiltrate or effusion is noted. Postsurgical changes in the cervical spine  are seen. IMPRESSION: No acute abnormality noted. Electronically Signed   By: Inez Catalina M.D.   On: 06/28/2022 22:35     Subjective: Patient seen examined bedside, resting comfortably.  Sitting in bedside chair.  Pain improved to her right hip, was able to ambulate further with PT yesterday.  Discharging to SNF today.  No other questions or concerns at this time.  Denies headache, no dizziness, no chest pain, no palpitations, no shortness of breath, no fever/chills/night sweats, no nausea/vomiting/diarrhea, no focal weakness, no fatigue, no paresthesias.  No acute events overnight per nursing staff.  Discharge Exam: Vitals:   07/02/22 0552 07/02/22 0916  BP: (!) 129/52 130/66  Pulse: 70 65  Resp: 17 17  Temp: 98.9 F (37.2 C) (!) 97.3 F (36.3 C)  SpO2: 92% 100%   Vitals:   07/01/22 2103 07/02/22 0515 07/02/22 0552 07/02/22 0916  BP: 129/65 125/67 (!) 129/52 130/66  Pulse:  74 70 65  Resp: '20 18 17 17  '$ Temp: 98.6 F (37 C) 98.6 F (37 C) 98.9 F (37.2 C) (!) 97.3 F (36.3 C)   TempSrc: Oral Oral Oral Oral  SpO2: 95% 93% 92% 100%  Weight:      Height:        Physical Exam: GEN: NAD, alert and oriented x 3, wd/wn HEENT: NCAT, PERRL, EOMI, sclera clear, MMM PULM: CTAB w/o wheezes/crackles, normal respiratory effort, on room air CV: RRR w/o M/G/R GI: abd soft, NTND, NABS, no R/G/M MSK: no peripheral edema, moves all extremities independently, surgical incision site noted to right lower extremity with some surrounding ecchymosis, no fluctuance/erythema, neurovascularly intact NEURO: CN II-XII intact, no focal deficits, sensation to light touch intact PSYCH: normal mood/affect Integumentary: Right lower extremity surgical site as above, otherwise no other concerning rashes/lesions/wounds noted on exposed skin surfaces.      The results of significant diagnostics from this hospitalization (including imaging, microbiology, ancillary and laboratory) are listed below for reference.     Microbiology: Recent Results (from the past 240 hour(s))  Surgical PCR screen     Status: None   Collection Time: 06/28/22 10:34 PM   Specimen: Nasal Mucosa; Nasal Swab  Result Value Ref Range Status   MRSA, PCR NEGATIVE NEGATIVE Final   Staphylococcus aureus NEGATIVE NEGATIVE Final    Comment: (NOTE) The Xpert SA Assay (FDA approved for NASAL specimens in patients 39 years of age and older), is one component of a comprehensive surveillance program. It is not intended to diagnose infection nor to guide or monitor treatment. Performed at [facility], [place] [address] [place], [place] [address]      Labs: BNP (last 3 results) No results for input(s): "BNP" in the last 8760 hours. Basic Metabolic Panel: Recent Labs  Lab 06/28/22 1926 06/29/22 0450 06/30/22 0448 07/01/22 0316 07/02/22 0321  NA 136 140 139 142 139  K 4.1 3.7 3.8 4.1 3.3*  CL 99 104 106 109 107  CO2 '28 26 24 27 27  '$ GLUCOSE 196* 127* 135* 111* 118*  BUN 33* 26* '21 17 12  '$ CREATININE 1.22* 0.91  1.12* 1.08* 0.96  CALCIUM 9.2 9.0 8.4* 8.3* 8.3*  MG  --   --  2.1  --   --    Liver Function Tests: Recent Labs  Lab 06/28/22 2235  ALBUMIN 3.6   No results for input(s): "LIPASE", "AMYLASE" in the last 168 hours. No results for input(s): "AMMONIA" in the last 168 hours. CBC: Recent Labs  Lab 06/28/22  1926 06/29/22 0450 06/30/22 0448 07/01/22 0316 07/02/22 0321  WBC 9.0 7.5 8.2 7.1 6.2  NEUTROABS 6.7  --   --   --   --   HGB 14.6 13.3 12.8 12.4 11.6*  HCT 45.0 41.8 40.5 37.7 34.3*  MCV 90.4 90.9 91.2 90.2 88.2  PLT 226 190 187 166 170   Cardiac Enzymes: No results for input(s): "CKTOTAL", "CKMB", "CKMBINDEX", "TROPONINI" in the last 168 hours. BNP: Invalid input(s): "POCBNP" CBG: Recent Labs  Lab 07/01/22 0837 07/01/22 1224 07/01/22 1701 07/01/22 2103 07/02/22 0918  GLUCAP 234* 163* 186* 244* 162*   D-Dimer No results for input(s): "DDIMER" in the last 72 hours. Hgb A1c No results for input(s): "HGBA1C" in the last 72 hours. Lipid Profile No results for input(s): "CHOL", "HDL", "LDLCALC", "TRIG", "CHOLHDL", "LDLDIRECT" in the last 72 hours. Thyroid function studies No results for input(s): "TSH", "T4TOTAL", "T3FREE", "THYROIDAB" in the last 72 hours.  Invalid input(s): "FREET3" Anemia work up No results for input(s): "VITAMINB12", "FOLATE", "FERRITIN", "TIBC", "IRON", "RETICCTPCT" in the last 72 hours. Urinalysis    Component Value Date/Time   COLORURINE STRAW (A) 06/28/2022 2323   APPEARANCEUR CLEAR 06/28/2022 2323   LABSPEC 1.014 06/28/2022 2323   PHURINE 5.0 06/28/2022 2323   GLUCOSEU >=500 (A) 06/28/2022 2323   HGBUR NEGATIVE 06/28/2022 2323   BILIRUBINUR NEGATIVE 06/28/2022 2323   KETONESUR NEGATIVE 06/28/2022 2323   PROTEINUR NEGATIVE 06/28/2022 2323   NITRITE NEGATIVE 06/28/2022 2323   LEUKOCYTESUR NEGATIVE 06/28/2022 2323   Sepsis Labs Recent Labs  Lab 06/29/22 0450 06/30/22 0448 07/01/22 0316 07/02/22 0321  WBC 7.5 8.2 7.1 6.2    Microbiology Recent Results (from the past 240 hour(s))  Surgical PCR screen     Status: None   Collection Time: 06/28/22 10:34 PM   Specimen: Nasal Mucosa; Nasal Swab  Result Value Ref Range Status   MRSA, PCR NEGATIVE NEGATIVE Final   Staphylococcus aureus NEGATIVE NEGATIVE Final    Comment: (NOTE) The Xpert SA Assay (FDA approved for NASAL specimens in patients 23 years of age and older), is one component of a comprehensive surveillance program. It is not intended to diagnose infection nor to guide or monitor treatment. Performed at [facility], [place] [address], [place], [place] [address]      Time coordinating discharge: Over 30 minutes  SIGNED:   Kierra Jezewski J [place] DO  [facility] 07/02/2022, 11:58 AM

## 2022-07-03 DIAGNOSIS — I252 Old myocardial infarction: Secondary | ICD-10-CM | POA: Diagnosis not present

## 2022-07-03 DIAGNOSIS — I5181 Takotsubo syndrome: Secondary | ICD-10-CM | POA: Diagnosis not present

## 2022-07-03 DIAGNOSIS — I2510 Atherosclerotic heart disease of native coronary artery without angina pectoris: Secondary | ICD-10-CM | POA: Diagnosis not present

## 2022-07-10 ENCOUNTER — Other Ambulatory Visit: Payer: Medicare Other

## 2022-07-15 ENCOUNTER — Telehealth: Payer: Self-pay | Admitting: Cardiology

## 2022-07-15 DIAGNOSIS — S72141D Displaced intertrochanteric fracture of right femur, subsequent encounter for closed fracture with routine healing: Secondary | ICD-10-CM | POA: Diagnosis not present

## 2022-07-15 NOTE — Telephone Encounter (Signed)
Pt returning call

## 2022-07-15 NOTE — Telephone Encounter (Signed)
Pt c/o medication issue:  1. Name of Medication: sacubitril-valsartan (ENTRESTO) 97-103 MG  2. How are you currently taking this medication (dosage and times per day)?    3. Are you having a reaction (difficulty breathing--STAT)? no  4. What is your medication issue? Calling to get her refill through the patient assistant program. Please advise

## 2022-07-15 NOTE — Telephone Encounter (Signed)
Called patient, advised that she had been approved for patient assistance, however she did not know how they get refills.   I advised I would like for her to contact the patient assistance program and see what is going on with the medication.  Patient verbalized understanding, she will let us know if we need to do anything.

## 2022-07-15 NOTE — Telephone Encounter (Signed)
Called patient, LVM to call back.  Left call back number.   

## 2022-07-16 DIAGNOSIS — I2510 Atherosclerotic heart disease of native coronary artery without angina pectoris: Secondary | ICD-10-CM | POA: Diagnosis not present

## 2022-07-16 DIAGNOSIS — E785 Hyperlipidemia, unspecified: Secondary | ICD-10-CM | POA: Diagnosis not present

## 2022-07-16 DIAGNOSIS — I5042 Chronic combined systolic (congestive) and diastolic (congestive) heart failure: Secondary | ICD-10-CM | POA: Diagnosis not present

## 2022-07-16 DIAGNOSIS — Z7984 Long term (current) use of oral hypoglycemic drugs: Secondary | ICD-10-CM | POA: Diagnosis not present

## 2022-07-16 DIAGNOSIS — G3184 Mild cognitive impairment, so stated: Secondary | ICD-10-CM | POA: Diagnosis not present

## 2022-07-16 DIAGNOSIS — Z7982 Long term (current) use of aspirin: Secondary | ICD-10-CM | POA: Diagnosis not present

## 2022-07-16 DIAGNOSIS — Z7952 Long term (current) use of systemic steroids: Secondary | ICD-10-CM | POA: Diagnosis not present

## 2022-07-16 DIAGNOSIS — I7090 Unspecified atherosclerosis: Secondary | ICD-10-CM | POA: Diagnosis not present

## 2022-07-16 DIAGNOSIS — W19XXXD Unspecified fall, subsequent encounter: Secondary | ICD-10-CM | POA: Diagnosis not present

## 2022-07-16 DIAGNOSIS — G4700 Insomnia, unspecified: Secondary | ICD-10-CM | POA: Diagnosis not present

## 2022-07-16 DIAGNOSIS — M353 Polymyalgia rheumatica: Secondary | ICD-10-CM | POA: Diagnosis not present

## 2022-07-16 DIAGNOSIS — F419 Anxiety disorder, unspecified: Secondary | ICD-10-CM | POA: Diagnosis not present

## 2022-07-16 DIAGNOSIS — S72144D Nondisplaced intertrochanteric fracture of right femur, subsequent encounter for closed fracture with routine healing: Secondary | ICD-10-CM | POA: Diagnosis not present

## 2022-07-16 DIAGNOSIS — E119 Type 2 diabetes mellitus without complications: Secondary | ICD-10-CM | POA: Diagnosis not present

## 2022-07-16 DIAGNOSIS — I252 Old myocardial infarction: Secondary | ICD-10-CM | POA: Diagnosis not present

## 2022-07-16 DIAGNOSIS — Z8673 Personal history of transient ischemic attack (TIA), and cerebral infarction without residual deficits: Secondary | ICD-10-CM | POA: Diagnosis not present

## 2022-07-16 DIAGNOSIS — M069 Rheumatoid arthritis, unspecified: Secondary | ICD-10-CM | POA: Diagnosis not present

## 2022-07-16 DIAGNOSIS — Z9181 History of falling: Secondary | ICD-10-CM | POA: Diagnosis not present

## 2022-07-16 DIAGNOSIS — I110 Hypertensive heart disease with heart failure: Secondary | ICD-10-CM | POA: Diagnosis not present

## 2022-07-16 DIAGNOSIS — M1990 Unspecified osteoarthritis, unspecified site: Secondary | ICD-10-CM | POA: Diagnosis not present

## 2022-07-17 DIAGNOSIS — M069 Rheumatoid arthritis, unspecified: Secondary | ICD-10-CM | POA: Diagnosis not present

## 2022-07-17 DIAGNOSIS — S72144D Nondisplaced intertrochanteric fracture of right femur, subsequent encounter for closed fracture with routine healing: Secondary | ICD-10-CM | POA: Diagnosis not present

## 2022-07-17 DIAGNOSIS — Z8673 Personal history of transient ischemic attack (TIA), and cerebral infarction without residual deficits: Secondary | ICD-10-CM | POA: Diagnosis not present

## 2022-07-17 DIAGNOSIS — I5042 Chronic combined systolic (congestive) and diastolic (congestive) heart failure: Secondary | ICD-10-CM | POA: Diagnosis not present

## 2022-07-17 DIAGNOSIS — F419 Anxiety disorder, unspecified: Secondary | ICD-10-CM | POA: Diagnosis not present

## 2022-07-17 DIAGNOSIS — M1990 Unspecified osteoarthritis, unspecified site: Secondary | ICD-10-CM | POA: Diagnosis not present

## 2022-07-17 DIAGNOSIS — E785 Hyperlipidemia, unspecified: Secondary | ICD-10-CM | POA: Diagnosis not present

## 2022-07-17 DIAGNOSIS — G3184 Mild cognitive impairment, so stated: Secondary | ICD-10-CM | POA: Diagnosis not present

## 2022-07-17 DIAGNOSIS — I7090 Unspecified atherosclerosis: Secondary | ICD-10-CM | POA: Diagnosis not present

## 2022-07-17 DIAGNOSIS — I2510 Atherosclerotic heart disease of native coronary artery without angina pectoris: Secondary | ICD-10-CM | POA: Diagnosis not present

## 2022-07-17 DIAGNOSIS — I252 Old myocardial infarction: Secondary | ICD-10-CM | POA: Diagnosis not present

## 2022-07-17 DIAGNOSIS — M353 Polymyalgia rheumatica: Secondary | ICD-10-CM | POA: Diagnosis not present

## 2022-07-17 DIAGNOSIS — E119 Type 2 diabetes mellitus without complications: Secondary | ICD-10-CM | POA: Diagnosis not present

## 2022-07-17 DIAGNOSIS — I110 Hypertensive heart disease with heart failure: Secondary | ICD-10-CM | POA: Diagnosis not present

## 2022-07-17 DIAGNOSIS — G4700 Insomnia, unspecified: Secondary | ICD-10-CM | POA: Diagnosis not present

## 2022-07-17 DIAGNOSIS — Z7982 Long term (current) use of aspirin: Secondary | ICD-10-CM | POA: Diagnosis not present

## 2022-07-19 DIAGNOSIS — I5022 Chronic systolic (congestive) heart failure: Secondary | ICD-10-CM | POA: Diagnosis not present

## 2022-07-19 DIAGNOSIS — M353 Polymyalgia rheumatica: Secondary | ICD-10-CM | POA: Diagnosis not present

## 2022-07-19 DIAGNOSIS — Z8673 Personal history of transient ischemic attack (TIA), and cerebral infarction without residual deficits: Secondary | ICD-10-CM | POA: Diagnosis not present

## 2022-07-19 DIAGNOSIS — G4700 Insomnia, unspecified: Secondary | ICD-10-CM | POA: Diagnosis not present

## 2022-07-19 DIAGNOSIS — I130 Hypertensive heart and chronic kidney disease with heart failure and stage 1 through stage 4 chronic kidney disease, or unspecified chronic kidney disease: Secondary | ICD-10-CM | POA: Diagnosis not present

## 2022-07-19 DIAGNOSIS — I110 Hypertensive heart disease with heart failure: Secondary | ICD-10-CM | POA: Diagnosis not present

## 2022-07-19 DIAGNOSIS — M069 Rheumatoid arthritis, unspecified: Secondary | ICD-10-CM | POA: Diagnosis not present

## 2022-07-19 DIAGNOSIS — G3184 Mild cognitive impairment, so stated: Secondary | ICD-10-CM | POA: Diagnosis not present

## 2022-07-19 DIAGNOSIS — I252 Old myocardial infarction: Secondary | ICD-10-CM | POA: Diagnosis not present

## 2022-07-19 DIAGNOSIS — I5042 Chronic combined systolic (congestive) and diastolic (congestive) heart failure: Secondary | ICD-10-CM | POA: Diagnosis not present

## 2022-07-19 DIAGNOSIS — F419 Anxiety disorder, unspecified: Secondary | ICD-10-CM | POA: Diagnosis not present

## 2022-07-19 DIAGNOSIS — I7090 Unspecified atherosclerosis: Secondary | ICD-10-CM | POA: Diagnosis not present

## 2022-07-19 DIAGNOSIS — E119 Type 2 diabetes mellitus without complications: Secondary | ICD-10-CM | POA: Diagnosis not present

## 2022-07-19 DIAGNOSIS — Z7982 Long term (current) use of aspirin: Secondary | ICD-10-CM | POA: Diagnosis not present

## 2022-07-19 DIAGNOSIS — M1990 Unspecified osteoarthritis, unspecified site: Secondary | ICD-10-CM | POA: Diagnosis not present

## 2022-07-19 DIAGNOSIS — I2510 Atherosclerotic heart disease of native coronary artery without angina pectoris: Secondary | ICD-10-CM | POA: Diagnosis not present

## 2022-07-19 DIAGNOSIS — S72144D Nondisplaced intertrochanteric fracture of right femur, subsequent encounter for closed fracture with routine healing: Secondary | ICD-10-CM | POA: Diagnosis not present

## 2022-07-19 DIAGNOSIS — E785 Hyperlipidemia, unspecified: Secondary | ICD-10-CM | POA: Diagnosis not present

## 2022-07-24 DIAGNOSIS — G3184 Mild cognitive impairment, so stated: Secondary | ICD-10-CM | POA: Diagnosis not present

## 2022-07-24 DIAGNOSIS — Z7982 Long term (current) use of aspirin: Secondary | ICD-10-CM | POA: Diagnosis not present

## 2022-07-24 DIAGNOSIS — I5042 Chronic combined systolic (congestive) and diastolic (congestive) heart failure: Secondary | ICD-10-CM | POA: Diagnosis not present

## 2022-07-24 DIAGNOSIS — I7090 Unspecified atherosclerosis: Secondary | ICD-10-CM | POA: Diagnosis not present

## 2022-07-24 DIAGNOSIS — M353 Polymyalgia rheumatica: Secondary | ICD-10-CM | POA: Diagnosis not present

## 2022-07-24 DIAGNOSIS — G4700 Insomnia, unspecified: Secondary | ICD-10-CM | POA: Diagnosis not present

## 2022-07-24 DIAGNOSIS — M069 Rheumatoid arthritis, unspecified: Secondary | ICD-10-CM | POA: Diagnosis not present

## 2022-07-24 DIAGNOSIS — S72144D Nondisplaced intertrochanteric fracture of right femur, subsequent encounter for closed fracture with routine healing: Secondary | ICD-10-CM | POA: Diagnosis not present

## 2022-07-24 DIAGNOSIS — E119 Type 2 diabetes mellitus without complications: Secondary | ICD-10-CM | POA: Diagnosis not present

## 2022-07-24 DIAGNOSIS — I2510 Atherosclerotic heart disease of native coronary artery without angina pectoris: Secondary | ICD-10-CM | POA: Diagnosis not present

## 2022-07-24 DIAGNOSIS — F419 Anxiety disorder, unspecified: Secondary | ICD-10-CM | POA: Diagnosis not present

## 2022-07-24 DIAGNOSIS — I252 Old myocardial infarction: Secondary | ICD-10-CM | POA: Diagnosis not present

## 2022-07-24 DIAGNOSIS — M1990 Unspecified osteoarthritis, unspecified site: Secondary | ICD-10-CM | POA: Diagnosis not present

## 2022-07-24 DIAGNOSIS — I110 Hypertensive heart disease with heart failure: Secondary | ICD-10-CM | POA: Diagnosis not present

## 2022-07-24 DIAGNOSIS — E785 Hyperlipidemia, unspecified: Secondary | ICD-10-CM | POA: Diagnosis not present

## 2022-07-24 DIAGNOSIS — Z8673 Personal history of transient ischemic attack (TIA), and cerebral infarction without residual deficits: Secondary | ICD-10-CM | POA: Diagnosis not present

## 2022-07-26 DIAGNOSIS — Z7982 Long term (current) use of aspirin: Secondary | ICD-10-CM | POA: Diagnosis not present

## 2022-07-26 DIAGNOSIS — M1990 Unspecified osteoarthritis, unspecified site: Secondary | ICD-10-CM | POA: Diagnosis not present

## 2022-07-26 DIAGNOSIS — I110 Hypertensive heart disease with heart failure: Secondary | ICD-10-CM | POA: Diagnosis not present

## 2022-07-26 DIAGNOSIS — G3184 Mild cognitive impairment, so stated: Secondary | ICD-10-CM | POA: Diagnosis not present

## 2022-07-26 DIAGNOSIS — G4700 Insomnia, unspecified: Secondary | ICD-10-CM | POA: Diagnosis not present

## 2022-07-26 DIAGNOSIS — I7090 Unspecified atherosclerosis: Secondary | ICD-10-CM | POA: Diagnosis not present

## 2022-07-26 DIAGNOSIS — F419 Anxiety disorder, unspecified: Secondary | ICD-10-CM | POA: Diagnosis not present

## 2022-07-26 DIAGNOSIS — I2510 Atherosclerotic heart disease of native coronary artery without angina pectoris: Secondary | ICD-10-CM | POA: Diagnosis not present

## 2022-07-26 DIAGNOSIS — I5042 Chronic combined systolic (congestive) and diastolic (congestive) heart failure: Secondary | ICD-10-CM | POA: Diagnosis not present

## 2022-07-26 DIAGNOSIS — S72144D Nondisplaced intertrochanteric fracture of right femur, subsequent encounter for closed fracture with routine healing: Secondary | ICD-10-CM | POA: Diagnosis not present

## 2022-07-26 DIAGNOSIS — M353 Polymyalgia rheumatica: Secondary | ICD-10-CM | POA: Diagnosis not present

## 2022-07-26 DIAGNOSIS — M069 Rheumatoid arthritis, unspecified: Secondary | ICD-10-CM | POA: Diagnosis not present

## 2022-07-26 DIAGNOSIS — E785 Hyperlipidemia, unspecified: Secondary | ICD-10-CM | POA: Diagnosis not present

## 2022-07-26 DIAGNOSIS — I252 Old myocardial infarction: Secondary | ICD-10-CM | POA: Diagnosis not present

## 2022-07-26 DIAGNOSIS — Z8673 Personal history of transient ischemic attack (TIA), and cerebral infarction without residual deficits: Secondary | ICD-10-CM | POA: Diagnosis not present

## 2022-07-26 DIAGNOSIS — E119 Type 2 diabetes mellitus without complications: Secondary | ICD-10-CM | POA: Diagnosis not present

## 2022-07-31 DIAGNOSIS — E119 Type 2 diabetes mellitus without complications: Secondary | ICD-10-CM | POA: Diagnosis not present

## 2022-07-31 DIAGNOSIS — G3184 Mild cognitive impairment, so stated: Secondary | ICD-10-CM | POA: Diagnosis not present

## 2022-07-31 DIAGNOSIS — I7090 Unspecified atherosclerosis: Secondary | ICD-10-CM | POA: Diagnosis not present

## 2022-07-31 DIAGNOSIS — G4700 Insomnia, unspecified: Secondary | ICD-10-CM | POA: Diagnosis not present

## 2022-07-31 DIAGNOSIS — I110 Hypertensive heart disease with heart failure: Secondary | ICD-10-CM | POA: Diagnosis not present

## 2022-07-31 DIAGNOSIS — M353 Polymyalgia rheumatica: Secondary | ICD-10-CM | POA: Diagnosis not present

## 2022-07-31 DIAGNOSIS — I5042 Chronic combined systolic (congestive) and diastolic (congestive) heart failure: Secondary | ICD-10-CM | POA: Diagnosis not present

## 2022-07-31 DIAGNOSIS — I2510 Atherosclerotic heart disease of native coronary artery without angina pectoris: Secondary | ICD-10-CM | POA: Diagnosis not present

## 2022-07-31 DIAGNOSIS — Z7982 Long term (current) use of aspirin: Secondary | ICD-10-CM | POA: Diagnosis not present

## 2022-07-31 DIAGNOSIS — M1990 Unspecified osteoarthritis, unspecified site: Secondary | ICD-10-CM | POA: Diagnosis not present

## 2022-07-31 DIAGNOSIS — F419 Anxiety disorder, unspecified: Secondary | ICD-10-CM | POA: Diagnosis not present

## 2022-07-31 DIAGNOSIS — I252 Old myocardial infarction: Secondary | ICD-10-CM | POA: Diagnosis not present

## 2022-07-31 DIAGNOSIS — E785 Hyperlipidemia, unspecified: Secondary | ICD-10-CM | POA: Diagnosis not present

## 2022-07-31 DIAGNOSIS — Z8673 Personal history of transient ischemic attack (TIA), and cerebral infarction without residual deficits: Secondary | ICD-10-CM | POA: Diagnosis not present

## 2022-07-31 DIAGNOSIS — M069 Rheumatoid arthritis, unspecified: Secondary | ICD-10-CM | POA: Diagnosis not present

## 2022-07-31 DIAGNOSIS — S72144D Nondisplaced intertrochanteric fracture of right femur, subsequent encounter for closed fracture with routine healing: Secondary | ICD-10-CM | POA: Diagnosis not present

## 2022-08-05 ENCOUNTER — Other Ambulatory Visit: Payer: Self-pay | Admitting: Cardiology

## 2022-08-06 ENCOUNTER — Other Ambulatory Visit: Payer: Medicare Other

## 2022-08-07 ENCOUNTER — Inpatient Hospital Stay: Admission: RE | Admit: 2022-08-07 | Payer: Medicare Other | Source: Ambulatory Visit

## 2022-08-07 DIAGNOSIS — E119 Type 2 diabetes mellitus without complications: Secondary | ICD-10-CM | POA: Diagnosis not present

## 2022-08-07 DIAGNOSIS — G3184 Mild cognitive impairment, so stated: Secondary | ICD-10-CM | POA: Diagnosis not present

## 2022-08-07 DIAGNOSIS — M1990 Unspecified osteoarthritis, unspecified site: Secondary | ICD-10-CM | POA: Diagnosis not present

## 2022-08-07 DIAGNOSIS — F419 Anxiety disorder, unspecified: Secondary | ICD-10-CM | POA: Diagnosis not present

## 2022-08-07 DIAGNOSIS — M353 Polymyalgia rheumatica: Secondary | ICD-10-CM | POA: Diagnosis not present

## 2022-08-07 DIAGNOSIS — G4700 Insomnia, unspecified: Secondary | ICD-10-CM | POA: Diagnosis not present

## 2022-08-07 DIAGNOSIS — Z8673 Personal history of transient ischemic attack (TIA), and cerebral infarction without residual deficits: Secondary | ICD-10-CM | POA: Diagnosis not present

## 2022-08-07 DIAGNOSIS — M069 Rheumatoid arthritis, unspecified: Secondary | ICD-10-CM | POA: Diagnosis not present

## 2022-08-07 DIAGNOSIS — I2510 Atherosclerotic heart disease of native coronary artery without angina pectoris: Secondary | ICD-10-CM | POA: Diagnosis not present

## 2022-08-07 DIAGNOSIS — I110 Hypertensive heart disease with heart failure: Secondary | ICD-10-CM | POA: Diagnosis not present

## 2022-08-07 DIAGNOSIS — Z7982 Long term (current) use of aspirin: Secondary | ICD-10-CM | POA: Diagnosis not present

## 2022-08-07 DIAGNOSIS — S72144D Nondisplaced intertrochanteric fracture of right femur, subsequent encounter for closed fracture with routine healing: Secondary | ICD-10-CM | POA: Diagnosis not present

## 2022-08-07 DIAGNOSIS — I252 Old myocardial infarction: Secondary | ICD-10-CM | POA: Diagnosis not present

## 2022-08-07 DIAGNOSIS — I7090 Unspecified atherosclerosis: Secondary | ICD-10-CM | POA: Diagnosis not present

## 2022-08-07 DIAGNOSIS — E785 Hyperlipidemia, unspecified: Secondary | ICD-10-CM | POA: Diagnosis not present

## 2022-08-07 DIAGNOSIS — I5042 Chronic combined systolic (congestive) and diastolic (congestive) heart failure: Secondary | ICD-10-CM | POA: Diagnosis not present

## 2022-08-12 DIAGNOSIS — S72141D Displaced intertrochanteric fracture of right femur, subsequent encounter for closed fracture with routine healing: Secondary | ICD-10-CM | POA: Diagnosis not present

## 2022-08-16 IMAGING — DX DG CHEST 1V PORT
1 series · 1 of 1 positions shown · non-contrast
Comparison: 06/22/2015

CLINICAL DATA: Shortness of breath

EXAM:
PORTABLE CHEST 1 VIEW

[chest ap]
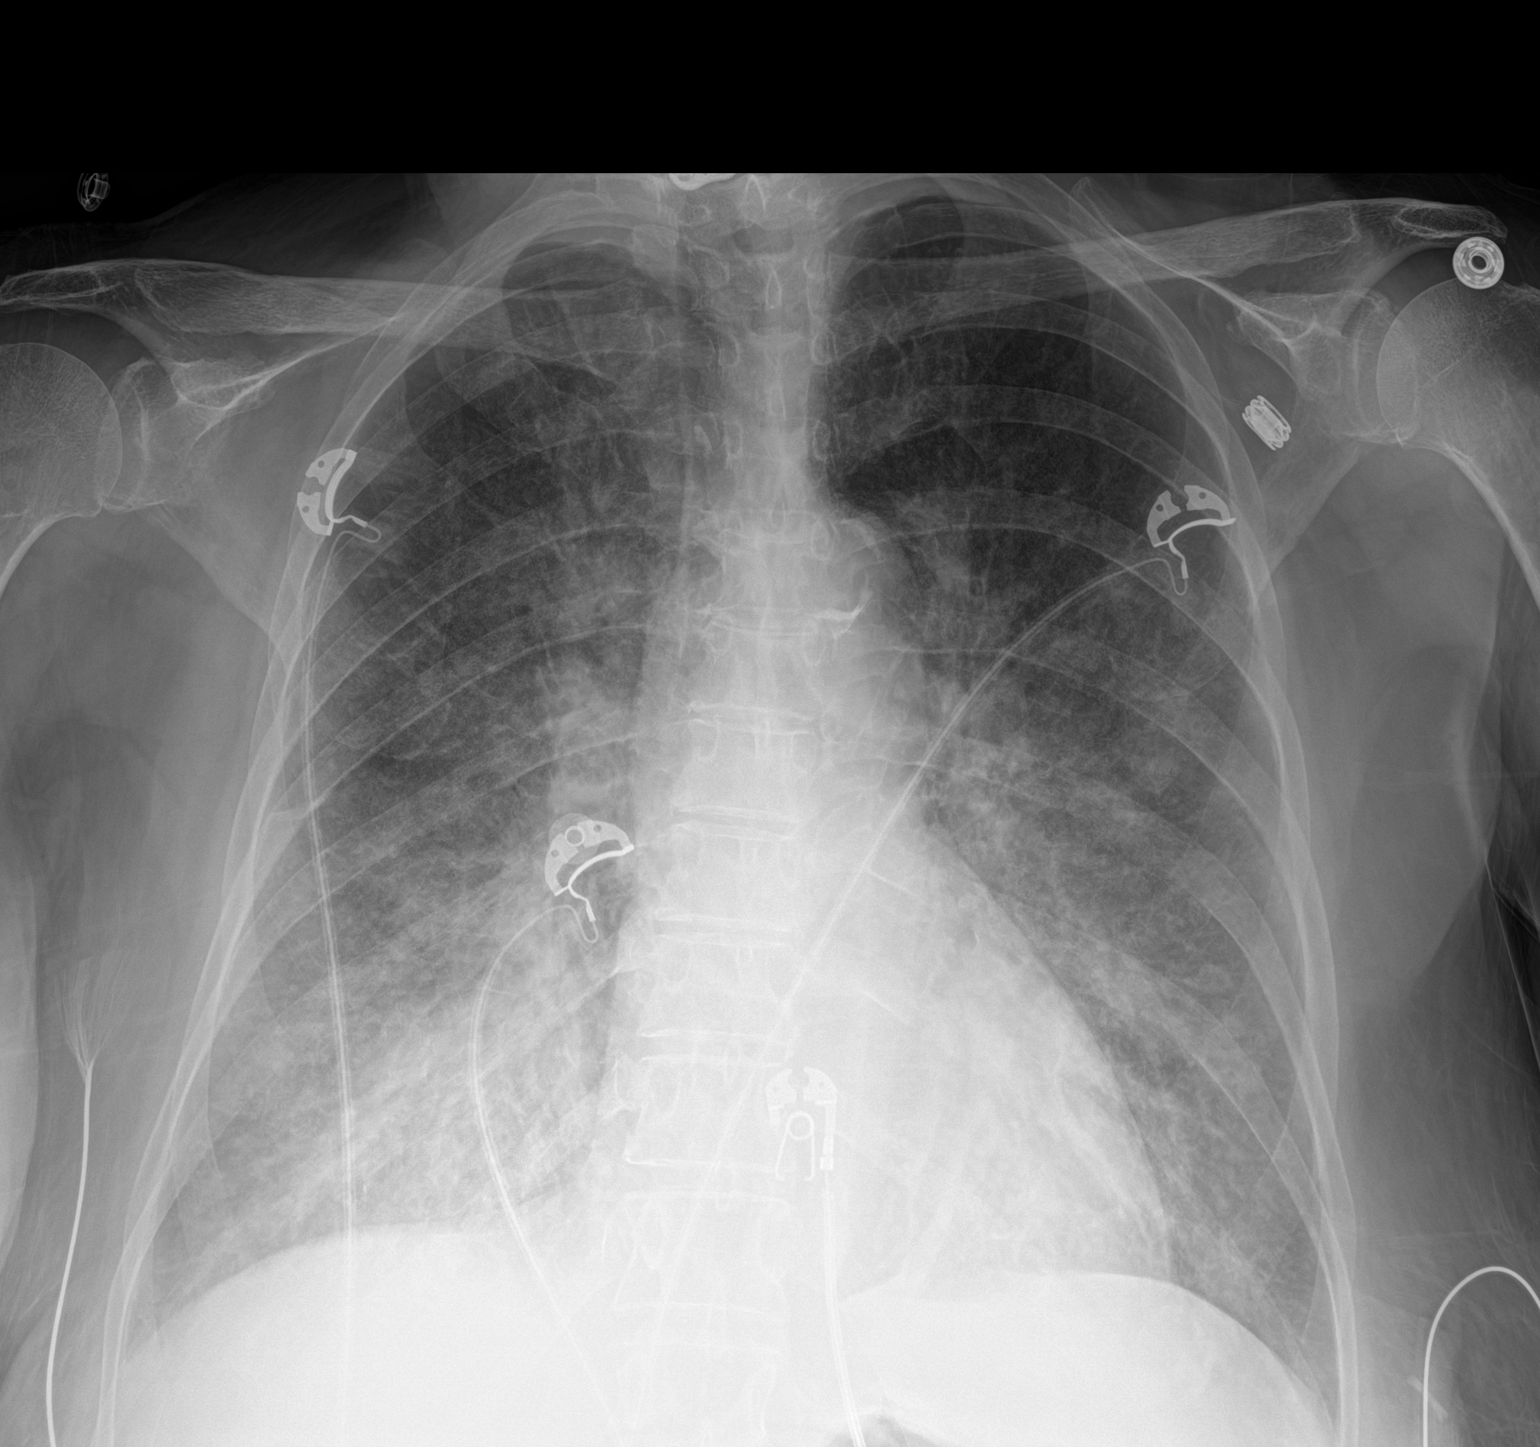

[1 of 1 positions shown; findings below may reference images not displayed]

FINDINGS: Confluent bilateral airspace disease. Stable heart size and
mediastinal contours. No visible effusion or air leak.
IMPRESSION: Confluent airspace disease, symmetric and likely CHF.

## 2022-09-03 DIAGNOSIS — Z1231 Encounter for screening mammogram for malignant neoplasm of breast: Secondary | ICD-10-CM | POA: Diagnosis not present

## 2022-09-09 ENCOUNTER — Other Ambulatory Visit: Payer: Medicare Other

## 2022-09-09 DIAGNOSIS — H6991 Unspecified Eustachian tube disorder, right ear: Secondary | ICD-10-CM | POA: Diagnosis not present

## 2022-09-10 DIAGNOSIS — M353 Polymyalgia rheumatica: Secondary | ICD-10-CM | POA: Diagnosis not present

## 2022-09-10 DIAGNOSIS — M059 Rheumatoid arthritis with rheumatoid factor, unspecified: Secondary | ICD-10-CM | POA: Diagnosis not present

## 2022-09-10 DIAGNOSIS — N183 Chronic kidney disease, stage 3 unspecified: Secondary | ICD-10-CM | POA: Diagnosis not present

## 2022-09-10 DIAGNOSIS — E1169 Type 2 diabetes mellitus with other specified complication: Secondary | ICD-10-CM | POA: Diagnosis not present

## 2022-09-10 DIAGNOSIS — D849 Immunodeficiency, unspecified: Secondary | ICD-10-CM | POA: Diagnosis not present

## 2022-09-10 DIAGNOSIS — D692 Other nonthrombocytopenic purpura: Secondary | ICD-10-CM | POA: Diagnosis not present

## 2022-09-10 DIAGNOSIS — E785 Hyperlipidemia, unspecified: Secondary | ICD-10-CM | POA: Diagnosis not present

## 2022-09-10 DIAGNOSIS — E119 Type 2 diabetes mellitus without complications: Secondary | ICD-10-CM | POA: Diagnosis not present

## 2022-09-10 DIAGNOSIS — I5022 Chronic systolic (congestive) heart failure: Secondary | ICD-10-CM | POA: Diagnosis not present

## 2022-09-10 DIAGNOSIS — Z7952 Long term (current) use of systemic steroids: Secondary | ICD-10-CM | POA: Diagnosis not present

## 2022-09-10 DIAGNOSIS — I130 Hypertensive heart and chronic kidney disease with heart failure and stage 1 through stage 4 chronic kidney disease, or unspecified chronic kidney disease: Secondary | ICD-10-CM | POA: Diagnosis not present

## 2022-09-11 DIAGNOSIS — K080 Exfoliation of teeth due to systemic causes: Secondary | ICD-10-CM | POA: Diagnosis not present

## 2022-09-13 ENCOUNTER — Other Ambulatory Visit: Payer: Medicare Other

## 2022-09-17 DIAGNOSIS — R5382 Chronic fatigue, unspecified: Secondary | ICD-10-CM | POA: Diagnosis not present

## 2022-09-17 DIAGNOSIS — Z79899 Other long term (current) drug therapy: Secondary | ICD-10-CM | POA: Diagnosis not present

## 2022-09-17 DIAGNOSIS — M353 Polymyalgia rheumatica: Secondary | ICD-10-CM | POA: Diagnosis not present

## 2022-09-17 DIAGNOSIS — M1991 Primary osteoarthritis, unspecified site: Secondary | ICD-10-CM | POA: Diagnosis not present

## 2022-09-17 DIAGNOSIS — M0579 Rheumatoid arthritis with rheumatoid factor of multiple sites without organ or systems involvement: Secondary | ICD-10-CM | POA: Diagnosis not present

## 2022-09-24 DIAGNOSIS — S72141D Displaced intertrochanteric fracture of right femur, subsequent encounter for closed fracture with routine healing: Secondary | ICD-10-CM | POA: Diagnosis not present

## 2022-09-26 DIAGNOSIS — R058 Other specified cough: Secondary | ICD-10-CM | POA: Diagnosis not present

## 2022-09-26 DIAGNOSIS — H6691 Otitis media, unspecified, right ear: Secondary | ICD-10-CM | POA: Diagnosis not present

## 2022-09-26 DIAGNOSIS — Z0000 Encounter for general adult medical examination without abnormal findings: Secondary | ICD-10-CM | POA: Diagnosis not present

## 2022-09-30 ENCOUNTER — Ambulatory Visit: Payer: Medicare Other | Admitting: Adult Health
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
## 2022-10-01 ENCOUNTER — Encounter: Payer: Self-pay | Admitting: Adult Health

## 2022-10-11 DIAGNOSIS — J029 Acute pharyngitis, unspecified: Secondary | ICD-10-CM | POA: Diagnosis not present

## 2022-10-14 ENCOUNTER — Other Ambulatory Visit: Payer: Medicare Other

## 2022-10-14 ENCOUNTER — Ambulatory Visit
Admission: RE | Admit: 2022-10-14 | Discharge: 2022-10-14 | Disposition: A | Discharge status: Home / Self Care | Payer: Medicare Other | Source: Ambulatory Visit | Attending: Family Medicine | Admitting: Family Medicine

## 2022-10-14 DIAGNOSIS — R0989 Other specified symptoms and signs involving the circulatory and respiratory systems: Secondary | ICD-10-CM | POA: Diagnosis not present

## 2022-11-05 IMAGING — CR DG CHEST 2V
2 series · 2 of 2 positions shown · non-contrast
Comparison: 06/05/2021

CLINICAL DATA: Chest pain

EXAM:
CHEST - 2 VIEW

[chest pa]
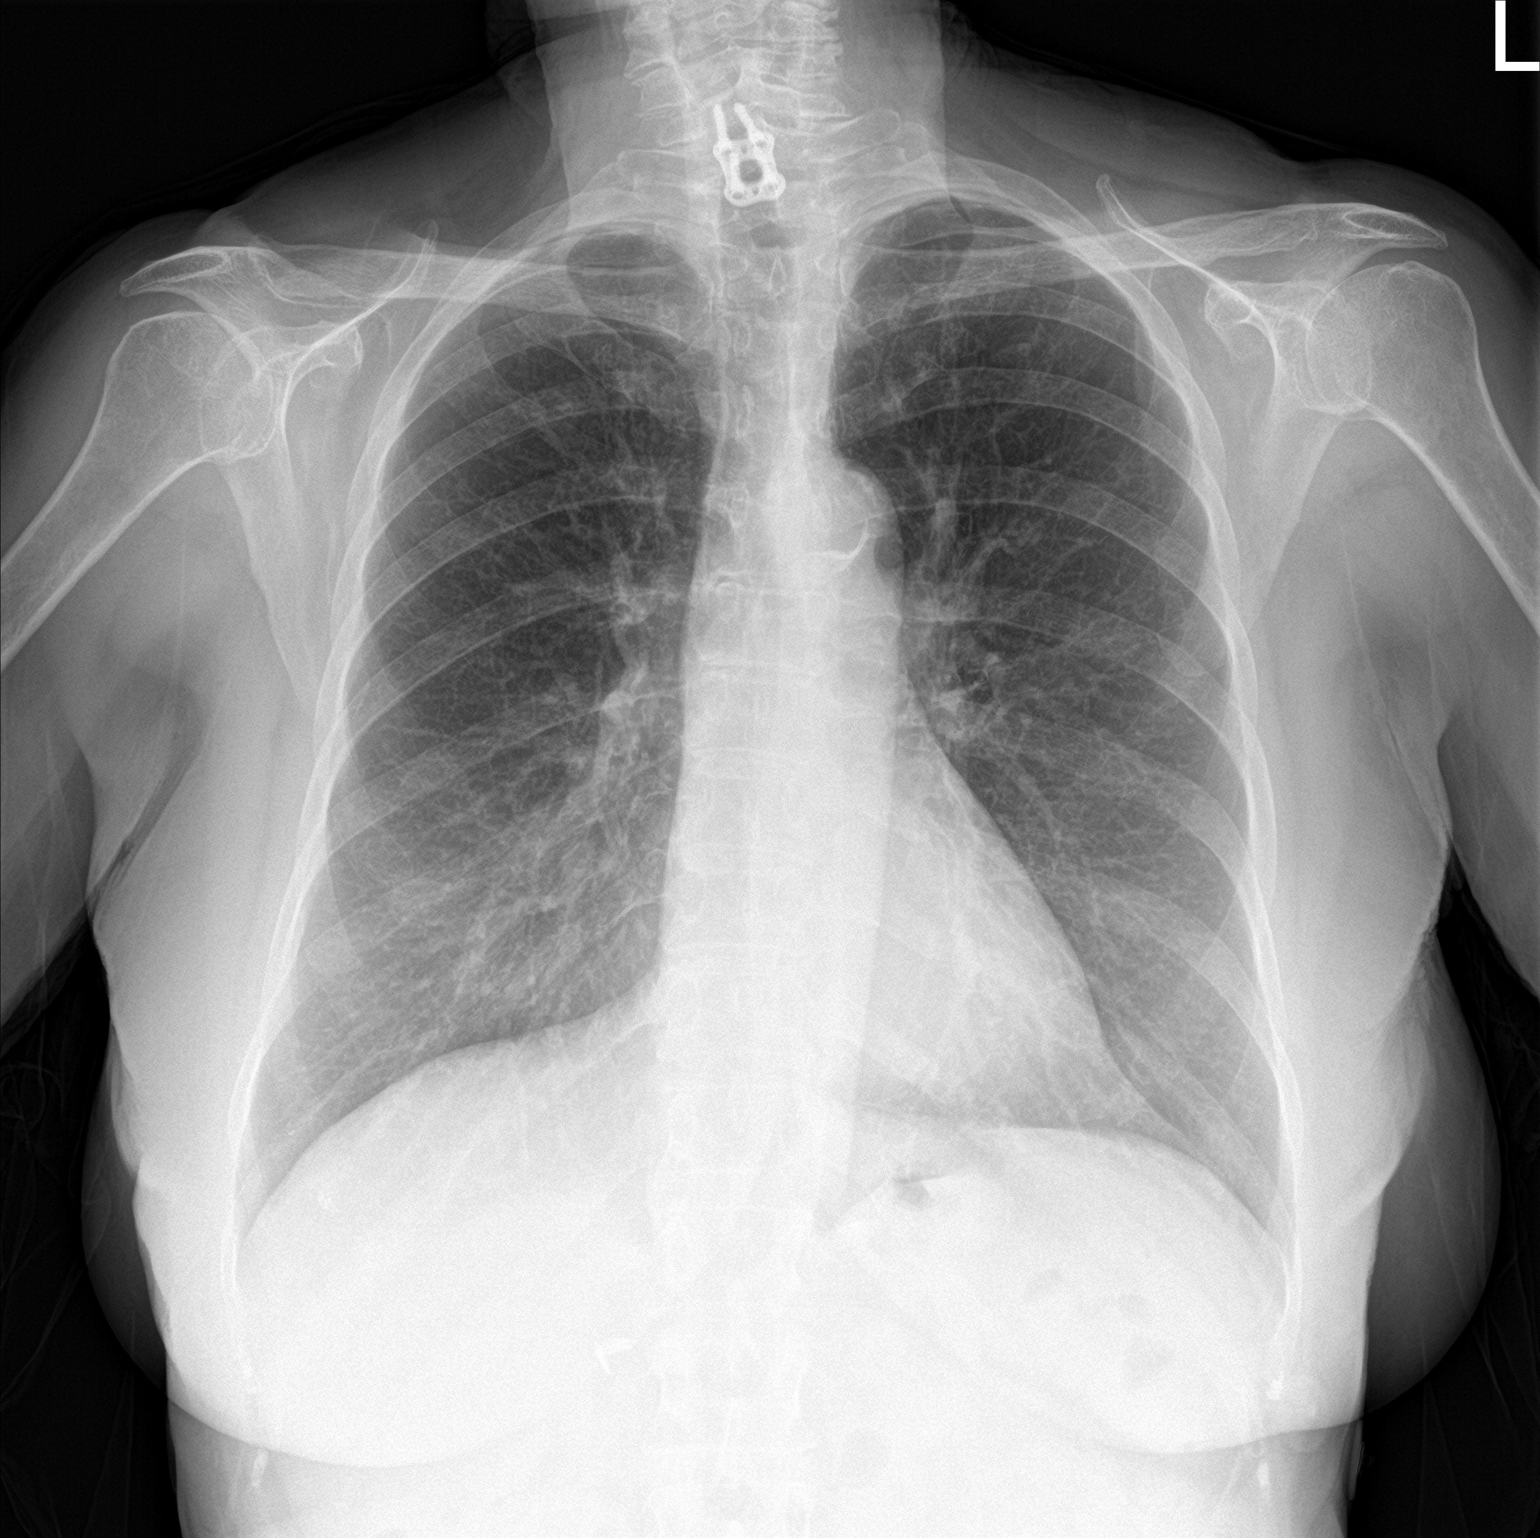

[chest lat]
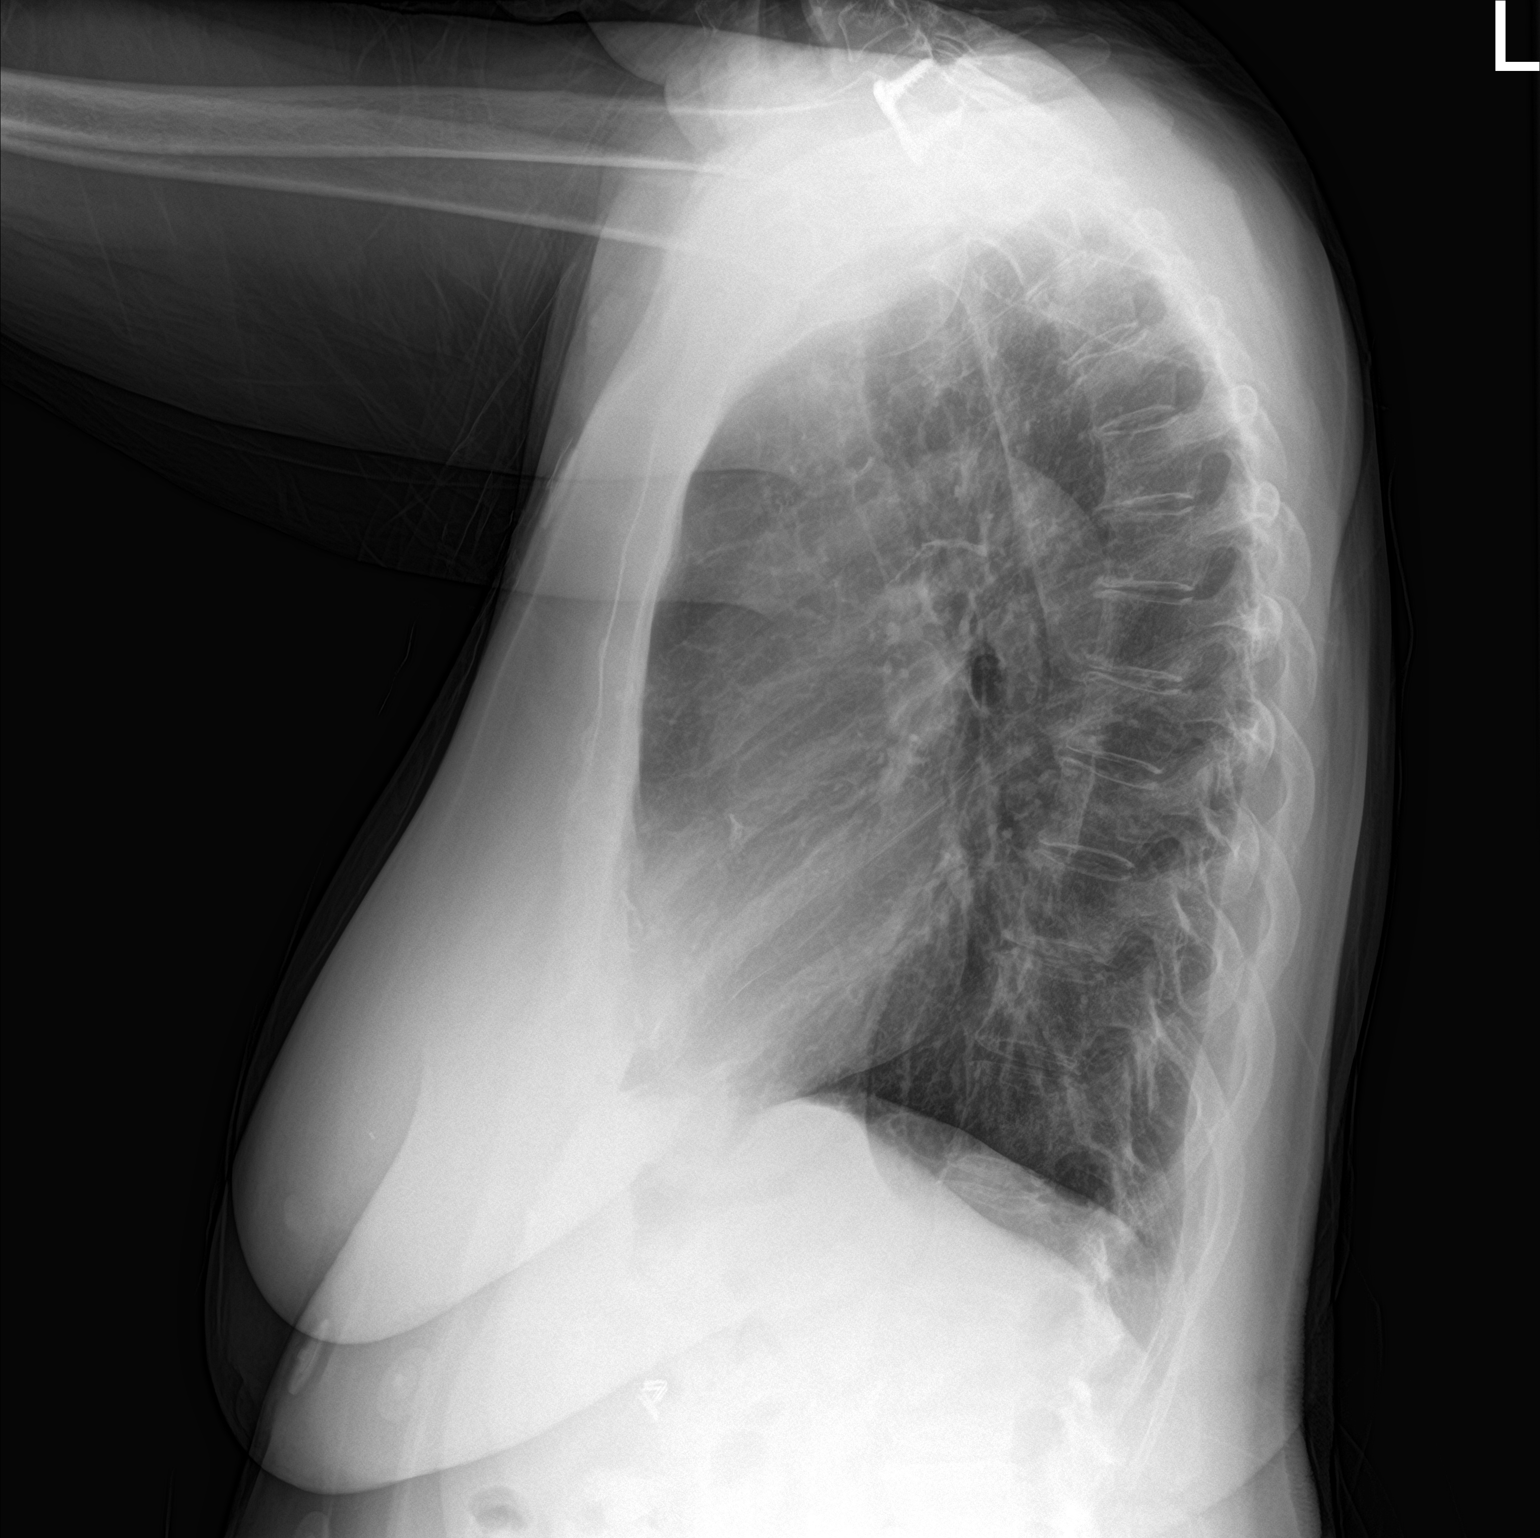

[2 of 2 positions shown; findings below may reference images not displayed]

FINDINGS: The lungs are clear without focal pneumonia, edema, pneumothorax or
pleural effusion. The cardiopericardial silhouette is within normal
limits for size. The visualized bony structures of the thorax are
unremarkable.
IMPRESSION: No active cardiopulmonary disease.

## 2022-11-11 DIAGNOSIS — N9489 Other specified conditions associated with female genital organs and menstrual cycle: Secondary | ICD-10-CM | POA: Diagnosis not present

## 2022-11-11 DIAGNOSIS — H524 Presbyopia: Secondary | ICD-10-CM | POA: Diagnosis not present

## 2022-11-20 DIAGNOSIS — H6504 Acute serous otitis media, recurrent, right ear: Secondary | ICD-10-CM | POA: Diagnosis not present

## 2022-11-20 DIAGNOSIS — H90A31 Mixed conductive and sensorineural hearing loss, unilateral, right ear with restricted hearing on the contralateral side: Secondary | ICD-10-CM | POA: Diagnosis not present

## 2022-11-20 DIAGNOSIS — H6991 Unspecified Eustachian tube disorder, right ear: Secondary | ICD-10-CM | POA: Diagnosis not present

## 2022-11-21 DIAGNOSIS — M353 Polymyalgia rheumatica: Secondary | ICD-10-CM | POA: Diagnosis not present

## 2022-11-21 DIAGNOSIS — M0579 Rheumatoid arthritis with rheumatoid factor of multiple sites without organ or systems involvement: Secondary | ICD-10-CM | POA: Diagnosis not present

## 2022-11-21 DIAGNOSIS — M1991 Primary osteoarthritis, unspecified site: Secondary | ICD-10-CM | POA: Diagnosis not present

## 2022-11-21 DIAGNOSIS — Z79899 Other long term (current) drug therapy: Secondary | ICD-10-CM | POA: Diagnosis not present

## 2022-11-25 DIAGNOSIS — N83201 Unspecified ovarian cyst, right side: Secondary | ICD-10-CM | POA: Diagnosis not present

## 2022-12-17 DIAGNOSIS — M65321 Trigger finger, right index finger: Secondary | ICD-10-CM | POA: Diagnosis not present
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
## 2023-01-06 ENCOUNTER — Ambulatory Visit: Payer: Medicare Other | Attending: Cardiology | Admitting: Cardiology

## 2023-01-06 ENCOUNTER — Encounter: Payer: Self-pay | Admitting: Cardiology

## 2023-01-06 VITALS — BP 100/62 | HR 56 | Ht 63.0 in | Wt 128.0 lb

## 2023-01-06 DIAGNOSIS — I2510 Atherosclerotic heart disease of native coronary artery without angina pectoris: Secondary | ICD-10-CM | POA: Diagnosis not present

## 2023-01-06 DIAGNOSIS — I5022 Chronic systolic (congestive) heart failure: Secondary | ICD-10-CM

## 2023-01-06 DIAGNOSIS — I5181 Takotsubo syndrome: Secondary | ICD-10-CM | POA: Diagnosis not present

## 2023-01-06 DIAGNOSIS — E7800 Pure hypercholesterolemia, unspecified: Secondary | ICD-10-CM

## 2023-01-06 MED ORDER — SIMVASTATIN 40 MG PO TABS: 40.0000 mg | ORAL_TABLET | Freq: Every day | ORAL | 3 refills | Status: AC

## 2023-01-06 NOTE — Patient Instructions (Signed)
Medication Instructions:  Stop Furosemide Stop Potassium Continue all other medications *If you need a refill on your cardiac medications before your next appointment, please call your pharmacy*   Lab Work: None ordered   Testing/Procedures: None ordered   Follow-Up: At [facility], you and your health needs are our priority.  As part of our continuing mission to provide you with exceptional heart care, we have created designated Provider Care Teams.  These Care Teams include your primary Cardiologist (physician) and Advanced Practice Providers (APPs -  Physician Assistants and Nurse Practitioners) who all work together to provide you with the care you need, when you need it.  We recommend signing up for the patient portal called "MyChart".  Sign up information is provided on this After Visit Summary.  MyChart is used to connect with patients for Virtual Visits (Telemedicine).  Patients are able to view lab/test results, encounter notes, upcoming appointments, etc.  Non-urgent messages can be sent to your provider as well.   To learn more about what you can do with MyChart, go to [email or web address].    Your next appointment:  6 months   Call in Jan to schedule March appointment     Provider:  Dr.Jordan

## 2023-01-14 DIAGNOSIS — N1832 Chronic kidney disease, stage 3b: Secondary | ICD-10-CM | POA: Diagnosis not present

## 2023-01-14 DIAGNOSIS — E785 Hyperlipidemia, unspecified: Secondary | ICD-10-CM | POA: Diagnosis not present

## 2023-01-14 DIAGNOSIS — E1122 Type 2 diabetes mellitus with diabetic chronic kidney disease: Secondary | ICD-10-CM | POA: Diagnosis not present

## 2023-01-14 DIAGNOSIS — H6993 Unspecified Eustachian tube disorder, bilateral: Secondary | ICD-10-CM | POA: Diagnosis not present

## 2023-01-14 DIAGNOSIS — I5022 Chronic systolic (congestive) heart failure: Secondary | ICD-10-CM | POA: Diagnosis not present

## 2023-01-14 DIAGNOSIS — Z23 Encounter for immunization: Secondary | ICD-10-CM | POA: Diagnosis not present

## 2023-01-14 DIAGNOSIS — M059 Rheumatoid arthritis with rheumatoid factor, unspecified: Secondary | ICD-10-CM | POA: Diagnosis not present

## 2023-01-14 DIAGNOSIS — H906 Mixed conductive and sensorineural hearing loss, bilateral: Secondary | ICD-10-CM | POA: Diagnosis not present
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
## 2023-02-03 ENCOUNTER — Other Ambulatory Visit: Payer: Self-pay

## 2023-02-03 ENCOUNTER — Encounter: Payer: Self-pay | Admitting: Internal Medicine

## 2023-02-03 ENCOUNTER — Ambulatory Visit: Payer: Medicare Other | Admitting: Internal Medicine

## 2023-02-03 VITALS — BP 112/68 | HR 58 | Temp 98.2°F | Resp 16 | Ht 62.25 in | Wt 131.8 lb

## 2023-02-03 DIAGNOSIS — L2989 Other pruritus: Secondary | ICD-10-CM | POA: Diagnosis not present

## 2023-02-03 DIAGNOSIS — L299 Pruritus, unspecified: Secondary | ICD-10-CM

## 2023-02-03 MED ORDER — GABAPENTIN 100 MG PO CAPS: ORAL_CAPSULE | ORAL | 3 refills | Status: DC

## 2023-02-03 NOTE — Patient Instructions (Addendum)
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 Thank you for allowing me to participate in your care.  Tonny Bollman, MD [facility]

## 2023-02-04 NOTE — Progress Notes (Signed)
Attempted to call patient regarding her recent labs, did not leave message as voice recorder did not have her information.  Please attempt to call patient today.  Her white blood cells (our bodies infection fighting cells) are low including neutrophils which help [place] fight off bacteria.  I do not suspect this is related to her itching, but it was found on her labs.   This can be a rare side effect from plaquenil.  I would recommend she have this lab repeated in a few weeks to determine if her levels are recovering.   This needs to be sent to both her primary care physician as well as her rheumatologist.  Please make sure they have a copy of these labs and let the patient know to return in around 2 weeks for a repeat CBCd (to monitor blood counts). Should she get sick with a fever, she should be evaluated immediately given her low blood counts.

## 2023-02-05 ENCOUNTER — Other Ambulatory Visit: Payer: Self-pay | Admitting: Internal Medicine

## 2023-02-05 DIAGNOSIS — D708 Other neutropenia: Secondary | ICD-10-CM

## 2023-02-05 LAB — CMP14+EGFR
ALT: 15 [IU]/L (ref 0–32)
AST: 20 [IU]/L (ref 0–40)
Albumin: 4.2 g/dL (ref 3.8–4.8)
Alkaline Phosphatase: 66 [IU]/L (ref 44–121)
BUN/Creatinine Ratio: 19 (ref 12–28)
BUN: 18 mg/dL (ref 8–27)
Bilirubin Total: 0.4 mg/dL (ref 0.0–1.2)
CO2: 24 mmol/L (ref 20–29)
Calcium: 9.6 mg/dL (ref 8.7–10.3)
Chloride: 104 mmol/L (ref 96–106)
Creatinine, Ser: 0.93 mg/dL (ref 0.57–1.00)
Globulin, Total: 1.7 g/dL (ref 1.5–4.5)
Glucose: 99 mg/dL (ref 70–99)
Potassium: 4.6 mmol/L (ref 3.5–5.2)
Sodium: 143 mmol/L (ref 134–144)
Total Protein: 5.9 g/dL — ABNORMAL LOW (ref 6.0–8.5)
eGFR: 63 mL/min/{1.73_m2} (ref >59)

## 2023-02-05 LAB — TSH+FREE T4
Free T4: 1.2 ng/dL (ref 0.82–1.77)
TSH: 4.22 u[IU]/mL (ref 0.450–4.500)

## 2023-02-05 LAB — CBC WITH DIFFERENTIAL/PLATELET
Basophils Absolute: 0 10*3/uL (ref 0.0–0.2)
Basos: 0 %
EOS (ABSOLUTE): 0.1 10*3/uL (ref 0.0–0.4)
Eos: 3 %
Hematocrit: 44.5 % (ref 34.0–46.6)
Hemoglobin: 14.3 g/dL (ref 11.1–15.9)
Immature Grans (Abs): 0 10*3/uL (ref 0.0–0.1)
Immature Granulocytes: 0 %
Lymphocytes Absolute: 1.3 10*3/uL (ref 0.7–3.1)
Lymphs: 55 %
MCH: 29.3 pg (ref 26.6–33.0)
MCHC: 32.1 g/dL (ref 31.5–35.7)
MCV: 91 fL (ref 79–97)
Monocytes Absolute: 0.4 10*3/uL (ref 0.1–0.9)
Monocytes: 16 %
Neutrophils Absolute: 0.6 10*3/uL — ABNORMAL LOW (ref 1.4–7.0)
Neutrophils: 26 %
Platelets: 169 10*3/uL (ref 150–450)
RBC: 4.88 x10E6/uL (ref 3.77–5.28)
RDW: 12.8 % (ref 11.7–15.4)
WBC: 2.5 10*3/uL — CL (ref 3.4–10.8)

## 2023-02-05 LAB — ALLERGENS, ZONE 2

## 2023-02-05 LAB — SEDIMENTATION RATE: Sed Rate: 2 mm/h (ref 0–40)

## 2023-02-05 NOTE — Progress Notes (Signed)
Encounter created to order repeat CBCd.  Spoke with patient and informed of results. She denies being sick recently. She denies previous history of low white blood counts. She has been on plaquenil, prescribed by her Rheumatologist.  She is going to call us back with her Rheumatologist's name so that we can send them these results.  Informed that if sick with fever, needs immediate medical attention. Patient voiced understanding.  She will return to clinic in 2 weeks for repeat CBCd. She will call to provide Rheumatologist name, clinical staff will send copy of test results (CBCd).

## 2023-02-06 ENCOUNTER — Telehealth: Payer: Self-pay | Admitting: Internal Medicine

## 2023-02-06 NOTE — Telephone Encounter (Signed)
Patient returning call to [facility] office please advise

## 2023-02-06 NOTE — Telephone Encounter (Signed)
Pt informed of results.

## 2023-02-12 DIAGNOSIS — K080 Exfoliation of teeth due to systemic causes: Secondary | ICD-10-CM | POA: Diagnosis not present

## 2023-02-14 DIAGNOSIS — H6504 Acute serous otitis media, recurrent, right ear: Secondary | ICD-10-CM | POA: Diagnosis not present

## 2023-02-14 DIAGNOSIS — H90A31 Mixed conductive and sensorineural hearing loss, unilateral, right ear with restricted hearing on the contralateral side: Secondary | ICD-10-CM | POA: Diagnosis not present

## 2023-02-14 DIAGNOSIS — H6991 Unspecified Eustachian tube disorder, right ear: Secondary | ICD-10-CM | POA: Diagnosis not present

## 2023-02-17 ENCOUNTER — Encounter: Payer: Self-pay | Admitting: Adult Health

## 2023-02-17 ENCOUNTER — Ambulatory Visit: Payer: Medicare Other | Admitting: Adult Health

## 2023-02-17 VITALS — BP 152/70 | HR 81 | Ht 63.0 in | Wt 133.0 lb

## 2023-02-17 DIAGNOSIS — G3184 Mild cognitive impairment, so stated: Secondary | ICD-10-CM | POA: Diagnosis not present

## 2023-02-17 NOTE — Patient Instructions (Addendum)
Your Plan:  Ensure you are routinely doing compensation strategies to help with your short term memory loss  Please ensure you PCP checks B12, thyroid and vit D routinely   Ensure you are sleeping well, maintaining a good diet and routinely doing both physical and cognitive exercises      Follow up in 1 year or call earlier if needed     Thank you for coming to see [place] at [facility]. I hope we have been able to provide you high quality care today.  You may receive a patient satisfaction survey over the next few weeks. We would appreciate your feedback and comments so that we may continue to improve ourselves and the health of our patients.     Memory Compensation Strategies  Use "WARM" strategy.  W= write it down  A= associate it  R= repeat it  M= make a mental note  2.   You can keep a Glass blower/designer.  Use a 3-ring notebook with sections for the following: calendar, important names and phone numbers,  medications, doctors' names/phone numbers, lists/reminders, and a section to journal what you did  each day.   3.    Use a calendar to write appointments down.  4.    Write yourself a schedule for the day.  This can be placed on the calendar or in a separate section of the Memory Notebook.  Keeping a  regular schedule can help memory.  5.    Use medication organizer with sections for each day or morning/evening pills.  You may need help loading it  6.    Keep a basket, or pegboard by the door.  Place items that you need to take out with you in the basket or on the pegboard.  You may also want to  include a message board for reminders.  7.    Use sticky notes.  Place sticky notes with reminders in a place where the task is performed.  For example: " turn off the  stove" placed by the stove, "lock the door" placed on the door at eye level, " take your medications" on  the bathroom mirror or by the place where you normally take your medications.  8.     Use alarms/timers.  Use while cooking to remind yourself to check on food or as a reminder to take your medicine, or as a  reminder to make a call, or as a reminder to perform another task, etc.
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
## 2023-02-18 NOTE — Progress Notes (Signed)
Can we call [name] to have her come in for a repeat blood draw this week?

## 2023-02-19 DIAGNOSIS — D708 Other neutropenia: Secondary | ICD-10-CM | POA: Diagnosis not present

## 2023-02-20 LAB — CBC WITH DIFFERENTIAL/PLATELET
Basophils Absolute: 0 10*3/uL (ref 0.0–0.2)
Basos: 0 %
EOS (ABSOLUTE): 0.1 10*3/uL (ref 0.0–0.4)
Eos: 2 %
Hematocrit: 43.5 % (ref 34.0–46.6)
Hemoglobin: 13.4 g/dL (ref 11.1–15.9)
Immature Grans (Abs): 0 10*3/uL (ref 0.0–0.1)
Immature Granulocytes: 0 %
Lymphocytes Absolute: 1.4 10*3/uL (ref 0.7–3.1)
Lymphs: 45 %
MCH: 28.6 pg (ref 26.6–33.0)
MCHC: 30.8 g/dL — ABNORMAL LOW (ref 31.5–35.7)
MCV: 93 fL (ref 79–97)
Monocytes Absolute: 0.5 10*3/uL (ref 0.1–0.9)
Monocytes: 17 %
Neutrophils Absolute: 1.1 10*3/uL — ABNORMAL LOW (ref 1.4–7.0)
Neutrophils: 36 %
Platelets: 148 10*3/uL — ABNORMAL LOW (ref 150–450)
RBC: 4.69 x10E6/uL (ref 3.77–5.28)
RDW: 12.8 % (ref 11.7–15.4)
WBC: 3.2 10*3/uL — ABNORMAL LOW (ref 3.4–10.8)

## 2023-02-24 NOTE — Progress Notes (Signed)
Please let Mrs. Havins know that her blood work looks much better than 3 weeks ago. I would like to repeat in about 3 months.

## 2023-02-27 DIAGNOSIS — M1991 Primary osteoarthritis, unspecified site: Secondary | ICD-10-CM | POA: Diagnosis not present

## 2023-02-27 DIAGNOSIS — M0579 Rheumatoid arthritis with rheumatoid factor of multiple sites without organ or systems involvement: Secondary | ICD-10-CM | POA: Diagnosis not present

## 2023-02-27 DIAGNOSIS — Z79899 Other long term (current) drug therapy: Secondary | ICD-10-CM | POA: Diagnosis not present

## 2023-02-27 DIAGNOSIS — M353 Polymyalgia rheumatica: Secondary | ICD-10-CM | POA: Diagnosis not present

## 2023-03-07 DIAGNOSIS — L299 Pruritus, unspecified: Secondary | ICD-10-CM | POA: Diagnosis not present

## 2023-03-07 DIAGNOSIS — I129 Hypertensive chronic kidney disease with stage 1 through stage 4 chronic kidney disease, or unspecified chronic kidney disease: Secondary | ICD-10-CM | POA: Diagnosis not present

## 2023-03-18 DIAGNOSIS — K080 Exfoliation of teeth due to systemic causes: Secondary | ICD-10-CM | POA: Diagnosis not present

## 2023-04-01 DIAGNOSIS — D72819 Decreased white blood cell count, unspecified: Secondary | ICD-10-CM | POA: Diagnosis not present

## 2023-04-02 DIAGNOSIS — M65321 Trigger finger, right index finger: Secondary | ICD-10-CM | POA: Diagnosis not present

## 2023-04-14 DIAGNOSIS — N1832 Chronic kidney disease, stage 3b: Secondary | ICD-10-CM | POA: Diagnosis not present

## 2023-04-14 DIAGNOSIS — E1122 Type 2 diabetes mellitus with diabetic chronic kidney disease: Secondary | ICD-10-CM | POA: Diagnosis not present

## 2023-04-14 DIAGNOSIS — Z0000 Encounter for general adult medical examination without abnormal findings: Secondary | ICD-10-CM | POA: Diagnosis not present

## 2023-04-14 DIAGNOSIS — E261 Secondary hyperaldosteronism: Secondary | ICD-10-CM | POA: Diagnosis not present

## 2023-04-14 DIAGNOSIS — E785 Hyperlipidemia, unspecified: Secondary | ICD-10-CM | POA: Diagnosis not present

## 2023-04-14 DIAGNOSIS — I129 Hypertensive chronic kidney disease with stage 1 through stage 4 chronic kidney disease, or unspecified chronic kidney disease: Secondary | ICD-10-CM | POA: Diagnosis not present

## 2023-04-14 DIAGNOSIS — I5022 Chronic systolic (congestive) heart failure: Secondary | ICD-10-CM | POA: Diagnosis not present

## 2023-05-02 DIAGNOSIS — M654 Radial styloid tenosynovitis [de Quervain]: Secondary | ICD-10-CM | POA: Diagnosis not present

## 2023-05-21 ENCOUNTER — Telehealth: Payer: Self-pay | Admitting: Cardiology

## 2023-05-21 MED ORDER — SACUBITRIL-VALSARTAN 97-103 MG PO TABS: 1.0000 | ORAL_TABLET | Freq: Two times a day (BID) | ORAL | 3 refills | Status: DC

## 2023-05-21 NOTE — Telephone Encounter (Signed)
*  STAT* If patient is at the pharmacy, call can be transferred to refill team.   1. Which medications need to be refilled? (please list name of each medication and dose if known)  sacubitril-valsartan (ENTRESTO) 97-103 MG  2. Which pharmacy/location (including street and city if local pharmacy) is medication to be sent to? [facility] [id number] - [place], [place] - [address]  3. Do they need a 30 day or 90 day supply?  90 day supply

## 2023-05-21 NOTE — Telephone Encounter (Signed)
done

## 2023-05-26 ENCOUNTER — Telehealth: Payer: Self-pay | Admitting: Cardiology

## 2023-05-26 MED ORDER — SACUBITRIL-VALSARTAN 97-103 MG PO TABS: 1.0000 | ORAL_TABLET | Freq: Two times a day (BID) | ORAL | 3 refills | Status: AC

## 2023-05-26 NOTE — Telephone Encounter (Signed)
*  STAT* If patient is at the pharmacy, call can be transferred to refill team.   1. Which medications need to be refilled? (please list name of each medication and dose if known)   sacubitril-valsartan (ENTRESTO) 97-103 MG    2. Which pharmacy/location (including street and city if local pharmacy) is medication to be sent to? [facility] [id number] - [place], [place] - [phone] Phone: [phone]  Fax: [phone]     3. Do they need a 30 day or 90 day supply? 90

## 2023-05-26 NOTE — Telephone Encounter (Signed)
Called patient with no answer. Left message to return call back. Prescription for Entresto resubmitted to pharmacy. Last prescriptions set to print.

## 2023-05-28 DIAGNOSIS — N83201 Unspecified ovarian cyst, right side: Secondary | ICD-10-CM | POA: Diagnosis not present

## 2023-05-29 DIAGNOSIS — M1991 Primary osteoarthritis, unspecified site: Secondary | ICD-10-CM | POA: Diagnosis not present

## 2023-05-29 DIAGNOSIS — Z79899 Other long term (current) drug therapy: Secondary | ICD-10-CM | POA: Diagnosis not present

## 2023-05-29 DIAGNOSIS — M353 Polymyalgia rheumatica: Secondary | ICD-10-CM | POA: Diagnosis not present

## 2023-05-29 DIAGNOSIS — M0579 Rheumatoid arthritis with rheumatoid factor of multiple sites without organ or systems involvement: Secondary | ICD-10-CM | POA: Diagnosis not present

## 2023-06-16 DIAGNOSIS — M654 Radial styloid tenosynovitis [de Quervain]: Secondary | ICD-10-CM | POA: Diagnosis not present
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
## 2023-07-24 ENCOUNTER — Encounter: Payer: Self-pay | Admitting: Cardiology

## 2023-07-24 ENCOUNTER — Ambulatory Visit: Payer: Medicare Other | Attending: Cardiology | Admitting: Cardiology

## 2023-07-24 VITALS — BP 146/76 | HR 65 | Ht 63.0 in | Wt 135.6 lb

## 2023-07-24 DIAGNOSIS — I5022 Chronic systolic (congestive) heart failure: Secondary | ICD-10-CM

## 2023-07-24 DIAGNOSIS — I10 Essential (primary) hypertension: Secondary | ICD-10-CM

## 2023-07-24 DIAGNOSIS — E7800 Pure hypercholesterolemia, unspecified: Secondary | ICD-10-CM | POA: Diagnosis not present

## 2023-07-24 DIAGNOSIS — I5181 Takotsubo syndrome: Secondary | ICD-10-CM | POA: Diagnosis not present

## 2023-07-24 DIAGNOSIS — I5043 Acute on chronic combined systolic (congestive) and diastolic (congestive) heart failure: Secondary | ICD-10-CM

## 2023-07-24 NOTE — Patient Instructions (Signed)
 Medication Instructions:  Continue same medications *If you need a refill on your cardiac medications before your next appointment, please call your pharmacy*   Lab Work: None ordered   Testing/Procedures: None ordered   Follow-Up: At [facility], you and your health needs are our priority.  As part of our continuing mission to provide you with exceptional heart care, we have created designated Provider Care Teams.  These Care Teams include your primary Cardiologist (physician) and Advanced Practice Providers (APPs -  Physician Assistants and Nurse Practitioners) who all work together to provide you with the care you need, when you need it.  We recommend signing up for the patient portal called "MyChart".  Sign up information is provided on this After Visit Summary.  MyChart is used to connect with patients for Virtual Visits (Telemedicine).  Patients are able to view lab/test results, encounter notes, upcoming appointments, etc.  Non-urgent messages can be sent to your provider as well.   To learn more about what you can do with MyChart, go to [email or web address].    Your next appointment:  1 year    Call in Dec to schedule March appointment     Provider:  Dr.Jordan        Check blood pressure daily Call if Systolic blood pressure greater than 130

## 2023-07-29 ENCOUNTER — Other Ambulatory Visit: Payer: Self-pay | Admitting: Cardiology

## 2023-08-19 DIAGNOSIS — N83201 Unspecified ovarian cyst, right side: Secondary | ICD-10-CM | POA: Diagnosis not present

## 2023-08-26 DIAGNOSIS — M353 Polymyalgia rheumatica: Secondary | ICD-10-CM | POA: Diagnosis not present

## 2023-08-26 DIAGNOSIS — M0579 Rheumatoid arthritis with rheumatoid factor of multiple sites without organ or systems involvement: Secondary | ICD-10-CM | POA: Diagnosis not present

## 2023-08-26 DIAGNOSIS — M1991 Primary osteoarthritis, unspecified site: Secondary | ICD-10-CM | POA: Diagnosis not present

## 2023-08-26 DIAGNOSIS — Z79899 Other long term (current) drug therapy: Secondary | ICD-10-CM | POA: Diagnosis not present

## 2023-08-27 DIAGNOSIS — M65321 Trigger finger, right index finger: Secondary | ICD-10-CM | POA: Diagnosis not present

## 2023-08-27 DIAGNOSIS — M654 Radial styloid tenosynovitis [de Quervain]: Secondary | ICD-10-CM | POA: Diagnosis not present

## 2023-09-12 DIAGNOSIS — M65321 Trigger finger, right index finger: Secondary | ICD-10-CM | POA: Diagnosis not present

## 2023-09-12 DIAGNOSIS — M654 Radial styloid tenosynovitis [de Quervain]: Secondary | ICD-10-CM | POA: Diagnosis not present

## 2023-09-19 DIAGNOSIS — I5042 Chronic combined systolic (congestive) and diastolic (congestive) heart failure: Secondary | ICD-10-CM | POA: Diagnosis not present

## 2023-09-19 DIAGNOSIS — I129 Hypertensive chronic kidney disease with stage 1 through stage 4 chronic kidney disease, or unspecified chronic kidney disease: Secondary | ICD-10-CM | POA: Diagnosis not present

## 2023-09-19 DIAGNOSIS — I5022 Chronic systolic (congestive) heart failure: Secondary | ICD-10-CM | POA: Diagnosis not present

## 2023-09-19 DIAGNOSIS — E1122 Type 2 diabetes mellitus with diabetic chronic kidney disease: Secondary | ICD-10-CM | POA: Diagnosis not present

## 2023-10-13 DIAGNOSIS — N1832 Chronic kidney disease, stage 3b: Secondary | ICD-10-CM | POA: Diagnosis not present

## 2023-10-13 DIAGNOSIS — E785 Hyperlipidemia, unspecified: Secondary | ICD-10-CM | POA: Diagnosis not present

## 2023-10-13 DIAGNOSIS — E559 Vitamin D deficiency, unspecified: Secondary | ICD-10-CM | POA: Diagnosis not present

## 2023-10-13 DIAGNOSIS — I129 Hypertensive chronic kidney disease with stage 1 through stage 4 chronic kidney disease, or unspecified chronic kidney disease: Secondary | ICD-10-CM | POA: Diagnosis not present

## 2023-10-13 DIAGNOSIS — E1122 Type 2 diabetes mellitus with diabetic chronic kidney disease: Secondary | ICD-10-CM | POA: Diagnosis not present

## 2023-10-15 ENCOUNTER — Telehealth: Payer: Self-pay | Admitting: Cardiology

## 2023-10-15 NOTE — Telephone Encounter (Signed)
 Called patient no answer.

## 2023-10-15 NOTE — Telephone Encounter (Signed)
 Spoke with [employer]. She states patient BP has been running high so they increased his metoprolol  to 200 mg daily and wanted to make you aware

## 2023-10-15 NOTE — Telephone Encounter (Signed)
 Pt c/o medication issue:  1. Name of Medication:   metoprolol  succinate (TOPROL -XL) 100 MG 24 hr tablet   2. How are you currently taking this medication (dosage and times per day)?   3. Are you having a reaction (difficulty breathing--STAT)?   4. What is your medication issue?    Caller Raguel Bunkers) stated patient PCP (Dr. Camilo Cella) noted patient BP has been high and she is increasing patient's metoprolol  succinate (TOPROL -XL) 100 MG 24 hr tablet to 200 mg and have patient return to see her in 2 weeks.  Caller provider patient's BP readings for today was 180/78 HR 63 and 171/78  HR 63.

## 2023-10-17 DIAGNOSIS — M069 Rheumatoid arthritis, unspecified: Secondary | ICD-10-CM | POA: Diagnosis not present

## 2023-10-17 DIAGNOSIS — Z7952 Long term (current) use of systemic steroids: Secondary | ICD-10-CM | POA: Diagnosis not present

## 2023-10-17 DIAGNOSIS — M8588 Other specified disorders of bone density and structure, other site: Secondary | ICD-10-CM | POA: Diagnosis not present

## 2023-10-17 DIAGNOSIS — Z1231 Encounter for screening mammogram for malignant neoplasm of breast: Secondary | ICD-10-CM | POA: Diagnosis not present

## 2023-10-20 NOTE — Telephone Encounter (Signed)
 Called patient no answer.Called patient's daughter Leretha left message on personal voice mail to call back.

## 2023-10-27 DIAGNOSIS — M069 Rheumatoid arthritis, unspecified: Secondary | ICD-10-CM | POA: Diagnosis not present

## 2023-10-27 DIAGNOSIS — E119 Type 2 diabetes mellitus without complications: Secondary | ICD-10-CM | POA: Diagnosis not present

## 2023-10-27 DIAGNOSIS — I5022 Chronic systolic (congestive) heart failure: Secondary | ICD-10-CM | POA: Diagnosis not present

## 2023-10-27 DIAGNOSIS — I2510 Atherosclerotic heart disease of native coronary artery without angina pectoris: Secondary | ICD-10-CM | POA: Diagnosis not present

## 2023-10-28 ENCOUNTER — Telehealth: Payer: Self-pay | Admitting: Cardiology

## 2023-10-28 NOTE — Telephone Encounter (Signed)
 Spoke with Geni from [facility] to get clarification on if the original phone call was just to update our office or if they needed us  to verify this change was okay to make. Geni stated they wanted Dr. [place] to review and see if he was agreeable to this plan and then to call their office back. Odette Geni that I will forward this to Dr. [place] to review and his nurse and we will call their office back to let them know his response. Her direct number to call back is [phone].

## 2023-10-28 NOTE — Telephone Encounter (Signed)
 Spoke to Proctor with [facility] Dr.Jordan advised ok to increase Aldactone  to 25 mg daily.Advised I have been unable to reach patient at both numbers listed in chart.Geni stated she will call patient.

## 2023-10-28 NOTE — Telephone Encounter (Signed)
 Pt c/o medication issue:  1. Name of Medication:   spironolactone  (ALDACTONE ) 25 MG tablet   2. How are you currently taking this medication (dosage and times per day)?   3. Are you having a reaction (difficulty breathing--STAT)?   4. What is your medication issue?    Caller [place]) reports patient BP has been trending high and stated PCP wants to increase patient's medication to 1 tablet daily.  Caller reported the following patient's BP readings taken yesterday (6/30)  155/67  HR 56 155/70  HR 55 154/69  HR 54

## 2023-10-28 NOTE — Telephone Encounter (Signed)
 Pt c/o BP issue: STAT if pt c/o blurred vision, one-sided weakness or slurred speech.  STAT if BP is GREATER than 180/120 TODAY.  STAT if BP is LESS than 90/60 and SYMPTOMATIC TODAY  1. What is your BP concern?   Patient is concerned about her increasing high BP readings  2. Have you taken any BP medication today?  Yes  3. What are your last 5 BP readings?  (See PCP note)  4. Are you having any other symptoms (ex. Dizziness, headache, blurred vision, passed out)?   Headache (yesterday, not today)   Patient called to follow-up on her PCP's request for BP medication change.

## 2023-10-28 NOTE — Telephone Encounter (Signed)
Returned call to patient no answer.Unable to leave a message voicemail full. °

## 2023-11-03 NOTE — Telephone Encounter (Signed)
 Spoke to Cedar at [facility] 7/3 Dr.Jordan's advice given.

## 2023-11-10 DIAGNOSIS — M25532 Pain in left wrist: Secondary | ICD-10-CM | POA: Diagnosis not present

## 2023-11-10 DIAGNOSIS — M810 Age-related osteoporosis without current pathological fracture: Secondary | ICD-10-CM | POA: Diagnosis not present

## 2023-11-16 DIAGNOSIS — E1122 Type 2 diabetes mellitus with diabetic chronic kidney disease: Secondary | ICD-10-CM | POA: Diagnosis not present

## 2023-11-16 DIAGNOSIS — I129 Hypertensive chronic kidney disease with stage 1 through stage 4 chronic kidney disease, or unspecified chronic kidney disease: Secondary | ICD-10-CM | POA: Diagnosis not present

## 2023-11-16 DIAGNOSIS — I5022 Chronic systolic (congestive) heart failure: Secondary | ICD-10-CM | POA: Diagnosis not present

## 2023-11-16 DIAGNOSIS — I5042 Chronic combined systolic (congestive) and diastolic (congestive) heart failure: Secondary | ICD-10-CM | POA: Diagnosis not present

## 2023-11-21 ENCOUNTER — Telehealth: Payer: Self-pay | Admitting: *Deleted

## 2023-11-21 DIAGNOSIS — M65321 Trigger finger, right index finger: Secondary | ICD-10-CM | POA: Diagnosis not present

## 2023-11-21 DIAGNOSIS — M654 Radial styloid tenosynovitis [de Quervain]: Secondary | ICD-10-CM | POA: Diagnosis not present

## 2023-11-21 NOTE — Telephone Encounter (Signed)
   Name: [name]  DOB: 1945-07-24  MRN: [id number]  Primary Cardiologist: Peter [place], MD   Preoperative team, please contact this patient and set up a phone call appointment for further preoperative risk assessment. Please obtain consent and complete medication review. Thank you for your help. Last seen by Dr. [place] on 07/24/2023, without follow up pending.  I confirm that guidance regarding antiplatelet and oral anticoagulation therapy has been completed and, if necessary, noted below.  Per office protocol, if patient is without any new symptoms or concerns at the time of their virtual visit, he/she may hold ASA for 7 days prior to procedure. Please resume ASA as soon as possible postprocedure, at the discretion of the surgeon.    I also confirmed the patient resides in the state of [place] . As per [facility] Medical Board telemedicine laws, the patient must reside in the state in which the provider is licensed.   Lamarr Satterfield, NP 11/21/2023, 2:44 PM [facility]

## 2023-11-21 NOTE — Telephone Encounter (Signed)
   Pre-operative Risk Assessment    Patient Name: [name]  DOB: [birth year] MRN: [id number]   Date of last office visit: 07/24/23 DR. SWAZILAND Date of next office visit: NONE   Request for Surgical Clearance    Procedure:  RIGHT INDEX FINGER TRIGGER RELEASE  Date of Surgery:  Clearance 11/27/23                                Surgeon:  DR. EVALENE CHANCY Surgeon's Group or Practice Name:  [facility] Phone number:  [phone] [address] Fax number:  [phone]   Type of Clearance Requested:   - Medical  - Pharmacy:  Hold Aspirin      Type of Anesthesia:  CHOICE   Additional requests/questions:    Bonney Niels Jest   11/21/2023, 2:38 PM

## 2023-11-21 NOTE — Telephone Encounter (Signed)
 Tried to call the pt # [phone] but vm is full. Tried the other # on file [phone] but not valid # per recording.   I will update the requesting office the pt needs to call our office to schedule a tele preop appt.

## 2023-11-24 NOTE — Telephone Encounter (Signed)
 Front desk supervisor Emerick Hopping sent me a secure chat and said the pt walked in to schedule tele preop appt, see previous notes. I was unfortunately with another pt and was not able to go up front to s/w the pt. Mollie graciously scheduled the pt a tele preop appt 11/26/23.   I did also s/w the surgery scheduler Burnard High about ASA hold. Burnard states she informed the pt last week to hold x 1 week.   I will need to try and reach the pt later today to go over her medications in time for the preop appt. I did ask Mollie to please confirm a good ph# to reach the as her vm is full and we cannot reach her.   I will update the requesting office as well.

## 2023-11-24 NOTE — Telephone Encounter (Signed)
 Tried to call the pt back from her call a few minutes ago; but could not leave a message due to vm is full.

## 2023-11-24 NOTE — Telephone Encounter (Signed)
 I called pt's husband (DPR) and he said pt call back this morning and said she could not get through to anyone. I stated she s/w the operator but I was on another the phone with another pt. I stated I handle a lot of surgery request. I did tell the pt's husband that I have tried x 2 since she called and keep getting her vm though vm full, cannot leave message to call back. He asked if I would like for him to call her and tell her to call our office. I said that would be great. I did tell him to let her know when she calls back and the operator sends me a message she is calling back that if I am with another pt to just hang on the line and not to hang up, he said he will tell her.

## 2023-11-24 NOTE — Telephone Encounter (Signed)
 Patient returned Pre-op call to schedule a tele Pre-op appointment.  Patient noted her surgery is scheduled for 7/31.

## 2023-11-26 ENCOUNTER — Ambulatory Visit: Attending: Cardiology | Admitting: Student

## 2023-11-26 DIAGNOSIS — Z01810 Encounter for preprocedural cardiovascular examination: Secondary | ICD-10-CM
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
## 2023-11-27 DIAGNOSIS — I5042 Chronic combined systolic (congestive) and diastolic (congestive) heart failure: Secondary | ICD-10-CM | POA: Diagnosis not present

## 2023-11-27 DIAGNOSIS — M65322 Trigger finger, left index finger: Secondary | ICD-10-CM | POA: Diagnosis not present

## 2023-11-27 DIAGNOSIS — M65321 Trigger finger, right index finger: Secondary | ICD-10-CM | POA: Diagnosis not present

## 2023-11-27 DIAGNOSIS — M069 Rheumatoid arthritis, unspecified: Secondary | ICD-10-CM | POA: Diagnosis not present

## 2023-11-27 DIAGNOSIS — I5022 Chronic systolic (congestive) heart failure: Secondary | ICD-10-CM | POA: Diagnosis not present

## 2023-11-27 DIAGNOSIS — E119 Type 2 diabetes mellitus without complications: Secondary | ICD-10-CM | POA: Diagnosis not present

## 2023-11-27 DIAGNOSIS — I129 Hypertensive chronic kidney disease with stage 1 through stage 4 chronic kidney disease, or unspecified chronic kidney disease: Secondary | ICD-10-CM | POA: Diagnosis not present

## 2023-11-27 DIAGNOSIS — E1122 Type 2 diabetes mellitus with diabetic chronic kidney disease: Secondary | ICD-10-CM | POA: Diagnosis not present

## 2023-11-27 DIAGNOSIS — I2510 Atherosclerotic heart disease of native coronary artery without angina pectoris: Secondary | ICD-10-CM | POA: Diagnosis not present

## 2023-12-03 DIAGNOSIS — H43813 Vitreous degeneration, bilateral: Secondary | ICD-10-CM | POA: Diagnosis not present

## 2023-12-03 DIAGNOSIS — H26491 Other secondary cataract, right eye: Secondary | ICD-10-CM | POA: Diagnosis not present

## 2023-12-03 DIAGNOSIS — Z79899 Other long term (current) drug therapy: Secondary | ICD-10-CM | POA: Diagnosis not present

## 2023-12-03 DIAGNOSIS — H04123 Dry eye syndrome of bilateral lacrimal glands: Secondary | ICD-10-CM | POA: Diagnosis not present

## 2023-12-03 DIAGNOSIS — E119 Type 2 diabetes mellitus without complications: Secondary | ICD-10-CM | POA: Diagnosis not present

## 2023-12-05 DIAGNOSIS — K080 Exfoliation of teeth due to systemic causes: Secondary | ICD-10-CM | POA: Diagnosis not present

## 2023-12-08 DIAGNOSIS — M65321 Trigger finger, right index finger: Secondary | ICD-10-CM | POA: Diagnosis not present

## 2023-12-08 DIAGNOSIS — L2989 Other pruritus: Secondary | ICD-10-CM | POA: Diagnosis not present

## 2023-12-15 DIAGNOSIS — M353 Polymyalgia rheumatica: Secondary | ICD-10-CM | POA: Diagnosis not present

## 2023-12-15 DIAGNOSIS — Z79899 Other long term (current) drug therapy: Secondary | ICD-10-CM | POA: Diagnosis not present

## 2023-12-15 DIAGNOSIS — R5382 Chronic fatigue, unspecified: Secondary | ICD-10-CM | POA: Diagnosis not present

## 2023-12-15 DIAGNOSIS — M0579 Rheumatoid arthritis with rheumatoid factor of multiple sites without organ or systems involvement: Secondary | ICD-10-CM | POA: Diagnosis not present

## 2023-12-15 DIAGNOSIS — M1991 Primary osteoarthritis, unspecified site: Secondary | ICD-10-CM | POA: Diagnosis not present

## 2023-12-16 DIAGNOSIS — I5022 Chronic systolic (congestive) heart failure: Secondary | ICD-10-CM | POA: Diagnosis not present

## 2023-12-16 DIAGNOSIS — E1122 Type 2 diabetes mellitus with diabetic chronic kidney disease: Secondary | ICD-10-CM | POA: Diagnosis not present

## 2023-12-16 DIAGNOSIS — I5042 Chronic combined systolic (congestive) and diastolic (congestive) heart failure: Secondary | ICD-10-CM | POA: Diagnosis not present

## 2023-12-16 DIAGNOSIS — I129 Hypertensive chronic kidney disease with stage 1 through stage 4 chronic kidney disease, or unspecified chronic kidney disease: Secondary | ICD-10-CM | POA: Diagnosis not present

## 2023-12-28 DIAGNOSIS — I5042 Chronic combined systolic (congestive) and diastolic (congestive) heart failure: Secondary | ICD-10-CM | POA: Diagnosis not present

## 2023-12-28 DIAGNOSIS — E1122 Type 2 diabetes mellitus with diabetic chronic kidney disease: Secondary | ICD-10-CM | POA: Diagnosis not present

## 2023-12-28 DIAGNOSIS — I2510 Atherosclerotic heart disease of native coronary artery without angina pectoris: Secondary | ICD-10-CM | POA: Diagnosis not present

## 2023-12-28 DIAGNOSIS — I129 Hypertensive chronic kidney disease with stage 1 through stage 4 chronic kidney disease, or unspecified chronic kidney disease: Secondary | ICD-10-CM | POA: Diagnosis not present

## 2023-12-28 DIAGNOSIS — I5022 Chronic systolic (congestive) heart failure: Secondary | ICD-10-CM | POA: Diagnosis not present

## 2023-12-28 DIAGNOSIS — E119 Type 2 diabetes mellitus without complications: Secondary | ICD-10-CM | POA: Diagnosis not present

## 2023-12-28 DIAGNOSIS — M069 Rheumatoid arthritis, unspecified: Secondary | ICD-10-CM | POA: Diagnosis not present

## 2024-01-05 DIAGNOSIS — M654 Radial styloid tenosynovitis [de Quervain]: Secondary | ICD-10-CM | POA: Diagnosis not present

## 2024-01-13 ENCOUNTER — Emergency Department (HOSPITAL_COMMUNITY)

## 2024-01-13 ENCOUNTER — Emergency Department (HOSPITAL_COMMUNITY)
Admission: EM | Admit: 2024-01-13 | Discharge: 2024-01-14 | Disposition: A | Discharge status: Home / Self Care | Attending: Emergency Medicine | Admitting: Emergency Medicine

## 2024-01-13 ENCOUNTER — Encounter (HOSPITAL_COMMUNITY): Payer: Self-pay

## 2024-01-13 DIAGNOSIS — Z7982 Long term (current) use of aspirin: Secondary | ICD-10-CM | POA: Insufficient documentation

## 2024-01-13 DIAGNOSIS — T07XXXA Unspecified multiple injuries, initial encounter: Secondary | ICD-10-CM

## 2024-01-13 DIAGNOSIS — Z7984 Long term (current) use of oral hypoglycemic drugs: Secondary | ICD-10-CM | POA: Diagnosis not present

## 2024-01-13 DIAGNOSIS — W101XXA Fall (on)(from) sidewalk curb, initial encounter: Secondary | ICD-10-CM | POA: Insufficient documentation

## 2024-01-13 DIAGNOSIS — Y92480 Sidewalk as the place of occurrence of the external cause: Secondary | ICD-10-CM | POA: Diagnosis not present

## 2024-01-13 DIAGNOSIS — S60512A Abrasion of left hand, initial encounter: Secondary | ICD-10-CM | POA: Diagnosis not present

## 2024-01-13 DIAGNOSIS — S0083XA Contusion of other part of head, initial encounter: Secondary | ICD-10-CM | POA: Diagnosis not present

## 2024-01-13 DIAGNOSIS — Z79899 Other long term (current) drug therapy: Secondary | ICD-10-CM | POA: Diagnosis not present

## 2024-01-13 DIAGNOSIS — E119 Type 2 diabetes mellitus without complications: Secondary | ICD-10-CM | POA: Diagnosis not present

## 2024-01-13 DIAGNOSIS — W19XXXA Unspecified fall, initial encounter: Secondary | ICD-10-CM

## 2024-01-13 DIAGNOSIS — S80211A Abrasion, right knee, initial encounter: Secondary | ICD-10-CM | POA: Diagnosis not present

## 2024-01-13 DIAGNOSIS — S0993XA Unspecified injury of face, initial encounter: Secondary | ICD-10-CM | POA: Diagnosis not present

## 2024-01-13 DIAGNOSIS — I2510 Atherosclerotic heart disease of native coronary artery without angina pectoris: Secondary | ICD-10-CM | POA: Diagnosis not present

## 2024-01-13 DIAGNOSIS — Z981 Arthrodesis status: Secondary | ICD-10-CM | POA: Diagnosis not present

## 2024-01-13 DIAGNOSIS — Z8673 Personal history of transient ischemic attack (TIA), and cerebral infarction without residual deficits: Secondary | ICD-10-CM | POA: Insufficient documentation

## 2024-01-13 DIAGNOSIS — S199XXA Unspecified injury of neck, initial encounter: Secondary | ICD-10-CM | POA: Diagnosis not present

## 2024-01-13 DIAGNOSIS — I110 Hypertensive heart disease with heart failure: Secondary | ICD-10-CM | POA: Diagnosis not present

## 2024-01-13 DIAGNOSIS — S0081XA Abrasion of other part of head, initial encounter: Secondary | ICD-10-CM | POA: Diagnosis not present

## 2024-01-13 DIAGNOSIS — I509 Heart failure, unspecified: Secondary | ICD-10-CM | POA: Insufficient documentation

## 2024-01-13 NOTE — ED Triage Notes (Signed)
 Pt states that she was walking and tripped on the sidewalk and hit her face on sidewalk, no has a headache and swelling to L side of face

## 2024-01-14 NOTE — ED Provider Notes (Signed)
 [place] [facility] AT [facility] Provider Note   CSN: [id number] Arrival date & time: 01/13/24  2010     Patient presents with: [name] is a 78 y.o. female.  Fall  Patient is an 78 year old female presenting ED today for concerns for having had a mechanical fall earlier today while walking on the sidewalk, tripped on uneven pavement and landed on left palm, hit her head on left side of face on the ground.  Reports that she did not lose consciousness, denies being on blood thinners, has had no nausea or vomiting since the fall, and has reported that she has ambulated since the fall.  Pres medical history of diabetes, arthritis, HLD, HTN, CAD, Takotsubo cardiomyopathy, heart failure, TIA  Noted to have abrasions to left palm and right knee.  But otherwise has walked 2 blocks since the fall without any reported issues.  No visual changes.  Denies fever, headache, blurry vision, vertigo, tinnitus, unilateral weakness, chest pain, shortness of breath, abdominal pain, nausea, vomiting, diarrhea, dysuria, lower leg swelling.     Prior to Admission medications   Medication Sig Start Date End Date Taking? Authorizing Provider  acetaminophen  (TYLENOL ) 500 MG tablet Take 1,000 mg by mouth every 6 (six) hours as needed for moderate pain, mild pain or headache.    [provider]  aspirin  (ASPIRIN  CHILDRENS) 81 MG chewable tablet Chew 1 tablet (81 mg total) by mouth daily. 05/22/21   Ladona Heinz, MD  cetirizine (ZYRTEC) 10 MG tablet Take 10 mg by mouth daily.    [provider]  Cholecalciferol  (VITAMIN D3 PO) Take 1 tablet by mouth every evening.    [provider]  docusate sodium  (COLACE) 100 MG capsule Take 1 capsule (100 mg total) by mouth 2 (two) times daily. Patient not taking: Reported on 07/24/2023 07/02/22   [place], Camellia PARAS, DO  ezetimibe  (ZETIA ) 10 MG tablet Take 10 mg by mouth daily.    [provider]   hydrOXYzine  (ATARAX ) 25 MG tablet Take 25 mg by mouth at bedtime as needed for itching (Sleep).    [provider]  metFORMIN (GLUCOPHAGE-XR) 500 MG 24 hr tablet Take 1,000 mg by mouth daily. 01/25/15   [provider]  metoprolol  succinate (TOPROL -XL) 100 MG 24 hr tablet Take 1 tablet (100 mg total) by mouth daily. Take with or immediately following a meal. 11/14/21   [place], Peter M, MD  Omega-3 Fatty Acids (FISH OIL) 1000 MG CAPS Take 1,000 mg by mouth every evening.    [provider]  sacubitril -valsartan  (ENTRESTO ) 97-103 MG Take 1 tablet by mouth 2 (two) times daily. 05/26/23   [place], Peter M, MD  simvastatin  (ZOCOR ) 40 MG tablet Take 1 tablet (40 mg total) by mouth daily at 6 PM. 01/06/23   [place], Peter M, MD  spironolactone  (ALDACTONE ) 25 MG tablet Take 1 tablet (25 mg total) by mouth daily. 10/28/23   [place], Peter M, MD  vitamin B-12 (CYANOCOBALAMIN) 1000 MCG tablet Take 1,000 mcg by mouth every evening.    [provider]  zinc gluconate 50 MG tablet Take 50 mg by mouth every evening.    [provider]    Allergies: Plavix  [clopidogrel ], Crestor [rosuvastatin calcium], and Lipitor [atorvastatin calcium]    Review of Systems  Musculoskeletal:  Positive for myalgias.  All other systems reviewed and are negative.   Updated Vital Signs BP (!) 157/65   Pulse 63   Temp 98.5 F (36.9  C)   Resp 18   SpO2 96%   Physical Exam Vitals and nursing note reviewed.  Constitutional:      General: She is not in acute distress.    Appearance: Normal appearance. She is not ill-appearing or diaphoretic.  HENT:     Head: Normocephalic.     Comments: Noted to have superficial abrasions to the left side of face, forehead.    Nose: Nose normal. No congestion or rhinorrhea.     Mouth/Throat:     Mouth: Mucous membranes are moist.     Pharynx: Oropharynx is clear. No oropharyngeal exudate or posterior oropharyngeal erythema.  Eyes:     General: No  scleral icterus.       Right eye: No discharge.        Left eye: No discharge.     Extraocular Movements: Extraocular movements intact.     Conjunctiva/sclera: Conjunctivae normal.  Cardiovascular:     Rate and Rhythm: Normal rate and regular rhythm.     Pulses: Normal pulses.     Heart sounds: Normal heart sounds. No murmur heard.    No friction rub. No gallop.  Pulmonary:     Effort: Pulmonary effort is normal. No respiratory distress.     Breath sounds: No stridor. No wheezing, rhonchi or rales.  Chest:     Chest wall: No tenderness.  Abdominal:     General: Abdomen is flat. There is no distension.     Palpations: Abdomen is soft.     Tenderness: There is no abdominal tenderness. There is no right CVA tenderness, left CVA tenderness, guarding or rebound.  Musculoskeletal:        General: Signs of injury (Noted to have abrasions to the left palm and right knee.) present. No swelling or deformity.     Cervical back: Normal range of motion. No rigidity.     Right lower leg: No edema.     Left lower leg: No edema.  Skin:    General: Skin is warm and dry.     Findings: No bruising, erythema or lesion.  Neurological:     General: No focal deficit present.     Mental Status: She is alert and oriented to person, place, and time. Mental status is at baseline.     Sensory: No sensory deficit.     Motor: No weakness.     Comments: No facial asymmetry, no ataxia, no apraxia, no aphasia, no arm drift, normal coordination with finger-to-nose, normal sensation to both upper and lower extremities bilaterally, normal grip strength bilaterally, normal strength to both flexion and extension to both upper lower extremities 5+ bilaterally, no visual field deficits, no nystagmus.   Psychiatric:        Mood and Affect: Mood normal.     (all labs ordered are listed, but only abnormal results are displayed) Labs Reviewed - No data to display  EKG: None  Radiology: CT Head Wo Contrast Result  Date: 01/13/2024 CLINICAL DATA:  Facial trauma, blunt; Neck trauma (Age >= 65y) EXAM: CT HEAD WITHOUT CONTRAST CT CERVICAL SPINE WITHOUT CONTRAST TECHNIQUE: Multidetector CT imaging of the head and cervical spine was performed following the standard protocol without intravenous contrast. Multiplanar CT image reconstructions of the cervical spine were also generated. RADIATION DOSE REDUCTION: This exam was performed according to the departmental dose-optimization program which includes automated exposure control, adjustment of the mA and/or kV according to patient size and/or use of iterative reconstruction technique. COMPARISON:  None Available. FINDINGS: CT  HEAD FINDINGS Brain: No evidence of acute infarction, hemorrhage, hydrocephalus, extra-axial collection or mass lesion/mass effect. Vascular: No hyperdense vessel. Skull: No acute fracture. Sinuses/Orbits: Opacified right ethmoid air cell and partially imaged left maxillary sinus. No acute orbital findings. Other: No mastoid effusions. CT CERVICAL SPINE FINDINGS Alignment: Grade 1 degenerative anterolisthesis of C4 on C5 with severe left facet arthropathy at this level. Skull base and vertebrae: No acute fracture. C6-C7 ACDF with solid bony fusion. Soft tissues and spinal canal: No prevertebral fluid or swelling. No visible canal hematoma. Disc levels: Severe left-sided facet arthropathy at C4-C5. Disc bulging at multiple levels. Upper chest: Lung apices are clear. IMPRESSION: 1. No evidence of acute intracranial abnormality. 2. No evidence of acute fracture or traumatic malalignment in the cervical spine. 3. 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 Electronically Signed   By: Gilmore GORMAN Molt M.D.   On: 01/13/2024 21:25   CT Cervical Spine Wo Contrast Result Date: 01/13/2024 CLINICAL DATA:  Facial trauma, blunt; Neck trauma (Age >= 65y) EXAM: CT HEAD WITHOUT CONTRAST CT CERVICAL SPINE WITHOUT CONTRAST TECHNIQUE:  Multidetector CT imaging of the head and cervical spine was performed following the standard protocol without intravenous contrast. Multiplanar CT image reconstructions of the cervical spine were also generated. RADIATION DOSE REDUCTION: This exam was performed according to the departmental dose-optimization program which includes automated exposure control, adjustment of the mA and/or kV according to patient size and/or use of iterative reconstruction technique. COMPARISON:  None Available. FINDINGS: CT HEAD FINDINGS Brain: No evidence of acute infarction, hemorrhage, hydrocephalus, extra-axial collection or mass lesion/mass effect. Vascular: No hyperdense vessel. Skull: No acute fracture. Sinuses/Orbits: Opacified right ethmoid air cell and partially imaged left maxillary sinus. No acute orbital findings. Other: No mastoid effusions. CT CERVICAL SPINE FINDINGS Alignment: Grade 1 degenerative anterolisthesis of C4 on C5 with severe left facet arthropathy at this level. Skull base and vertebrae: No acute fracture. C6-C7 ACDF with solid bony fusion. Soft tissues and spinal canal: No prevertebral fluid or swelling. No visible canal hematoma. Disc levels: Severe left-sided facet arthropathy at C4-C5. Disc bulging at multiple levels. Upper chest: Lung apices are clear. IMPRESSION: 1. No evidence of acute intracranial abnormality. 2. No evidence of acute fracture or traumatic malalignment in the cervical spine. 3. 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 Electronically Signed   By: Gilmore GORMAN Molt M.D.   On: 01/13/2024 21:25   Procedures   Medications Ordered in the ED - No data to display  Medical Decision Making  This patient is a 78 year old female who presents to the ED for concern of mechanical fall earlier today hit her left side of her face.  Noted abrasions to left palm and right knee.  Has been ambulating and has had no other red flag symptoms.  Not on blood  thinners.  On physical exam, patient is in no acute distress, afebrile, alert and orient x 4, speaking in full sentences, nontachypneic, nontachycardic.  Normal neuroexam.  Abrasions with bleeding controlled.  No injuries noted to the oropharynx, nasal passages clear without signs of injury.  CT scans were done not show any acute abnormalities.  Did show some orthostasis however does not have any signs of radiculopathy or myelopathy at this time.  Will have her continue to follow-up with PCP as needed.  Dressing were changed for her wound on her left palm.  Not suturable at this time.  Area was cleaned.  Patient vital signs have remained stable throughout  the course of patient's time in the ED. Low suspicion for any other emergent pathology at this time. I believe this patient is safe to be discharged. Provided strict return to ER precautions. Patient expressed agreement and understanding of plan. All questions were answered.  Differential diagnoses prior to evaluation: The emergent differential diagnosis includes, but is not limited to, fracture, ligamentous injury, neurovascular injury, dislocation, malalignment, intracranial bleed. This is not an exhaustive differential.   Past Medical History / Co-morbidities / Social History: diabetes, arthritis, HLD, HTN, CAD, Takotsubo cardiomyopathy, heart failure, TIA  Additional history: Chart reviewed. Pertinent results include:     Lab Tests/Imaging studies: I personally interpreted labs/imaging and the pertinent results include:  . CT head shows no acute intracranial abnormality.  CTs of her spine does show severe left-sided facet arthropathy as well as C4-C5 with grade 1 arthrodesis  I agree with the radiologist interpretation.   Medications:  I have reviewed the patients home medicines and have made adjustments as needed.  Critical Interventions: None  Social Determinants of Health: None  Disposition: After consideration of the  diagnostic results and the patients response to treatment, I feel that the patient would benefit from discharge and treatment.SABRA   emergency department workup does not suggest an emergent condition requiring admission or immediate intervention beyond what has been performed at this time. The plan is: Follow-up with PCP as needed, Tylenol  ibuprofen for pain management, return for new or worsening symptoms. The patient is safe for discharge and has been instructed to return immediately for worsening symptoms, change in symptoms or any other concerns.   Final diagnoses:  Fall, initial encounter  Abrasions of multiple sites    ED Discharge Orders     None          Kayveon Lennartz S, PA-C 01/14/24 0105    Theadore Ozell HERO, MD 01/14/24 [phone]

## 2024-01-14 NOTE — Discharge Instructions (Addendum)
 You are seen today after mechanical fall earlier.  CT scans were reassuring, did note some arthritis in the neck.  Recommend to continue to follow-up with primary care for possible neurosurgery or neurology follow-up as needed.  Be sure to change bandages at least once a day, clean with soap and water and monitoring for signs of infection which include fever, swelling, foul-smelling, white drainage, uncontrollable pain.  If present please return to ED.  Also if you develop any confusion, one-sided weakness, seizure-like activity, please return to the ED for further evaluation.  Take Tylenol  (acetominophen)  650mg  every 4-6 hours, as needed for pain or fever. Do not take more than 4,000 mg in a 24-hour period. As this may cause liver damage. While this is rare, if you begin to develop yellowing of the skin or eyes, stop taking and return to ER immediately.  Take Ibuprofen 400mg  every 4-6 hours for pain or fever, not exceeding 3,200 mg per day as more than 3,200mg  can cause Stomach irritation, dizziness, kidney issues with long-term use.

## 2024-01-17 DIAGNOSIS — I5042 Chronic combined systolic (congestive) and diastolic (congestive) heart failure: Secondary | ICD-10-CM | POA: Diagnosis not present

## 2024-01-17 DIAGNOSIS — E1122 Type 2 diabetes mellitus with diabetic chronic kidney disease: Secondary | ICD-10-CM | POA: Diagnosis not present

## 2024-01-19 DIAGNOSIS — S60222A Contusion of left hand, initial encounter: Secondary | ICD-10-CM | POA: Diagnosis not present

## 2024-01-19 DIAGNOSIS — S0083XA Contusion of other part of head, initial encounter: Secondary | ICD-10-CM | POA: Diagnosis not present

## 2024-01-19 DIAGNOSIS — S8001XA Contusion of right knee, initial encounter: Secondary | ICD-10-CM | POA: Diagnosis not present

## 2024-01-19 DIAGNOSIS — G3184 Mild cognitive impairment, so stated: Secondary | ICD-10-CM | POA: Diagnosis not present

## 2024-01-19 DIAGNOSIS — W19XXXD Unspecified fall, subsequent encounter: Secondary | ICD-10-CM | POA: Diagnosis not present

## 2024-01-19 DIAGNOSIS — Z23 Encounter for immunization: Secondary | ICD-10-CM | POA: Diagnosis not present

## 2024-01-26 DIAGNOSIS — M25642 Stiffness of left hand, not elsewhere classified: Secondary | ICD-10-CM | POA: Diagnosis not present

## 2024-01-26 DIAGNOSIS — M6281 Muscle weakness (generalized): Secondary | ICD-10-CM | POA: Diagnosis not present

## 2024-01-26 DIAGNOSIS — M79645 Pain in left finger(s): Secondary | ICD-10-CM | POA: Diagnosis not present

## 2024-01-26 DIAGNOSIS — M65322 Trigger finger, left index finger: Secondary | ICD-10-CM | POA: Diagnosis not present

## 2024-01-27 DIAGNOSIS — I2510 Atherosclerotic heart disease of native coronary artery without angina pectoris: Secondary | ICD-10-CM | POA: Diagnosis not present

## 2024-01-27 DIAGNOSIS — M069 Rheumatoid arthritis, unspecified: Secondary | ICD-10-CM | POA: Diagnosis not present

## 2024-01-27 DIAGNOSIS — M79645 Pain in left finger(s): Secondary | ICD-10-CM | POA: Diagnosis not present

## 2024-01-27 DIAGNOSIS — I5022 Chronic systolic (congestive) heart failure: Secondary | ICD-10-CM | POA: Diagnosis not present

## 2024-01-27 DIAGNOSIS — M25642 Stiffness of left hand, not elsewhere classified: Secondary | ICD-10-CM | POA: Diagnosis not present

## 2024-01-27 DIAGNOSIS — M65322 Trigger finger, left index finger: Secondary | ICD-10-CM | POA: Diagnosis not present

## 2024-01-27 DIAGNOSIS — E119 Type 2 diabetes mellitus without complications: Secondary | ICD-10-CM | POA: Diagnosis not present

## 2024-01-27 DIAGNOSIS — M6281 Muscle weakness (generalized): Secondary | ICD-10-CM | POA: Diagnosis not present

## 2024-02-02 DIAGNOSIS — M25642 Stiffness of left hand, not elsewhere classified: Secondary | ICD-10-CM | POA: Diagnosis not present

## 2024-02-02 DIAGNOSIS — M6281 Muscle weakness (generalized): Secondary | ICD-10-CM | POA: Diagnosis not present

## 2024-02-02 DIAGNOSIS — M65322 Trigger finger, left index finger: Secondary | ICD-10-CM | POA: Diagnosis not present

## 2024-02-02 DIAGNOSIS — M79645 Pain in left finger(s): Secondary | ICD-10-CM | POA: Diagnosis not present

## 2024-02-03 DIAGNOSIS — M6281 Muscle weakness (generalized): Secondary | ICD-10-CM | POA: Diagnosis not present

## 2024-02-03 DIAGNOSIS — M25642 Stiffness of left hand, not elsewhere classified: Secondary | ICD-10-CM | POA: Diagnosis not present

## 2024-02-03 DIAGNOSIS — M79645 Pain in left finger(s): Secondary | ICD-10-CM | POA: Diagnosis not present

## 2024-02-03 DIAGNOSIS — M65322 Trigger finger, left index finger: Secondary | ICD-10-CM | POA: Diagnosis not present

## 2024-02-16 DIAGNOSIS — I5022 Chronic systolic (congestive) heart failure: Secondary | ICD-10-CM | POA: Diagnosis not present

## 2024-02-16 DIAGNOSIS — E1122 Type 2 diabetes mellitus with diabetic chronic kidney disease: Secondary | ICD-10-CM | POA: Diagnosis not present

## 2024-02-16 DIAGNOSIS — I5042 Chronic combined systolic (congestive) and diastolic (congestive) heart failure: Secondary | ICD-10-CM | POA: Diagnosis not present

## 2024-02-16 DIAGNOSIS — M722 Plantar fascial fibromatosis: Secondary | ICD-10-CM | POA: Diagnosis not present

## 2024-02-16 DIAGNOSIS — I129 Hypertensive chronic kidney disease with stage 1 through stage 4 chronic kidney disease, or unspecified chronic kidney disease: Secondary | ICD-10-CM | POA: Diagnosis not present

## 2024-02-17 ENCOUNTER — Encounter: Payer: Self-pay | Admitting: Adult Health

## 2024-02-17 ENCOUNTER — Ambulatory Visit: Payer: Medicare Other | Admitting: Adult Health

## 2024-02-17 VITALS — BP 143/72 | HR 64 | Ht 63.0 in | Wt 145.0 lb

## 2024-02-17 DIAGNOSIS — R531 Weakness: Secondary | ICD-10-CM

## 2024-02-17 DIAGNOSIS — G3184 Mild cognitive impairment, so stated: Secondary | ICD-10-CM | POA: Diagnosis not present
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
## 2024-02-17 NOTE — Patient Instructions (Addendum)
 Your Plan:  Please continue to monitor left facial symptoms, if you want to proceed with further imaging please let me know - if this does not get better or if worsens, please let me know  Continue to monitor your memory - if this should worsen please let me know       Thank you for coming to see us  at [facility]. I hope we have been able to provide you high quality care today.  You may receive a patient satisfaction survey over the next few weeks. We would appreciate your feedback and comments so that we may continue to improve ourselves and the health of our patients.

## 2024-02-25 DIAGNOSIS — M722 Plantar fascial fibromatosis: Secondary | ICD-10-CM | POA: Diagnosis not present

## 2024-02-27 DIAGNOSIS — M069 Rheumatoid arthritis, unspecified: Secondary | ICD-10-CM | POA: Diagnosis not present

## 2024-02-27 DIAGNOSIS — E119 Type 2 diabetes mellitus without complications: Secondary | ICD-10-CM | POA: Diagnosis not present

## 2024-02-27 DIAGNOSIS — E1122 Type 2 diabetes mellitus with diabetic chronic kidney disease: Secondary | ICD-10-CM | POA: Diagnosis not present

## 2024-02-27 DIAGNOSIS — I5042 Chronic combined systolic (congestive) and diastolic (congestive) heart failure: Secondary | ICD-10-CM | POA: Diagnosis not present

## 2024-02-27 DIAGNOSIS — I5022 Chronic systolic (congestive) heart failure: Secondary | ICD-10-CM | POA: Diagnosis not present

## 2024-02-27 DIAGNOSIS — I2510 Atherosclerotic heart disease of native coronary artery without angina pectoris: Secondary | ICD-10-CM | POA: Diagnosis not present

## 2024-02-27 DIAGNOSIS — I129 Hypertensive chronic kidney disease with stage 1 through stage 4 chronic kidney disease, or unspecified chronic kidney disease: Secondary | ICD-10-CM | POA: Diagnosis not present

## 2024-03-16 DIAGNOSIS — M0579 Rheumatoid arthritis with rheumatoid factor of multiple sites without organ or systems involvement: Secondary | ICD-10-CM | POA: Diagnosis not present

## 2024-03-16 DIAGNOSIS — M353 Polymyalgia rheumatica: Secondary | ICD-10-CM | POA: Diagnosis not present

## 2024-03-16 DIAGNOSIS — Z79899 Other long term (current) drug therapy: Secondary | ICD-10-CM | POA: Diagnosis not present

## 2024-03-16 DIAGNOSIS — M1991 Primary osteoarthritis, unspecified site: Secondary | ICD-10-CM | POA: Diagnosis not present

## 2024-03-17 DIAGNOSIS — I5022 Chronic systolic (congestive) heart failure: Secondary | ICD-10-CM | POA: Diagnosis not present

## 2024-03-17 DIAGNOSIS — E1122 Type 2 diabetes mellitus with diabetic chronic kidney disease: Secondary | ICD-10-CM | POA: Diagnosis not present

## 2024-03-17 DIAGNOSIS — I5042 Chronic combined systolic (congestive) and diastolic (congestive) heart failure: Secondary | ICD-10-CM | POA: Diagnosis not present

## 2024-03-17 DIAGNOSIS — I129 Hypertensive chronic kidney disease with stage 1 through stage 4 chronic kidney disease, or unspecified chronic kidney disease: Secondary | ICD-10-CM | POA: Diagnosis not present

## 2024-03-22 DIAGNOSIS — M654 Radial styloid tenosynovitis [de Quervain]: Secondary | ICD-10-CM | POA: Diagnosis not present

## 2024-03-28 DIAGNOSIS — I5022 Chronic systolic (congestive) heart failure: Secondary | ICD-10-CM | POA: Diagnosis not present

## 2024-03-28 DIAGNOSIS — E119 Type 2 diabetes mellitus without complications: Secondary | ICD-10-CM | POA: Diagnosis not present

## 2024-03-28 DIAGNOSIS — I2510 Atherosclerotic heart disease of native coronary artery without angina pectoris: Secondary | ICD-10-CM | POA: Diagnosis not present

## 2024-03-28 DIAGNOSIS — M069 Rheumatoid arthritis, unspecified: Secondary | ICD-10-CM | POA: Diagnosis not present

## 2024-04-08 DIAGNOSIS — N1832 Chronic kidney disease, stage 3b: Secondary | ICD-10-CM | POA: Diagnosis not present

## 2024-04-08 DIAGNOSIS — E1122 Type 2 diabetes mellitus with diabetic chronic kidney disease: Secondary | ICD-10-CM | POA: Diagnosis not present

## 2024-04-08 DIAGNOSIS — I129 Hypertensive chronic kidney disease with stage 1 through stage 4 chronic kidney disease, or unspecified chronic kidney disease: Secondary | ICD-10-CM | POA: Diagnosis not present

## 2024-04-08 DIAGNOSIS — E785 Hyperlipidemia, unspecified: Secondary | ICD-10-CM | POA: Diagnosis not present

## 2024-04-23 ENCOUNTER — Other Ambulatory Visit: Payer: Self-pay | Admitting: Nurse Practitioner

## 2024-04-23 DIAGNOSIS — N83201 Unspecified ovarian cyst, right side: Secondary | ICD-10-CM

## 2024-05-25 ENCOUNTER — Other Ambulatory Visit: Payer: Self-pay | Admitting: Cardiology

## 2024-05-31 NOTE — Telephone Encounter (Signed)
 Labs on 03/16/24 outside Normal Range  In accordance with refill protocols, please review and address the following requirements before this medication refill can be authorized:  Labs

## 2024-07-19 ENCOUNTER — Ambulatory Visit: Admitting: Cardiology
